# Patient Record
Sex: Female | Born: 1975
Health system: Southern US, Community
[De-identification: ages and names within clinical notes are randomized; demographics above are authoritative.]

## PROBLEM LIST (undated history)

## (undated) DIAGNOSIS — H409 Unspecified glaucoma: Secondary | ICD-10-CM

## (undated) DIAGNOSIS — E039 Hypothyroidism, unspecified: Secondary | ICD-10-CM

## (undated) DIAGNOSIS — K219 Gastro-esophageal reflux disease without esophagitis: Secondary | ICD-10-CM

## (undated) DIAGNOSIS — D649 Anemia, unspecified: Secondary | ICD-10-CM

## (undated) DIAGNOSIS — F419 Anxiety disorder, unspecified: Secondary | ICD-10-CM

## (undated) DIAGNOSIS — K589 Irritable bowel syndrome without diarrhea: Secondary | ICD-10-CM

## (undated) DIAGNOSIS — E079 Disorder of thyroid, unspecified: Secondary | ICD-10-CM

## (undated) DIAGNOSIS — R51 Headache: Secondary | ICD-10-CM

## (undated) DIAGNOSIS — F32A Depression, unspecified: Secondary | ICD-10-CM

## (undated) DIAGNOSIS — N390 Urinary tract infection, site not specified: Secondary | ICD-10-CM

## (undated) DIAGNOSIS — G2581 Restless legs syndrome: Secondary | ICD-10-CM

## (undated) DIAGNOSIS — D352 Benign neoplasm of pituitary gland: Secondary | ICD-10-CM

## (undated) DIAGNOSIS — G4700 Insomnia, unspecified: Secondary | ICD-10-CM

## (undated) DIAGNOSIS — F329 Major depressive disorder, single episode, unspecified: Secondary | ICD-10-CM

## (undated) DIAGNOSIS — E229 Hyperfunction of pituitary gland, unspecified: Secondary | ICD-10-CM

## (undated) HISTORY — DX: Benign neoplasm of pituitary gland: D35.2

## (undated) HISTORY — DX: Hyperfunction of pituitary gland, unspecified: E22.9

## (undated) HISTORY — PX: TONSILLECTOMY: SUR1361

## (undated) HISTORY — PX: TUBAL LIGATION: SHX77

## (undated) HISTORY — DX: Disorder of thyroid, unspecified: E07.9

## (undated) HISTORY — DX: Urinary tract infection, site not specified: N39.0

## (undated) HISTORY — DX: Restless legs syndrome: G25.81

## (undated) HISTORY — DX: Anemia, unspecified: D64.9

## (undated) HISTORY — DX: Hypothyroidism, unspecified: E03.9

## (undated) HISTORY — DX: Major depressive disorder, single episode, unspecified: F32.9

## (undated) HISTORY — DX: Unspecified glaucoma: H40.9

## (undated) HISTORY — DX: Gastro-esophageal reflux disease without esophagitis: K21.9

## (undated) HISTORY — DX: Anxiety disorder, unspecified: F41.9

## (undated) HISTORY — DX: Depression, unspecified: F32.A

## (undated) HISTORY — DX: Insomnia, unspecified: G47.00

## (undated) HISTORY — DX: Irritable bowel syndrome, unspecified: K58.9

## (undated) HISTORY — DX: Headache: R51

## (undated) HISTORY — PX: WISDOM TOOTH EXTRACTION: SHX21

---

## 1994-12-09 HISTORY — PX: NECK SURGERY: SHX720

## 1997-09-01 ENCOUNTER — Inpatient Hospital Stay (HOSPITAL_COMMUNITY): Admission: AD | Admit: 1997-09-01 | Discharge: 1997-09-01 | Payer: Self-pay | Admitting: Obstetrics and Gynecology

## 1997-09-02 ENCOUNTER — Inpatient Hospital Stay (HOSPITAL_COMMUNITY): Admission: AD | Admit: 1997-09-02 | Discharge: 1997-09-02 | Payer: Self-pay | Admitting: Obstetrics and Gynecology

## 1997-10-01 ENCOUNTER — Inpatient Hospital Stay (HOSPITAL_COMMUNITY): Admission: AD | Admit: 1997-10-01 | Discharge: 1997-10-01 | Payer: Self-pay | Admitting: Obstetrics and Gynecology

## 1997-10-23 ENCOUNTER — Inpatient Hospital Stay (HOSPITAL_COMMUNITY): Admission: AD | Admit: 1997-10-23 | Discharge: 1997-10-23 | Payer: Self-pay | Admitting: Obstetrics and Gynecology

## 1997-10-29 ENCOUNTER — Inpatient Hospital Stay (HOSPITAL_COMMUNITY): Admission: AD | Admit: 1997-10-29 | Discharge: 1997-10-29 | Payer: Self-pay | Admitting: *Deleted

## 1997-11-10 ENCOUNTER — Inpatient Hospital Stay (HOSPITAL_COMMUNITY): Admission: AD | Admit: 1997-11-10 | Discharge: 1997-11-10 | Payer: Self-pay | Admitting: Obstetrics and Gynecology

## 1997-11-19 ENCOUNTER — Inpatient Hospital Stay (HOSPITAL_COMMUNITY): Admission: AD | Admit: 1997-11-19 | Discharge: 1997-11-21 | Payer: Self-pay | Admitting: Obstetrics and Gynecology

## 1997-12-04 ENCOUNTER — Inpatient Hospital Stay (HOSPITAL_COMMUNITY): Admission: AD | Admit: 1997-12-04 | Discharge: 1997-12-04 | Payer: Self-pay | Admitting: Obstetrics & Gynecology

## 1997-12-10 ENCOUNTER — Inpatient Hospital Stay (HOSPITAL_COMMUNITY): Admission: AD | Admit: 1997-12-10 | Discharge: 1997-12-10 | Payer: Self-pay | Admitting: Obstetrics and Gynecology

## 1997-12-22 ENCOUNTER — Inpatient Hospital Stay (HOSPITAL_COMMUNITY): Admission: AD | Admit: 1997-12-22 | Discharge: 1997-12-24 | Payer: Self-pay | Admitting: Obstetrics and Gynecology

## 1998-02-06 ENCOUNTER — Other Ambulatory Visit: Admission: RE | Admit: 1998-02-06 | Discharge: 1998-02-06 | Payer: Self-pay | Admitting: Obstetrics and Gynecology

## 1998-05-08 ENCOUNTER — Emergency Department (HOSPITAL_COMMUNITY): Admission: EM | Admit: 1998-05-08 | Discharge: 1998-05-09 | Payer: Self-pay | Admitting: Emergency Medicine

## 1998-05-09 ENCOUNTER — Emergency Department (HOSPITAL_COMMUNITY): Admission: EM | Admit: 1998-05-09 | Discharge: 1998-05-10 | Payer: Self-pay

## 1998-05-09 ENCOUNTER — Encounter: Payer: Self-pay | Admitting: Emergency Medicine

## 1998-05-10 ENCOUNTER — Encounter: Payer: Self-pay | Admitting: Emergency Medicine

## 1998-05-10 ENCOUNTER — Ambulatory Visit (HOSPITAL_COMMUNITY): Admission: RE | Admit: 1998-05-10 | Discharge: 1998-05-10 | Payer: Self-pay | Admitting: Emergency Medicine

## 1998-05-10 ENCOUNTER — Encounter: Payer: Self-pay | Admitting: Gastroenterology

## 1998-05-12 ENCOUNTER — Inpatient Hospital Stay (HOSPITAL_COMMUNITY): Admission: AD | Admit: 1998-05-12 | Discharge: 1998-05-15 | Payer: Self-pay | Admitting: Internal Medicine

## 1998-05-12 ENCOUNTER — Encounter: Payer: Self-pay | Admitting: Internal Medicine

## 1998-05-13 ENCOUNTER — Encounter: Payer: Self-pay | Admitting: Surgery

## 1998-05-14 ENCOUNTER — Encounter: Payer: Self-pay | Admitting: Internal Medicine

## 1999-03-30 ENCOUNTER — Other Ambulatory Visit: Admission: RE | Admit: 1999-03-30 | Discharge: 1999-03-30 | Payer: Self-pay | Admitting: Obstetrics and Gynecology

## 1999-10-07 ENCOUNTER — Observation Stay (HOSPITAL_COMMUNITY): Admission: AD | Admit: 1999-10-07 | Discharge: 1999-10-08 | Payer: Self-pay | Admitting: Obstetrics and Gynecology

## 1999-11-17 ENCOUNTER — Inpatient Hospital Stay (HOSPITAL_COMMUNITY): Admission: AD | Admit: 1999-11-17 | Discharge: 1999-11-17 | Payer: Self-pay | Admitting: Obstetrics and Gynecology

## 1999-12-05 ENCOUNTER — Inpatient Hospital Stay (HOSPITAL_COMMUNITY): Admission: AD | Admit: 1999-12-05 | Discharge: 1999-12-07 | Payer: Self-pay | Admitting: Obstetrics & Gynecology

## 2000-08-02 ENCOUNTER — Emergency Department (HOSPITAL_COMMUNITY): Admission: EM | Admit: 2000-08-02 | Discharge: 2000-08-02 | Payer: Self-pay | Admitting: Emergency Medicine

## 2000-11-19 ENCOUNTER — Encounter: Payer: Self-pay | Admitting: Otolaryngology

## 2000-11-19 ENCOUNTER — Encounter: Admission: RE | Admit: 2000-11-19 | Discharge: 2000-11-19 | Payer: Self-pay | Admitting: Otolaryngology

## 2001-02-26 ENCOUNTER — Other Ambulatory Visit: Admission: RE | Admit: 2001-02-26 | Discharge: 2001-02-26 | Payer: Self-pay | Admitting: Obstetrics and Gynecology

## 2001-11-06 ENCOUNTER — Encounter: Payer: Self-pay | Admitting: Gastroenterology

## 2001-11-06 ENCOUNTER — Encounter: Admission: RE | Admit: 2001-11-06 | Discharge: 2001-11-06 | Payer: Self-pay | Admitting: Gastroenterology

## 2001-12-30 ENCOUNTER — Encounter: Admission: RE | Admit: 2001-12-30 | Discharge: 2001-12-30 | Payer: Self-pay | Admitting: Internal Medicine

## 2001-12-30 ENCOUNTER — Encounter: Payer: Self-pay | Admitting: Internal Medicine

## 2002-01-05 ENCOUNTER — Encounter: Admission: RE | Admit: 2002-01-05 | Discharge: 2002-03-23 | Payer: Self-pay | Admitting: Internal Medicine

## 2002-04-06 ENCOUNTER — Encounter: Admission: RE | Admit: 2002-04-06 | Discharge: 2002-07-05 | Payer: Self-pay | Admitting: Internal Medicine

## 2002-05-26 ENCOUNTER — Encounter: Payer: Self-pay | Admitting: Family Medicine

## 2002-05-26 ENCOUNTER — Encounter: Admission: RE | Admit: 2002-05-26 | Discharge: 2002-05-26 | Payer: Self-pay | Admitting: Family Medicine

## 2003-07-07 ENCOUNTER — Observation Stay (HOSPITAL_COMMUNITY): Admission: RE | Admit: 2003-07-07 | Discharge: 2003-07-08 | Payer: Self-pay | Admitting: Gastroenterology

## 2003-10-05 ENCOUNTER — Other Ambulatory Visit: Admission: RE | Admit: 2003-10-05 | Discharge: 2003-10-05 | Payer: Self-pay | Admitting: Internal Medicine

## 2004-01-02 ENCOUNTER — Ambulatory Visit (HOSPITAL_BASED_OUTPATIENT_CLINIC_OR_DEPARTMENT_OTHER): Admission: RE | Admit: 2004-01-02 | Discharge: 2004-01-02 | Payer: Self-pay | Admitting: Otolaryngology

## 2004-01-02 ENCOUNTER — Encounter (INDEPENDENT_AMBULATORY_CARE_PROVIDER_SITE_OTHER): Payer: Self-pay | Admitting: *Deleted

## 2004-01-02 ENCOUNTER — Ambulatory Visit (HOSPITAL_COMMUNITY): Admission: RE | Admit: 2004-01-02 | Discharge: 2004-01-02 | Payer: Self-pay | Admitting: Otolaryngology

## 2005-02-28 ENCOUNTER — Encounter: Admission: RE | Admit: 2005-02-28 | Discharge: 2005-02-28 | Payer: Self-pay | Admitting: Gastroenterology

## 2005-03-28 ENCOUNTER — Ambulatory Visit: Payer: Self-pay | Admitting: Family Medicine

## 2005-04-01 ENCOUNTER — Ambulatory Visit: Payer: Self-pay | Admitting: Cardiology

## 2005-04-16 ENCOUNTER — Ambulatory Visit: Payer: Self-pay | Admitting: Family Medicine

## 2005-04-24 ENCOUNTER — Ambulatory Visit: Payer: Self-pay | Admitting: Family Medicine

## 2005-05-07 ENCOUNTER — Ambulatory Visit: Payer: Self-pay | Admitting: Family Medicine

## 2005-07-15 ENCOUNTER — Ambulatory Visit: Payer: Self-pay | Admitting: Family Medicine

## 2005-08-01 ENCOUNTER — Ambulatory Visit: Payer: Self-pay | Admitting: Internal Medicine

## 2005-08-26 ENCOUNTER — Encounter (INDEPENDENT_AMBULATORY_CARE_PROVIDER_SITE_OTHER): Payer: Self-pay | Admitting: Specialist

## 2005-08-26 ENCOUNTER — Ambulatory Visit: Payer: Self-pay | Admitting: Internal Medicine

## 2005-09-25 ENCOUNTER — Ambulatory Visit: Payer: Self-pay | Admitting: Internal Medicine

## 2005-10-01 ENCOUNTER — Ambulatory Visit: Payer: Self-pay | Admitting: Family Medicine

## 2005-11-26 ENCOUNTER — Ambulatory Visit: Payer: Self-pay | Admitting: Family Medicine

## 2005-11-26 ENCOUNTER — Other Ambulatory Visit: Admission: RE | Admit: 2005-11-26 | Discharge: 2005-11-26 | Payer: Self-pay | Admitting: Family Medicine

## 2005-11-26 ENCOUNTER — Encounter: Payer: Self-pay | Admitting: Family Medicine

## 2005-12-23 ENCOUNTER — Observation Stay (HOSPITAL_COMMUNITY): Admission: EM | Admit: 2005-12-23 | Discharge: 2005-12-23 | Payer: Self-pay | Admitting: Emergency Medicine

## 2005-12-23 ENCOUNTER — Ambulatory Visit: Payer: Self-pay | Admitting: Endocrinology

## 2006-04-25 ENCOUNTER — Ambulatory Visit: Payer: Self-pay | Admitting: Family Medicine

## 2006-05-09 ENCOUNTER — Ambulatory Visit: Payer: Self-pay | Admitting: Family Medicine

## 2006-07-11 ENCOUNTER — Ambulatory Visit: Payer: Self-pay | Admitting: Family Medicine

## 2006-10-03 ENCOUNTER — Ambulatory Visit (HOSPITAL_COMMUNITY): Admission: RE | Admit: 2006-10-03 | Discharge: 2006-10-03 | Payer: Self-pay | Admitting: Obstetrics and Gynecology

## 2006-10-03 ENCOUNTER — Encounter (INDEPENDENT_AMBULATORY_CARE_PROVIDER_SITE_OTHER): Payer: Self-pay | Admitting: Specialist

## 2007-01-17 ENCOUNTER — Emergency Department (HOSPITAL_COMMUNITY): Admission: EM | Admit: 2007-01-17 | Discharge: 2007-01-17 | Payer: Self-pay | Admitting: Family Medicine

## 2007-04-11 HISTORY — PX: ABDOMINAL HYSTERECTOMY: SHX81

## 2007-04-16 ENCOUNTER — Inpatient Hospital Stay (HOSPITAL_COMMUNITY): Admission: RE | Admit: 2007-04-16 | Discharge: 2007-04-18 | Payer: Self-pay | Admitting: Obstetrics and Gynecology

## 2007-04-16 ENCOUNTER — Encounter (INDEPENDENT_AMBULATORY_CARE_PROVIDER_SITE_OTHER): Payer: Self-pay | Admitting: Obstetrics and Gynecology

## 2007-11-03 ENCOUNTER — Emergency Department (HOSPITAL_COMMUNITY): Admission: EM | Admit: 2007-11-03 | Discharge: 2007-11-03 | Payer: Self-pay | Admitting: Family Medicine

## 2008-01-15 ENCOUNTER — Ambulatory Visit: Payer: Self-pay | Admitting: Family Medicine

## 2008-01-15 DIAGNOSIS — G4700 Insomnia, unspecified: Secondary | ICD-10-CM

## 2008-01-15 DIAGNOSIS — R51 Headache: Secondary | ICD-10-CM | POA: Insufficient documentation

## 2008-01-15 DIAGNOSIS — R519 Headache, unspecified: Secondary | ICD-10-CM | POA: Insufficient documentation

## 2008-01-15 DIAGNOSIS — Z9189 Other specified personal risk factors, not elsewhere classified: Secondary | ICD-10-CM | POA: Insufficient documentation

## 2008-01-15 DIAGNOSIS — F411 Generalized anxiety disorder: Secondary | ICD-10-CM | POA: Insufficient documentation

## 2008-01-15 DIAGNOSIS — N390 Urinary tract infection, site not specified: Secondary | ICD-10-CM

## 2008-01-15 DIAGNOSIS — K219 Gastro-esophageal reflux disease without esophagitis: Secondary | ICD-10-CM

## 2008-02-04 ENCOUNTER — Ambulatory Visit: Payer: Self-pay | Admitting: Licensed Clinical Social Worker

## 2008-02-25 ENCOUNTER — Ambulatory Visit: Payer: Self-pay | Admitting: Family Medicine

## 2008-02-25 DIAGNOSIS — R1032 Left lower quadrant pain: Secondary | ICD-10-CM

## 2008-02-25 DIAGNOSIS — K589 Irritable bowel syndrome without diarrhea: Secondary | ICD-10-CM | POA: Insufficient documentation

## 2008-02-25 LAB — CONVERTED CEMR LAB
Protein, U semiquant: NEGATIVE
Specific Gravity, Urine: >=1.03

## 2008-02-26 ENCOUNTER — Encounter: Admission: RE | Admit: 2008-02-26 | Discharge: 2008-02-26 | Payer: Self-pay | Admitting: Family Medicine

## 2008-04-26 ENCOUNTER — Ambulatory Visit: Payer: Self-pay | Admitting: Family Medicine

## 2008-04-26 DIAGNOSIS — M461 Sacroiliitis, not elsewhere classified: Secondary | ICD-10-CM

## 2008-05-30 ENCOUNTER — Ambulatory Visit: Payer: Self-pay | Admitting: Family Medicine

## 2008-05-30 ENCOUNTER — Telehealth: Payer: Self-pay | Admitting: Family Medicine

## 2008-06-07 ENCOUNTER — Ambulatory Visit: Payer: Self-pay | Admitting: Family Medicine

## 2008-06-07 LAB — CONVERTED CEMR LAB
Bilirubin Urine: NEGATIVE
Ketones, urine, test strip: NEGATIVE
Nitrite: NEGATIVE
Urobilinogen, UA: 0.2
pH: 5

## 2008-06-09 LAB — CONVERTED CEMR LAB
AST: 14 units/L (ref 0–37)
Albumin: 3.8 g/dL (ref 3.5–5.2)
BUN: 11 mg/dL (ref 6–23)
Basophils Absolute: 0 10*3/uL (ref 0.0–0.1)
Calcium: 9.5 mg/dL (ref 8.4–10.5)
Cholesterol: 229 mg/dL (ref 0–200)
Eosinophils Absolute: 0.1 10*3/uL (ref 0.0–0.7)
GFR calc Af Amer: 124 mL/min
GFR calc non Af Amer: 102 mL/min
HCT: 39.8 % (ref 36.0–46.0)
Hemoglobin: 13.4 g/dL (ref 12.0–15.0)
Lymphocytes Relative: 37.4 % (ref 12.0–46.0)
MCHC: 33.6 g/dL (ref 30.0–36.0)
Neutrophils Relative %: 50.4 % (ref 43.0–77.0)
Potassium: 4.3 meq/L (ref 3.5–5.1)
RDW: 11.7 % (ref 11.5–14.6)
TSH: 3.74 microintl units/mL (ref 0.35–5.50)
Total Bilirubin: 0.6 mg/dL (ref 0.3–1.2)
Total CHOL/HDL Ratio: 3.3
VLDL: 25 mg/dL (ref 0–40)
WBC: 3.3 10*3/uL — ABNORMAL LOW (ref 4.5–10.5)

## 2008-06-14 ENCOUNTER — Ambulatory Visit: Payer: Self-pay | Admitting: Family Medicine

## 2009-01-13 ENCOUNTER — Encounter: Payer: Self-pay | Admitting: Family Medicine

## 2009-02-01 ENCOUNTER — Telehealth: Payer: Self-pay | Admitting: Family Medicine

## 2009-02-10 ENCOUNTER — Ambulatory Visit: Payer: Self-pay | Admitting: Family Medicine

## 2009-02-10 DIAGNOSIS — J309 Allergic rhinitis, unspecified: Secondary | ICD-10-CM | POA: Insufficient documentation

## 2009-06-26 ENCOUNTER — Encounter: Admission: RE | Admit: 2009-06-26 | Discharge: 2009-06-26 | Payer: Self-pay | Admitting: Obstetrics and Gynecology

## 2010-02-06 ENCOUNTER — Ambulatory Visit: Payer: Self-pay | Admitting: Family Medicine

## 2010-02-06 DIAGNOSIS — J019 Acute sinusitis, unspecified: Secondary | ICD-10-CM | POA: Insufficient documentation

## 2010-04-18 ENCOUNTER — Ambulatory Visit: Payer: Self-pay | Admitting: Family Medicine

## 2010-04-18 DIAGNOSIS — R55 Syncope and collapse: Secondary | ICD-10-CM

## 2010-04-20 LAB — CONVERTED CEMR LAB
Alkaline Phosphatase: 46 units/L (ref 39–117)
BUN: 12 mg/dL (ref 6–23)
Basophils Absolute: 0 10*3/uL (ref 0.0–0.1)
Bilirubin, Direct: 0 mg/dL (ref 0.0–0.3)
Calcium: 9.5 mg/dL (ref 8.4–10.5)
Chloride: 108 meq/L (ref 96–112)
Creatinine, Ser: 0.8 mg/dL (ref 0.4–1.2)
Eosinophils Absolute: 0 10*3/uL (ref 0.0–0.7)
Eosinophils Relative: 0.8 % (ref 0.0–5.0)
Glucose, Bld: 83 mg/dL (ref 70–99)
HCT: 36.7 % (ref 36.0–46.0)
Monocytes Relative: 7.2 % (ref 3.0–12.0)
Neutro Abs: 2.4 10*3/uL (ref 1.4–7.7)
Neutrophils Relative %: 54.6 % (ref 43.0–77.0)
Platelets: 213 10*3/uL (ref 150.0–400.0)
TSH: 1.74 microintl units/mL (ref 0.35–5.50)
Total Bilirubin: 0.3 mg/dL (ref 0.3–1.2)

## 2010-07-02 ENCOUNTER — Encounter: Payer: Self-pay | Admitting: Family Medicine

## 2010-07-10 NOTE — Assessment & Plan Note (Signed)
Summary: CONGESTION // RS   Vital Signs:  Patient profile:   35 year old female Weight:      161 pounds BMI:     24.57 O2 Sat:      99 % Temp:     98.7 degrees F Pulse rate:   98 / minute BP sitting:   110 / 78  (left arm)  Vitals Entered By: Pura Spice, RN (February 06, 2010 11:19 AM) CC: sinus congestion cough Is Patient Diabetic? No   History of Present Illness: Here for one week of sinus pressure, HA, ST, and a dry cough. No fever.   Allergies: 1)  ! Penicillin 2)  ! * Dilaudid 3)  ! * Mgso4 4)  ! * Ct Scan Dye  Past History:  Past Medical History: Reviewed history from 06/14/2008 and no changes required. IBS, sees Dr. Marina Goodell Depression GERD restless legs insomnia Headache frequent UTI's, sees Dr. Vernie Ammons sees Dr. Duane Lope for GYN exams  Review of Systems  The patient denies anorexia, fever, weight loss, weight gain, vision loss, decreased hearing, hoarseness, chest pain, syncope, dyspnea on exertion, peripheral edema, hemoptysis, abdominal pain, melena, hematochezia, severe indigestion/heartburn, hematuria, incontinence, genital sores, muscle weakness, suspicious skin lesions, transient blindness, difficulty walking, depression, unusual weight change, abnormal bleeding, enlarged lymph nodes, angioedema, breast masses, and testicular masses.    Physical Exam  General:  Well-developed,well-nourished,in no acute distress; alert,appropriate and cooperative throughout examination Head:  Normocephalic and atraumatic without obvious abnormalities. No apparent alopecia or balding. Eyes:  No corneal or conjunctival inflammation noted. EOMI. Perrla. Funduscopic exam benign, without hemorrhages, exudates or papilledema. Vision grossly normal. Ears:  External ear exam shows no significant lesions or deformities.  Otoscopic examination reveals clear canals, tympanic membranes are intact bilaterally without bulging, retraction, inflammation or discharge. Hearing is grossly  normal bilaterally. Nose:  External nasal examination shows no deformity or inflammation. Nasal mucosa are pink and moist without lesions or exudates. Mouth:  Oral mucosa and oropharynx without lesions or exudates.  Teeth in good repair. Neck:  No deformities, masses, or tenderness noted. Lungs:  Normal respiratory effort, chest expands symmetrically. Lungs are clear to auscultation, no crackles or wheezes.   Impression & Recommendations:  Problem # 1:  ACUTE SINUSITIS, UNSPECIFIED (ICD-461.9)  The following medications were removed from the medication list:    Flonase 50 Mcg/act Susp (Fluticasone propionate) .Marland Kitchen... 2 sprays each nostril once daily    Zyrtec-d Allergy & Congestion 5-120 Mg Xr12h-tab (Cetirizine-pseudoephedrine) .Marland Kitchen..Marland Kitchen Two times a day Her updated medication list for this problem includes:    Zithromax Z-pak 250 Mg Tabs (Azithromycin) .Marland Kitchen... As directed    Hydromet 5-1.5 Mg/40ml Syrp (Hydrocodone-homatropine) .Marland Kitchen... 1 tsp q 4 hours as needed cough  Complete Medication List: 1)  Temazepam 30 Mg Caps (Temazepam) .... At bedtime as needed 2)  Omeprazole 20 Mg Cpdr (Omeprazole) .... Once daily 3)  Bentyl 20 Mg Tabs (Dicyclomine hcl) .... Three times a day as needed spasm 4)  Ortho Tricyclen Lo  .... Once daily 5)  Ibuprofen 800 Mg Tabs (Ibuprofen) .Marland Kitchen.. 1 every 6 hours as needed pain 6)  Lorazepam 0.5 Mg Tabs (Lorazepam) .Marland Kitchen.. 1 or 2 tabs every 6 hours as needed anxiety 7)  Zithromax Z-pak 250 Mg Tabs (Azithromycin) .... As directed 8)  Fluconazole 150 Mg Tabs (Fluconazole) .... As needed 9)  Hydromet 5-1.5 Mg/27ml Syrp (Hydrocodone-homatropine) .Marland Kitchen.. 1 tsp q 4 hours as needed cough  Patient Instructions: 1)  Please schedule a follow-up appointment as  needed .  Prescriptions: HYDROMET 5-1.5 MG/5ML SYRP (HYDROCODONE-HOMATROPINE) 1 tsp q 4 hours as needed cough  #240 x 0   Entered and Authorized by:   Nelwyn Salisbury MD   Signed by:   Nelwyn Salisbury MD on 02/06/2010   Method used:    Print then Give to Patient   RxID:   [id number] FLUCONAZOLE 150 MG TABS (FLUCONAZOLE) as needed  #1 x 5   Entered and Authorized by:   Nelwyn Salisbury MD   Signed by:   Nelwyn Salisbury MD on 02/06/2010   Method used:   Print then Give to Patient   RxID:   [id number] QIONGEXBM Z-PAK 250 MG TABS (AZITHROMYCIN) as directed  #1 x 0   Entered and Authorized by:   Nelwyn Salisbury MD   Signed by:   Nelwyn Salisbury MD on 02/06/2010   Method used:   Print then Give to Patient   RxID:   [id number]

## 2010-07-10 NOTE — Assessment & Plan Note (Signed)
Summary: CONSULT RE: PERIODIC FAINTING SPELLS/CJR   Vital Signs:  Patient profile:   35 year old female O2 Sat:      98 % Temp:     98.5 degrees F Pulse rate:   87 / minute BP sitting:   120 / 70  (left arm) Cuff size:   regular  Vitals Entered By: Pura Spice, RN (April 18, 2010 4:06 PM) CC: states passed out SAt around midnite fell hit head felt nauseaous and saw "floaters in front of eyes" states before and after episode can't speak or even move  states did not seek any medical attention    History of Present Illness: Here for several episodes recently of feeling lightheaded or dizzy, and several times she has briefly passed out. About 5 days ago she was spending the night in a hotel room, and she got up in the middle of the night to use the bathroom. After she was done, she walked back to the bed and suddenly felt weak, flushed, and a bit nauseated. She then passed out and fell, landing on a carpeted floor. her head did hit the floor but she does not think there was any serious injury. She has felt a bit lightheaded since then and has had a mild HA. She has gone to work every day this week. She has not been on any new meds recently. No vision changes.   Allergies: 1)  ! Penicillin 2)  ! * Dilaudid 3)  ! * Mgso4 4)  ! * Ct Scan Dye  Past History:  Past Medical History: Reviewed history from 06/14/2008 and no changes required. IBS, sees Dr. Marina Goodell Depression GERD restless legs insomnia Headache frequent UTI's, sees Dr. Vernie Ammons sees Dr. Duane Lope for GYN exams  Past Surgical History: Reviewed history from 02/25/2008 and no changes required. colonoscopy 08-26-05 per Dr. Marina Goodell, normal EGD 08-27-07 showed GERD Tonsillectomy Hysterectomy, per Dr. Tenny Craw  Review of Systems  The patient denies anorexia, fever, weight loss, weight gain, vision loss, decreased hearing, hoarseness, chest pain, syncope, dyspnea on exertion, peripheral edema, prolonged cough, hemoptysis,  abdominal pain, melena, hematochezia, severe indigestion/heartburn, hematuria, incontinence, genital sores, muscle weakness, suspicious skin lesions, transient blindness, difficulty walking, depression, unusual weight change, abnormal bleeding, enlarged lymph nodes, angioedema, breast masses, and testicular masses.    Physical Exam  General:  Well-developed,well-nourished,in no acute distress; alert,appropriate and cooperative throughout examination Head:  Normocephalic and atraumatic without obvious abnormalities. No apparent alopecia or balding. Eyes:  No corneal or conjunctival inflammation noted. EOMI. Perrla. Funduscopic exam benign, without hemorrhages, exudates or papilledema. Vision grossly normal. Ears:  External ear exam shows no significant lesions or deformities.  Otoscopic examination reveals clear canals, tympanic membranes are intact bilaterally without bulging, retraction, inflammation or discharge. Hearing is grossly normal bilaterally. Nose:  External nasal examination shows no deformity or inflammation. Nasal mucosa are pink and moist without lesions or exudates. Mouth:  Oral mucosa and oropharynx without lesions or exudates.  Teeth in good repair. Neck:  No deformities, masses, or tenderness noted. Lungs:  Normal respiratory effort, chest expands symmetrically. Lungs are clear to auscultation, no crackles or wheezes. Heart:  Normal rate and regular rhythm. S1 and S2 normal without gallop, murmur, click, rub or other extra sounds. Psych:  Cognition and judgment appear intact. Alert and cooperative with normal attention span and concentration. No apparent delusions, illusions, hallucinations   Impression & Recommendations:  Problem # 1:  SYNCOPE (ICD-780.2)  Orders: Venipuncture (30160) TLB-BMP (Basic Metabolic Panel-BMET) (  80048-METABOL) TLB-CBC Platelet - w/Differential (85025-CBCD) TLB-Hepatic/Liver Function Pnl (80076-HEPATIC) TLB-TSH (Thyroid Stimulating Hormone)  (84443-TSH) TLB-B12, Serum-Total ONLY (16109-U04) Urinalysis-dipstick only (Medicare patient) (54098JX)  Problem # 2:  HEADACHE (ICD-784.0)  Her updated medication list for this problem includes:    Ibuprofen 800 Mg Tabs (Ibuprofen) .Marland Kitchen... 1 every 6 hours as needed pain  Complete Medication List: 1)  Temazepam 30 Mg Caps (Temazepam) .... At bedtime as needed 2)  Omeprazole 20 Mg Cpdr (Omeprazole) .... Once daily 3)  Bentyl 20 Mg Tabs (Dicyclomine hcl) .... Three times a day as needed spasm 4)  Ortho Tricyclen Lo  .... Once daily 5)  Ibuprofen 800 Mg Tabs (Ibuprofen) .Marland Kitchen.. 1 every 6 hours as needed pain 6)  Lorazepam 0.5 Mg Tabs (Lorazepam) .Marland Kitchen.. 1 or 2 tabs every 6 hours as needed anxiety 7)  Fluconazole 150 Mg Tabs (Fluconazole) .... As needed  Patient Instructions: 1)  This sounds like a vasovagal incident. Drink plenty of fluids. get labs today  Prescriptions: LORAZEPAM 0.5 MG TABS (LORAZEPAM) 1 or 2 tabs every 6 hours as needed anxiety  #60 x 5   Entered and Authorized by:   Nelwyn Salisbury MD   Signed by:   Nelwyn Salisbury MD on 04/18/2010   Method used:   Print then Give to Patient   RxID:   [id number] BENTYL 20 MG TABS (DICYCLOMINE HCL) three times a day as needed spasm  #90 x 5   Entered and Authorized by:   Nelwyn Salisbury MD   Signed by:   Nelwyn Salisbury MD on 04/18/2010   Method used:   Print then Give to Patient   RxID:   [id number] TEMAZEPAM 30 MG  CAPS (TEMAZEPAM) at bedtime as needed  #30 x 5   Entered and Authorized by:   Nelwyn Salisbury MD   Signed by:   Nelwyn Salisbury MD on 04/18/2010   Method used:   Print then Give to Patient   RxID:   [id number]    Orders Added: 1)  Est. Patient Level IV [74259] 2)  Venipuncture [56387] 3)  TLB-BMP (Basic Metabolic Panel-BMET) [80048-METABOL] 4)  TLB-CBC Platelet - w/Differential [85025-CBCD] 5)  TLB-Hepatic/Liver Function Pnl [80076-HEPATIC] 6)  TLB-TSH (Thyroid Stimulating Hormone) [84443-TSH] 7)  TLB-B12,  Serum-Total ONLY [82607-B12] 8)  Urinalysis-dipstick only (Medicare patient) [81003QW]  Appended Document: CONSULT RE: PERIODIC FAINTING SPELLS/CJR   Appended Document: CONSULT RE: PERIODIC FAINTING SPELLS/CJR  Laboratory Results   Urine Tests    Routine Urinalysis   Color: yellow Appearance: Clear Glucose: negative   (Normal Range: Negative) Bilirubin: negative   (Normal Range: Negative) Ketone: negative   (Normal Range: Negative) Spec. Gravity: 1.015   (Normal Range: 1.003-1.035) Blood: negative   (Normal Range: Negative) pH: 6.5   (Normal Range: 5.0-8.0) Protein: negative   (Normal Range: Negative) Urobilinogen: 0.2   (Normal Range: 0-1) Nitrite: negative   (Normal Range: Negative) Leukocyte Esterace: negative   (Normal Range: Negative)    Comments: Rita Ohara  April 18, 2010 5:09 PM

## 2010-07-18 ENCOUNTER — Telehealth: Payer: Self-pay | Admitting: *Deleted

## 2010-07-18 NOTE — Telephone Encounter (Signed)
Yes this is a good choice for her to try. Safe and cheap

## 2010-07-18 NOTE — Telephone Encounter (Signed)
Notified pt. 

## 2010-07-18 NOTE — Telephone Encounter (Signed)
See below

## 2010-07-18 NOTE — Telephone Encounter (Signed)
Pt was prescribed Metoprolol for migraine headaches and wants to know if Dr. Clent Ridges feels she should be taking this?

## 2010-08-24 ENCOUNTER — Inpatient Hospital Stay (INDEPENDENT_AMBULATORY_CARE_PROVIDER_SITE_OTHER)
Admission: RE | Admit: 2010-08-24 | Discharge: 2010-08-24 | Disposition: A | Discharge status: Home / Self Care | Payer: Managed Care, Other (non HMO) | Source: Ambulatory Visit | Attending: Family Medicine | Admitting: Family Medicine

## 2010-08-24 ENCOUNTER — Ambulatory Visit (INDEPENDENT_AMBULATORY_CARE_PROVIDER_SITE_OTHER): Payer: Managed Care, Other (non HMO)

## 2010-08-24 DIAGNOSIS — K5289 Other specified noninfective gastroenteritis and colitis: Secondary | ICD-10-CM

## 2010-08-24 LAB — CBC
HCT: 36.8 % (ref 36.0–46.0)
Hemoglobin: 12.3 g/dL (ref 12.0–15.0)
MCHC: 33.4 g/dL (ref 30.0–36.0)
MCV: 88.5 fL (ref 78.0–100.0)

## 2010-08-24 LAB — POCT URINALYSIS DIP (DEVICE)
Bilirubin Urine: NEGATIVE
Ketones, ur: NEGATIVE mg/dL
Nitrite: NEGATIVE
Protein, ur: NEGATIVE mg/dL
Urobilinogen, UA: 0.2 mg/dL (ref 0.0–1.0)
pH: 6 (ref 5.0–8.0)

## 2010-08-24 LAB — COMPREHENSIVE METABOLIC PANEL
ALT: 14 U/L (ref 0–35)
AST: 16 U/L (ref 0–37)
Albumin: 4 g/dL (ref 3.5–5.2)
BUN: 12 mg/dL (ref 6–23)
CO2: 26 mEq/L (ref 19–32)
Calcium: 9.3 mg/dL (ref 8.4–10.5)
Creatinine, Ser: 0.72 mg/dL (ref 0.4–1.2)
GFR calc non Af Amer: >60 mL/min (ref >60)
Glucose, Bld: 99 mg/dL (ref 70–99)
Potassium: 4.2 mEq/L (ref 3.5–5.1)
Sodium: 138 mEq/L (ref 135–145)

## 2010-08-24 LAB — DIFFERENTIAL
Basophils Absolute: 0 10*3/uL (ref 0.0–0.1)
Basophils Relative: 1 % (ref 0–1)
Eosinophils Absolute: 0.1 10*3/uL (ref 0.0–0.7)
Lymphocytes Relative: 41 % (ref 12–46)

## 2010-08-24 LAB — POCT PREGNANCY, URINE: Preg Test, Ur: NEGATIVE

## 2010-08-25 ENCOUNTER — Ambulatory Visit: Payer: Self-pay | Admitting: Family Medicine

## 2010-08-27 ENCOUNTER — Encounter: Payer: Self-pay | Admitting: Family Medicine

## 2010-08-28 ENCOUNTER — Encounter: Payer: Self-pay | Admitting: Family Medicine

## 2010-08-30 ENCOUNTER — Ambulatory Visit (INDEPENDENT_AMBULATORY_CARE_PROVIDER_SITE_OTHER): Payer: Managed Care, Other (non HMO) | Admitting: Family Medicine

## 2010-08-30 ENCOUNTER — Encounter: Payer: Self-pay | Admitting: Family Medicine

## 2010-08-30 VITALS — BP 104/60 | HR 90 | Temp 98.8°F | Wt 160.0 lb

## 2010-08-30 DIAGNOSIS — K219 Gastro-esophageal reflux disease without esophagitis: Secondary | ICD-10-CM

## 2010-08-30 DIAGNOSIS — K589 Irritable bowel syndrome without diarrhea: Secondary | ICD-10-CM

## 2010-08-30 MED ORDER — FLUCONAZOLE 150 MG PO TABS: 150.0000 mg | ORAL_TABLET | Freq: Once | ORAL | Status: AC

## 2010-08-30 MED ORDER — OMEPRAZOLE 40 MG PO CPDR: 40.0000 mg | DELAYED_RELEASE_CAPSULE | Freq: Every day | ORAL | Status: DC

## 2010-08-30 NOTE — Progress Notes (Signed)
  Subjective:    Patient ID: Laura [name], female    DOB: [birth year], 35 y.o.   MRN: [id number]  HPI Here to follow up on GI problems. She has a long hx of both GERD and IBS. However she does not take any acid blockers on a regular basis. About 3 weeks ago she began to have daily rumbling in the abdomen with some diarrhea and some cramps. She also passed a lot of flatus and belched more often. Then last week the diarrhea became more of a problem, so she went to Urgent Care. They did a lot of blood tests, including a CBC, and everything looked normal. She never had a fever. She was nauseated but never vomited. They put her on Cipro 500 mg bid , but she is no better.    Review of Systems  Constitutional: Positive for appetite change. Negative for fever, chills, diaphoresis and unexpected weight change.  Gastrointestinal: Positive for nausea, abdominal pain, diarrhea and abdominal distention. Negative for vomiting, constipation, blood in stool, anal bleeding and rectal pain.  Genitourinary: Negative.        Objective:   Physical Exam  Constitutional: She appears well-developed. No distress.  Cardiovascular: Normal rate, regular rhythm, normal heart sounds and intact distal pulses.   Pulmonary/Chest: Effort normal and breath sounds normal.  Abdominal: Soft. Bowel sounds are normal. She exhibits no distension and no mass. There is tenderness. There is no rebound and no guarding.       Mild bilateral lower quadrant tenderness and epigastric tenderness          Assessment & Plan:  This seems to be a flare up of her IBS which is probably exacerbated by untreated GERD. We will increase the Omeprazole to 40 mg a day, and I urged her to take it every day. Stop the Cipro.

## 2010-09-11 ENCOUNTER — Telehealth: Payer: Self-pay | Admitting: Family Medicine

## 2010-09-11 DIAGNOSIS — R109 Unspecified abdominal pain: Secondary | ICD-10-CM

## 2010-09-11 NOTE — Telephone Encounter (Signed)
Pt is still having abd pain requesting a referral to specialist

## 2010-09-12 ENCOUNTER — Telehealth: Payer: Self-pay | Admitting: Internal Medicine

## 2010-09-12 NOTE — Telephone Encounter (Signed)
Pt called to check on status of getting a referral to specialist. Pt in extreme amount of pain. Pls call with referral info asap. Pt wants to get in to see specialist today if possible.

## 2010-09-12 NOTE — Telephone Encounter (Signed)
Left mess on mobile phone that Laura [name] will be making this referral and she may call back and ck with her regarding status and I will let Terri know to get her in ASAP

## 2010-09-12 NOTE — Telephone Encounter (Signed)
Terry aware of Dr. Lamar Sprinkles recommendations.

## 2010-09-12 NOTE — Telephone Encounter (Signed)
Make a referral to GI for chronic abdominal pain

## 2010-09-12 NOTE — Telephone Encounter (Signed)
Pt scheduled to see Mike Gip PA 09/17/10@10 :Donnetta Hutching to notify pt of appt date and time.

## 2010-09-12 NOTE — Telephone Encounter (Signed)
Left message for Laura [name] to call back.

## 2010-09-12 NOTE — Telephone Encounter (Signed)
Yes. However, needs to go to ER if problem significantly worsens in the interim

## 2010-09-17 ENCOUNTER — Ambulatory Visit (INDEPENDENT_AMBULATORY_CARE_PROVIDER_SITE_OTHER): Payer: 59 | Admitting: Physician Assistant

## 2010-09-17 ENCOUNTER — Encounter: Payer: Self-pay | Admitting: Physician Assistant

## 2010-09-17 ENCOUNTER — Other Ambulatory Visit (INDEPENDENT_AMBULATORY_CARE_PROVIDER_SITE_OTHER): Payer: 59

## 2010-09-17 DIAGNOSIS — K589 Irritable bowel syndrome without diarrhea: Secondary | ICD-10-CM

## 2010-09-17 DIAGNOSIS — R110 Nausea: Secondary | ICD-10-CM

## 2010-09-17 DIAGNOSIS — R109 Unspecified abdominal pain: Secondary | ICD-10-CM

## 2010-09-17 DIAGNOSIS — R197 Diarrhea, unspecified: Secondary | ICD-10-CM

## 2010-09-17 DIAGNOSIS — R634 Abnormal weight loss: Secondary | ICD-10-CM

## 2010-09-17 LAB — CBC WITH DIFFERENTIAL/PLATELET
Basophils Absolute: 0 10*3/uL (ref 0.0–0.1)
Eosinophils Absolute: 0 10*3/uL (ref 0.0–0.7)
Eosinophils Relative: 0.6 % (ref 0.0–5.0)
HCT: 39.4 % (ref 36.0–46.0)
Lymphocytes Relative: 36.7 % (ref 12.0–46.0)
Lymphs Abs: 1.1 10*3/uL (ref 0.7–4.0)
MCHC: 34.5 g/dL (ref 30.0–36.0)
Monocytes Relative: 8.5 % (ref 3.0–12.0)
Neutro Abs: 1.7 10*3/uL (ref 1.4–7.7)
Neutrophils Relative %: 53.2 % (ref 43.0–77.0)
RDW: 13 % (ref 11.5–14.6)

## 2010-09-17 MED ORDER — ONDANSETRON HCL 4 MG PO TABS: 4.0000 mg | ORAL_TABLET | Freq: Every day | ORAL | Status: DC | PRN

## 2010-09-17 MED ORDER — GLYCOPYRROLATE 2 MG PO TABS: 2.0000 mg | ORAL_TABLET | Freq: Two times a day (BID) | ORAL | Status: DC

## 2010-09-17 NOTE — Progress Notes (Signed)
Subjective:    Patient ID: Laura [name], female    DOB: 02-Jan-1976, 35 y.o.   MRN: [id number]  HPI [name] is a pleasant 35 year old white female known to Dr. Marina Goodell from prior evaluation in 2007. She had undergone upper endoscopy and colonoscopy at that time both of which were negative. She did have small bowel biopsies done as well as random biopsies from the colon and ruled out for celiac disease and microscopic colitis. It was felt at that time that her symptoms were due to irritable bowel. Patient says she had done fairly well with mild intermittent symptoms until about 6 weeks ago when she developed diarrhea abdominal pain gas loud rum rumbling in her abdomen and intermittent cramping. Along with this she has had nausea and a weight loss of 8-10 pounds. Has not had any documented fevers and no vomiting. The diarrhea has been nonbloody. At this point she is actually having diarrhea on some days and other days having no bowel movement. Her diarrhea generally consists of 1-2 very loose stools per day which seems to be a low of a large evacuation of her bowel occurring after eating. She's not eating much because generally it causes pain and diarrhea. She did take antibiotics in January for a sinus infection. She was seen in at urgent care on March 16 and was given an empiric trial of Cipro at that point after having negative abdominal films and unremarkable labs. She saw Dr. Clent Ridges back shortly after that he discontinued to Cipro. She has dicyclomine to use at home but has not been using this regularly because it makes her sleepy. She has been taking omeprazole regularly over the past couple of weeks for increased reflux symptoms. She is concerned at this point because of the weight loss and the persistence of her abdominal pain.    Review of Systems  Constitutional: Positive for appetite change and unexpected weight change.  HENT: Negative.   Eyes: Negative.   Respiratory: Negative.   Cardiovascular:  Negative.   Gastrointestinal: Positive for nausea, abdominal pain, diarrhea and constipation.  Genitourinary: Negative.   Musculoskeletal: Negative.   Skin: Negative.   Neurological: Negative.   Hematological: Negative.   Psychiatric/Behavioral: The patient is nervous/anxious.        Objective:   Physical Exam Well-developed young white female in no acute distress, pleasant, anxious, alert and oriented x3  HEENT; nontraumatic normocephalic EOMI PERRLA sclera  Neck supple no JVD;  Cardiovascular; regular rate and rhythm with S1-S2 no murmur rub or gallop  Pulmonary; clear bilaterally  Abdomen; soft bowel sounds active mildly tender left lower quadrant and suprapubic as well as infra-umbilical. There is no palpable mass or hepatosplenomegaly  Rectal; not done  Skin benign , Psych;mood and affect normal an appropriate.        Assessment & Plan:  #87 35 year old female with history of IBS presenting with a 6 week history of diarrhea gas bloating cramping and weight loss. I suspect she had an infectious gastroenteritis with post infectious exacerbation of her IBS. Previous negative workup in 2007 including endoscopy colonoscopy and random biopsies.  Plan; Will start trial of Align  one by mouth daily Trial of Robinul forte 2 mg one by mouth twice daily instead of bentyl  as this is generally less sedating. Trial of Zofran 4 mg q. 8 hours as needed for nausea Check stool for C. difficile by PCR and stool for wbc's, check a CBC sedimentation rate and CRP Return office visit in 2 weeks  with myself or Dr. Marina Goodell.

## 2010-09-17 NOTE — Progress Notes (Signed)
Reviewed and agree with management. Myia Bergh D. Kalliopi Coupland, M.D., FACG  

## 2010-09-18 ENCOUNTER — Telehealth: Payer: Self-pay | Admitting: *Deleted

## 2010-09-18 NOTE — Telephone Encounter (Signed)
Left a message for patient that labs are normal as per Mike Gip, PA

## 2010-09-18 NOTE — Telephone Encounter (Signed)
Message copied by Jesse Fall on Tue Sep 18, 2010  8:20 AM ------      Message from: [place], [place]      Created: Mon Sep 17, 2010  3:44 PM       Please call pt and let her know her labs are normal.

## 2010-09-20 ENCOUNTER — Other Ambulatory Visit: Payer: Self-pay | Admitting: Physician Assistant

## 2010-09-20 ENCOUNTER — Other Ambulatory Visit: Payer: 59

## 2010-09-20 DIAGNOSIS — R634 Abnormal weight loss: Secondary | ICD-10-CM

## 2010-09-20 DIAGNOSIS — R197 Diarrhea, unspecified: Secondary | ICD-10-CM

## 2010-09-20 DIAGNOSIS — R110 Nausea: Secondary | ICD-10-CM

## 2010-09-21 LAB — FECAL LACTOFERRIN, QUANT: Lactoferrin: NEGATIVE

## 2010-09-22 LAB — CLOSTRIDIUM DIFFICILE BY PCR: Toxigenic C. Difficile by PCR: NOT DETECTED

## 2010-09-24 ENCOUNTER — Telehealth: Payer: Self-pay | Admitting: *Deleted

## 2010-09-24 NOTE — Telephone Encounter (Signed)
Patient given lab results as per Mike Gip, PA. Patient is better. No diarrhea.

## 2010-09-24 NOTE — Telephone Encounter (Signed)
Message copied by Jesse Fall on Mon Sep 24, 2010  2:45 PM ------      Message from: [place], [place]      Created: Mon Sep 24, 2010  1:43 PM       Please let pt know her stool cultures are negative,see how she is feeling?diarrhea?

## 2010-09-24 NOTE — Telephone Encounter (Signed)
Left message for patient to call me

## 2010-10-09 ENCOUNTER — Ambulatory Visit (INDEPENDENT_AMBULATORY_CARE_PROVIDER_SITE_OTHER): Payer: 59 | Admitting: Internal Medicine

## 2010-10-09 ENCOUNTER — Encounter: Payer: Self-pay | Admitting: Internal Medicine

## 2010-10-09 VITALS — BP 120/64 | HR 76 | Ht 68.0 in | Wt 154.0 lb

## 2010-10-09 DIAGNOSIS — K219 Gastro-esophageal reflux disease without esophagitis: Secondary | ICD-10-CM

## 2010-10-09 DIAGNOSIS — K589 Irritable bowel syndrome without diarrhea: Secondary | ICD-10-CM

## 2010-10-09 MED ORDER — DEXLANSOPRAZOLE 60 MG PO CPDR: 60.0000 mg | DELAYED_RELEASE_CAPSULE | Freq: Every day | ORAL | Status: AC

## 2010-10-09 NOTE — Progress Notes (Signed)
HISTORY OF PRESENT ILLNESS:  Laura [name] is a 35 y.o. female with the below listed medical history who presents today for followup. Laura [name] is seen in this office for GERD and interval bowel syndrome. Previous workup including colonoscopy and upper endoscopy as well as labs and imaging. Laura [name] was recently seen by our extender for problems with abdominal cramping discomfort, loose stools, bloating and worsening GERD. See that dictation. Stool studies were negative Laura [name] at CBC, sedimentation rate, and C-reactive protein were unremarkable. Laura [name] was prescribed Robinul and probiotic. Laura [name] had continued on omeprazole for GERD. Laura [name] presents now for followup. Laura [name] abdominal discomfort has resolved. Diarrhea improved. Chief complaint is postprandial fullness and belching. Describes itching on Laura [name] chest without rash. Concerned it may be medication reaction. Significant stress at work.  REVIEW OF SYSTEMS:  All non-GI ROS negative except for itching on the chest.  Past Medical History  Diagnosis Date  . IBS (irritable bowel syndrome)   . Depression   . GERD (gastroesophageal reflux disease)   . Restless legs   . Insomnia   . Headache   . UTI (lower urinary tract infection)     sees Dr Vernie Ammons  . Anemia   . Anxiety     Past Surgical History  Procedure Date  . Tonsillectomy   . Abdominal hysterectomy Nov 2008  . Neck surgery JULY 1996    L side lymph node removal  . Wisdom tooth extraction   . Tubal ligation     Social History [facility]  reports that Laura [name] has never smoked. Laura [name] has never used smokeless tobacco. Laura [name] reports that Laura [name] does not drink alcohol or use illicit drugs.  family history includes Alcohol abuse in Laura [name] father; Aneurysm in Laura [name] paternal grandfather; Coronary artery disease in Laura [name] maternal grandmother; Diabetes in Laura [name] maternal grandfather; Hypertension in Laura [name] mother; and Thyroid disease in Laura [name] mother.  There is no history of Colon cancer.  Allergies  Allergen Reactions  .  Hydromorphone Hcl   . Iohexol Other (See Comments)    Upper respiratory (sneezing)  . Penicillins   . Levaquin Rash       PHYSICAL EXAMINATION:  Vital signs: BP 120/64  Pulse 76  Ht 5\' 8"  (1.727 m)  Wt 154 lb (69.854 kg)  BMI 23.42 kg/m2 General: Well-developed, well-nourished, no acute distress HEENT: Sclerae are anicteric, conjunctiva pink. Oral mucosa intact Lungs: Clear Heart: Regular Abdomen: soft, nontender, nondistended, no obvious ascites, no peritoneal signs, normal bowel sounds. No organomegaly. Extremities: No edema Psychiatric: alert and oriented x3. Cooperative   ASSESSMENT:  #1. Irritable bowel syndrome. Recent exacerbation likely due to viral gastroenteritis. Improved. #2. GERD. Recent exacerbation due to the same. #3. Dyspepsia. Related to #1 and 2 above as well as contributions from anxiety   PLAN: #1. Empirically change PPI from omeprazole to Dexilant to address the patient's concern about possible medication reaction causing itching on the chest wall #2. Reassured regarding current symptom complex, which is improving #3. Return care of your primary provider. GI followup when necessary

## 2010-10-09 NOTE — Patient Instructions (Addendum)
Please discontinue omeprazole and take Dexilant 60 mg, 1 tablet daily in place of it. We have given you samples of this to take.

## 2010-10-10 ENCOUNTER — Encounter: Payer: Self-pay | Admitting: Internal Medicine

## 2010-10-23 NOTE — Op Note (Signed)
[name], [name]:  [name]   MEDICAL RECORD NO.:  [id number]          PATIENT TYPE:  INP   LOCATION:  9307                          FACILITY:  [facility]   PHYSICIAN:  Miguel Aschoff, M.D.       DATE OF BIRTH:  1976-01-30   DATE OF PROCEDURE:  04/16/2007  DATE OF DISCHARGE:                               OPERATIVE REPORT   PREOPERATIVE DIAGNOSIS:  Chronic pelvic pain.   POSTOPERATIVE DIAGNOSIS:  Chronic pelvic pain.   PROCEDURE:  Laparoscopically-assisted vaginal hysterectomy.   SURGEON:  Miguel Aschoff, M.D.   ASSISTANT:  Dr. Carrington Clamp.   ANESTHESIA:  General.   COMPLICATIONS:  None.   JUSTIFICATION:  The patient is a 35 year old white female with a long  history of chronic pelvic pain, previous history of endometriosis  treated laparoscopically with laser ablation of the endometriosis and  laser uterosacral nerve ablation.  Her pain has now returned.  The pain  is central, appears to be involving only the uterus.  She presents now  to undergo definitive therapy via hysterectomy.  Laparoscopy needs to be  performed to rule out any other intra-abdominal pathology.  Risks and  benefits of the procedure were discussed with the patient.   DESCRIPTION OF PROCEDURE:  The patient was taken to the operating room  and placed in the supine position.  General anesthesia was administered  without difficulty.  She was then placed in the dorsal lithotomy  position, prepped and draped in the usual sterile fashion.  Foley  catheter was inserted.  Hulka tenaculum was placed through the cervix  and then attention was directed to the umbilicus where a small  infraumbilical incision was made.  A Veress needle was inserted.  Then  the abdomen was insufflated with 3 liters of CO2.  Following the  insufflation, the trocar to laparoscope was placed followed by the  laparoscope itself.  Then under direct visualization two 5 mm ports were  established in the right and left  lower quadrants.  Then using the gyrus  unit, the utero-ovarian ligament and round ligaments were identified,  cauterized and cut.  Dissection continued bilaterally until the level of  the uterine vessels was reached.  There was no other intra-abdominal  pathology noted.  There was no residual endometriosis.  The ovaries  appeared to be completely within normal limits as were the intestinal  surfaces.  Liver was unremarkable.  Once this portion of procedure was  completed, attention was directed to the perineum.  The patient was  placed in high lithotomy position.  Weighted speculum was placed in the  vaginal vault.  The anterior cervical lip was grasped with a tenaculum  and then the cervix was injected with 10 mL of 1% Xylocaine with  epinephrine.  The cervical mucosa was then circumscribed and dissected  anteriorly and posteriorly until the peritoneal reflections were found.  Then using curved Heaney clamps, uterosacral ligaments were identified,  clamped, cut and suture ligated using suture ligatures of 0 Vicryl.  The  cardinal ligaments were clamped, cut and suture ligated in similar  fashion.  Then the anterior peritoneum was entered.  Then using curved  Heaney clamps, additional bites were taken along the paracervical  tissue.  These pedicles were cut and suture ligated using suture  ligatures of 0 Vicryl.  The uterine vessels were found, cut and suture  ligated in a similar fashion.  The residual structures of the broad  ligament were clamped with Heaney clamps, pedicles cut.  This freed the  specimen and then these pedicles were ligated using ligatures of 0  Vicryl and suture ligatures of 0 Vicryl.  The posterior cuff was then  run using running interlocking 0 Vicryl suture.  The peritoneum was  found and a pursestring suture was used to close the peritoneum.  At  this point the vaginal mucosa was reapproximated using running  interlocking 0 Vicryl sutures.  At this point an  iodoform pack was  placed in the vagina and this portion of the procedure was completed.  Attention was directed back to the umbilicus.  The abdomen was  reinsufflated and then inspection was made with the laparoscope to  ensure that hemostasis was adequate.  Inspection revealed that there was  excellent hemostasis.  At this point the CO2 was allowed to escape.  All  instruments were removed and the small incisions were closed using  subcuticular 4-0 Vicryl.  The estimated blood loss from the procedure  was approximately 150 mL.  The patient tolerated the procedure well and  went to the recovery in satisfactory condition.      Miguel Aschoff, M.D.  Electronically Signed     AR/MEDQ  D:  04/16/2007  T:  04/16/2007  Job:  [id number]

## 2010-10-26 NOTE — Op Note (Signed)
NAME:  Laura [name], Laura [name]                          ACCOUNT NO.:  [id number]   MEDICAL RECORD NO.:  [id number]                   PATIENT TYPE:  AMB   LOCATION:  DSC                                  FACILITY:  [facility]   PHYSICIAN:  Jefry H. Pollyann Kennedy, M.D.                DATE OF BIRTH:  Jul 07, 1975   DATE OF PROCEDURE:  01/02/2004  DATE OF DISCHARGE:                                 OPERATIVE REPORT   PREOPERATIVE DIAGNOSIS:  Chronic tonsillitis.   POSTOPERATIVE DIAGNOSIS:  Chronic tonsillitis.   PROCEDURE:  Tonsillectomy.   SURGEON:  Jefry H. Pollyann Kennedy, M.D.   ANESTHESIA:  General endotracheal anesthesia was used.   COMPLICATIONS:  None.   BLOOD LOSS:  None.   FINDINGS:  Mild enlargement of the tonsils bilaterally, each size with a  very large cryptic space with small amounts of debris present deep within  the space.  No appreciable adenoid tissue present.   HISTORY:  This is a 35 year old lady with a history of chronic tonsillar  pharyngitis and chronic cryptic tonsillitis with tonsillolithiasis.  Risks,  benefits, alternatives, and complications of the procedure were explained to  the patient who seemed to understand and agreed with surgery.   PROCEDURE:  The patient was taken to the operating room and placed on the  operating table in the supine position.  Following induction of general  endotracheal anesthesia, table was turned 90 degrees.  The patient was  draped in the standard fashion.  A Crowe-Davis mouth gag was inserted into  the oral cavity and used to retract the tongue and mandible and attached to  the Mayo stand.  Inspection of the palate revealed no evidence of a  __________ cleft or soft palate.  Red rubber catheter was inserted into the  right side of the nose, withdrawn through the mouth, and used to retract the  soft palate and uvula.  Indirect exam of the nasopharynx was performed and  no additional action was taken in the nasopharynx.  The tonsillectomy was  performed  using electrocautery dissection carefully dissecting the vascular  plane between the capsule and constrictor muscles.  The tonsils were sent  together for pathologic evaluation.  Spot cautery was used as needed.  The  pharynx was suctioned of its secretions and an oral gastric tube was used to  aspirate the contents of the stomach.  The patient was then awakened,  extubated, and transferred to recovery in stable condition.                                               Jefry H. Pollyann Kennedy, M.D.    JHR/MEDQ  D:  01/02/2004  T:  01/02/2004  Job:  [id number]

## 2010-10-26 NOTE — Discharge Summary (Signed)
Laura [name], FELLOWS NO.:  [id number]   MEDICAL RECORD NO.:  [id number]          PATIENT TYPE:  INP   LOCATION:  9307                          [facility]ACILITY[facility]  [facility]   PHYSICIAN:  Miguel Aschoff, M.D.       DATE OF BIRTH:  1975-09-25   DATE OF ADMISSION:  04/16/2007  DATE OF DISCHARGE:  04/18/2007                               DISCHARGE SUMMARY   ADMISSION DIAGNOSIS:  Chronic pelvic pain, dysmenorrhea.   FINAL DIAGNOSIS:  Same.   OPERATIONS AND PROCEDURES:  Laparoscopically-assisted vaginal  hysterectomy, general anesthesia.   BRIEF HISTORY:  The patient is a 35 year old white female with long  history of pelvic pain, treated previously with laparoscopy and laser  ablation of the sacral nerves.  The patient has now a recurrence of her  symptoms.  The pain has proved to be central and on clinical examination  appears to arise from the uterus.  The patient was given options of  therapy, including Lupron therapy versus definitive therapy with  hysterectomy, and she has elected to undergo definitive therapy at this  time.  The patient was admitted to the hospital to undergo  laparoscopically-assisted vaginal hysterectomy, with the laparoscopy  being performed to look for other sources of pain.   LABORATORY STUDIES:  On admission, showed a hemoglobin of 13.4,  hematocrit 38.9.  White count was 3,800.  PT and PTT were within normal  limits.  Chemistry profile was negative.  On April 16, 2007 under  general anesthesia, laparoscopically-assisted total vaginal hysterectomy  was carried out without difficulty.  The intra-abdominal findings did  not reveal any other source of her pain.  There were no implants of  endometriosis noted at this time.  The procedure was carried out without  difficulty.  The patient an had uneventful postoperative course,  tolerating increasing ambulation and diet well; however, she was not  able to be discharged home until the second postoperative  day, because  of pain management.  Her hemoglobin on discharge was 11.1, white count  4,900.  The pathology report on the hysterectomy specimen showed slight  cervicitis, desquamated necrotic endometrium.  The endometrium showed  fibrin necrosis with histiocytes.  The myometrium was unremarkable.  The  specimen weighed 46 grams.  The patient was sent home in satisfactory  condition.   MEDICATIONS FOR HOME:  Included Tylox one every 3 to 4 hours, as needed  for pain.  She instructed to return to the office in 4 weeks for follow-  up examination, to call if there are any problems such as fever, pain or  heavy bleeding, place nothing in vagina.  She was sent home on a regular  diet.  She was also sent home in satisfactory condition.      Miguel Aschoff, M.D.  Electronically Signed     AR/MEDQ  D:  04/20/2007  T:  04/20/2007  Job:  [id number]

## 2010-10-26 NOTE — H&P (Signed)
[name], Laura [name]                ACCOUNT NO.:  [id number]   MEDICAL RECORD NO.:  [id number]          PATIENT TYPE:  INP   LOCATION:  0102                         FACILITY:  [facility]   PHYSICIAN:  Sean A. Everardo All, M.D. [facility] OF BIRTH:  March 29, 1976   DATE OF ADMISSION:  12/22/2005  DATE OF DISCHARGE:                                HISTORY & PHYSICAL   REASON FOR ADMISSION:  Vomiting.   HISTORY OF PRESENT ILLNESS:  A 35 year old woman with one day of nausea,  vomiting and diarrhea.  She has some associated rectal bleeding of uncertain  amount.  She also has associated slight chest pain which she feels is due to  the vomiting.   PAST MEDICAL HISTORY:  IBS for which she had upper endoscopy and colonoscopy  several months ago.   MEDICATIONS:  Philis Nettle, Zegerid, Lexapro, Yasmin-28, Nu-Lev.   SOCIAL HISTORY:  Married.  She works in a [employer] development [employer].   REVIEW OF SYSTEMS:  Denies fever, loss of consciousness, visual loss, skin  rash, dysuria, hematuria, shortness of breath, fever.  She does have slight  crampy abdominal pain and headache.   PHYSICAL EXAMINATION:  VITAL SIGNS:  Blood pressure is 122/66, heart rate  74, respiratory rate 18, temperature is 98.5.  GENERAL:  No distress.  SKIN:  Not diaphoretic.  HEENT:  No proptosis.  No periorbital swelling.  Pharynx:  Mucus membranes  are normal.  NECK:  Supple.  CHEST:  Clear to auscultation.  CARDIOVASCULAR:  No JVD.  No edema.  Regular rate and rhythm.  No murmur.  PULSES:  Pedal pulses are intact.  ABDOMEN:  Soft.  Slight diffuse tenderness.  No hepatosplenomegaly, no mass,  not distended.  BREASTS:  Not done at this time.  GYNECOLOGIC:  Not done at this time.  RECTAL:  Per the emergency room physician does not reveal any blood.  EXTREMITIES:  No deformity.  NEUROLOGICAL:  Alert but oriented.  Does not appear anxious nor depressed.  Cranial nerves appear to be intact.  She readily moves all fours.   LABORATORY  DATA:  BMET and CBC are normal.  Urine pregnancy is negative.  Urinalysis is normal.   IMPRESSION:  1.  Nausea, vomiting, diarrhea with an uncertain amount of rectal bleeding.  2.  Headache, uncertain etiology.  3.  Chest pain, probably due to the vomiting.   PLAN:  1.  Intravenous fluid.  2.  Symptomatic therapy for nausea and pain.  3.  Recheck labs tomorrow morning.           ______________________________  Cleophas Dunker. Everardo All, M.D. [facility]     SAE/MEDQ  D:  12/23/2005  T:  12/23/2005  Job:  [id number]   cc:   Jeannett Senior A. Clent Ridges, M.D. [facility]  [address] [place]  [place] [address]

## 2010-10-26 NOTE — Op Note (Signed)
NAME:  Laura [name], Laura [name]                          ACCOUNT NO.:  [id number]   MEDICAL RECORD NO.:  [id number]                   PATIENT TYPE:  AMB   LOCATION:  ENDO                                 FACILITY:  [facility]   PHYSICIAN:  James L. Malon Kindle., M.D.          DATE OF BIRTH:  1975/10/14   DATE OF PROCEDURE:  07/07/2003  DATE OF DISCHARGE:                                 OPERATIVE REPORT   PROCEDURE:  Colonoscopy.   MEDICATIONS:  Fentanyl 125 mcg, Versed 10 mg IV.   INDICATIONS FOR PROCEDURE:  A patient with rectal bleeding that comes and  goes every since she had some pelvic surgery, had some loose bowel movements  seeing blood with every bowel movement.  She had a negative ultrasound and  hepatobiliary scan.  Family history is unknown. She thinks her sister may  have had Crohns disease.   DESCRIPTION OF PROCEDURE:  The procedure had been explained to the patient  and consent obtained. With the patient in the left lateral decubitus  position, the Olympus pediatric adjustable scope was inserted. The prep was  excellent. The patient had an extremely long tortuous colon. We had the  scope in the full extent. There apparently were many loops that we were able  to work out fairly well but eventually the patient in many different  positions we were unable to advance. We even had the patient in the prone  position and we were able to advance clearly to the hepatic flexure but were  not able to advance down into the cecum with the scope into the full extent.  After trying all these different positions, it was obvious we were not going  to be able to advance completely to the cecum. The scope was withdrawn.  Again the patient had an extremely long tortuous colon, no polyps were seen,  no diverticular disease, the mucosa was completely normal throughout.  Moderate to large internal hemorrhoids were seen upon removal of the scope  in the anal canal. The scope was withdrawn, the patient  tolerated the  procedure well.   ASSESSMENT:  1. Heme positive stool, 792.1.  2. Internal hemorrhoids, 455.0.  3. Colonoscopy complete only to the hepatic flexure.   PLAN:  Will treat the patient at this time for irritable bowel syndrome and  an irritable bowel handout. Will give Robinul Forte' 2 mg b.i.d.  Will see  her back in the office.  May well get her a barium enema or small bowel  series to rule out Crohns disease.                                               James L. Malon Kindle., M.D.    Waldron Session  D:  07/07/2003  T:  07/07/2003  Job:  [id number]  cc:   Sharlet Salina, M.D.  [address]  [place]  [place] [address]  Fax: [phone]

## 2010-10-26 NOTE — Assessment & Plan Note (Signed)
[facility]                                   ON-CALL NOTE   Jolaine Artist                         MRN:          [id number]  DATE:12/22/2005                            DOB:          [birth year]    DATE OF INTERACTION:  December 22, 2005 at 11:45 a.m.  Phone number is [phone]   OBJECTIVE:  The patient is having vomiting and diarrhea, had that all night  long and then this morning had blood in the stool, has lower abdominal pain  that radiates into the rectum.  Has changed her medicines only recently,  taking Sanctura from her urologist, which I do not think would make a  difference.  She denies fever.  She went out to dinner last night.  Had the  same thing that her husband ate at [facility] but he is not bothered.   ASSESSMENT:  Presumed gastroenteritis.   PLAN:  Blood is probably from proctitis from the diarrhea, should resolve as  the diarrhea gets better.  Stay on clear liquids today, take bananas, rice,  apples and toast tomorrow.  If no improvement or blood continues or if  symptoms worsen go to the emergency room.  Tomorrow if symptoms continue go  to the office to be seen.  Primary care Jamayia Croker is Dr. Clent Ridges.  Home office  [facility].                                   Arta Silence, MD   RNS/MedQ  DD:  12/22/2005  DT:  12/22/2005  Job #:  [id number]   cc:   Tera Mater. Clent Ridges, MD

## 2010-10-26 NOTE — Op Note (Signed)
NAMECHANIN, FRUMKIN NO.:  [id number]   MEDICAL RECORD NO.:  [id number]          PATIENT TYPE:  AMB   LOCATION:  SDC                           FACILITY:  [facility]   PHYSICIAN:  Miguel Aschoff, M.D.       DATE OF BIRTH:  May 13, 1976   DATE OF PROCEDURE:  10/03/2006  DATE OF DISCHARGE:                               OPERATIVE REPORT   PREOPERATIVE DIAGNOSIS:  1. Chronic pelvic pain.  2. Menorrhagia.  3. Desired sterilization.   POSTOPERATIVE DIAGNOSES:  1. Chronic pelvic pain.  2. Menorrhagia.  3. Desired sterilization.  4. Probable adenomyosis.   PROCEDURES:  1. Diagnostic laparoscopy.  2. Bilateral tubal sterilization using cautery and division.  3. YAG laser uterosacral nerve ablation.  4. This was followed by dilatation, hysteroscopy and NovaSure      endometrial ablation.   SURGEON:  Miguel Aschoff, M.D.   ANESTHESIA:  General.   COMPLICATIONS:  None.   JUSTIFICATION:  The patient is a 35 year old white female with a history  of persistent chronic suprapubic pelvic pain unresponsive to outpatient  therapy with antibiotics and analgesics.  Outpatient evaluation has been  essentially negative for the source of this pain.  In addition, the  patient does not desire any further children and reports having heavy  menstrual cycles when she is not on birth control pills.  She is being  brought to the operating room at this time to see if an etiology for the  pain can be established via laparoscopy.  In addition, at this time,  unless indicated that a hysterectomy would be indicated, the patient is  to undergo bilateral tubal sterilization as well as endometrial ablation  in an effort to control her periods.  The risks and benefits of the  procedures have been discussed with the patient.   PROCEDURE:  The patient was taken to the operating room and placed in  supine position, and general anesthesia was administered without  difficulty.  She was then placed in the  dorsal lithotomy position,  prepped and draped in the usual sterile fashion.  The bladder was  catheterized.  Once this was done examination was carried out under  anesthesia, which revealed normal external genitalia, normal Bartholin  and Skene's glands, normal urethra.  The vaginal vault was without gross  lesion.  The cervix was without gross lesion.  The uterus appeared be  normal size and shape.  The adnexa revealed no masses.  At this point  the tenaculum was placed to the cervix and held and then attention was  directed to the umbilicus, where a small infraumbilical incision was  made.  The Veress needle was then inserted and the abdomen was  insufflated with 3 L of CO2.  Following insufflation, the trocar to the  laparoscope was placed followed by the laparoscope itself.  Systematic  inspection of the pelvic organs revealed normal anterior bladder  peritoneum.  The round ligaments were unremarkable.  There was no  evidence of any inguinal hernias.  The tubes were normal along their  course.  The fimbriae were fine and  delicate bilaterally.  The ovaries  were inspected and appeared to be totally within normal limits.  The  appendix was visualized and was noted to be within normal limits.  The  liver surface was unremarkable.  The gallbladder was visualized and  appeared to be normal.  The uterus appeared somewhat mottled in  appearance and posterior the surface of the uterus had a small plaque-  like areas on it, not clear whether this represented evident areas of  endometriosis or decidual reaction.  In the cul-de-sac, no definite  implants of endometriosis were noted; however, peritoneal windows were  noted in the cul-de-sac.  A 5 mm suprapubic port was then established  under direct visualization.  The bipolar cautery forceps were introduced  and the midportion of each tube was then cauterized for 3 cm in its  midportion.  Once the tube was cauterized, it was then divided with   laparoscopic scissors.  In an effort to control the patient's pain, it  was elected to proceed with laser uterosacral nerve ablation.  The YAG  laser fiber with an GRP-6 tip was then introduced through the operating  channel of the laparoscope and with 15 watts of power, the uterosacral  ligaments were partially transected without difficulty and with good  hemostasis.  At this point this portion of the procedure was completed.  Findings were documented photographically.  The CO2 was then allowed to  escape.  The laparoscope was removed.  The trocars were left in place.  Attention was then directed to be perineum.  A speculum was placed back  in the vaginal vault.  The Hulka tenaculum was removed and a single-  tooth tenaculum was replaced in this area.  The uterine cavity was then  sounded at 7.5 cm.  A cervical length of 3.5 cm was then found.  After  this was done, the cervix was further dilated and then the diagnostic  hysteroscope was advanced through the endocervix.  No endocervical  lesions were noted.  On visualization of endometrial cavity, there was  no sign of any submucous myomas or polyps.  At this point the  hysteroscopy was completed.  The hysteroscope was removed and the cavity  was curetted and the curettings were sent for histologic study.  At this  point the NovaSure endometrial ablation unit was introduced through the  cervix and a cavity width of 3.1 cm was found.  At this point a  treatment cycle was then carried out and completed successfully.  On  completion of the procedure, the NovaSure ablation unit was removed  intact.  There was excellent hemostasis at all instruments were removed,  the patient taken out lithotomy position.  At this point the trocars and  that remained in the abdomen were removed.  All CO2 was allowed to  escape.  The small incisions on the abdomen were then closed using subcuticular 4-0 Vicryl.  The estimated blood loss from all procedures  was  less than 30 mL.  The patient tolerated the procedure well.   The plan is for the patient be discharged home.  Medications for home  include Tylox one every 3 hours as needed for pain and doxycycline one  twice a day x3 days.  The patient is call if there are any problems such  as fever, pain or heavy bleeding.  The patient is to return to the  office in 4 weeks for follow-up examination.      Miguel Aschoff, M.D.  Electronically Signed  AR/MEDQ  D:  10/03/2006  T:  10/03/2006  Job:  [id number]

## 2010-10-29 ENCOUNTER — Encounter: Payer: Self-pay | Admitting: Family Medicine

## 2010-10-29 ENCOUNTER — Ambulatory Visit (INDEPENDENT_AMBULATORY_CARE_PROVIDER_SITE_OTHER): Payer: 59 | Admitting: Family Medicine

## 2010-10-29 VITALS — BP 112/70 | HR 105 | Temp 98.6°F | Wt 154.0 lb

## 2010-10-29 DIAGNOSIS — N760 Acute vaginitis: Secondary | ICD-10-CM

## 2010-10-29 DIAGNOSIS — R300 Dysuria: Secondary | ICD-10-CM

## 2010-10-29 DIAGNOSIS — A499 Bacterial infection, unspecified: Secondary | ICD-10-CM

## 2010-10-29 DIAGNOSIS — F329 Major depressive disorder, single episode, unspecified: Secondary | ICD-10-CM

## 2010-10-29 LAB — POCT URINALYSIS DIPSTICK
Glucose, UA: NEGATIVE
Nitrite, UA: NEGATIVE
Protein, UA: NEGATIVE
Spec Grav, UA: 1.005
Urobilinogen, UA: 0.2
pH, UA: 6

## 2010-10-29 MED ORDER — LORAZEPAM 0.5 MG PO TABS: 0.5000 mg | ORAL_TABLET | Freq: Four times a day (QID) | ORAL | Status: DC | PRN

## 2010-10-29 MED ORDER — METRONIDAZOLE 500 MG PO TABS: 500.0000 mg | ORAL_TABLET | Freq: Three times a day (TID) | ORAL | Status: AC

## 2010-10-29 MED ORDER — BUPROPION HCL ER (XL) 300 MG PO TB24: 300.0000 mg | ORAL_TABLET | ORAL | Status: DC
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
## 2010-11-06 ENCOUNTER — Telehealth: Payer: Self-pay | Admitting: Family Medicine

## 2010-11-06 NOTE — Telephone Encounter (Signed)
I agree with stopping the Metronidazole. Try benadryl. See if she feels better tomorrow. If not, let us know

## 2010-11-06 NOTE — Telephone Encounter (Signed)
[facility] Triage Call Report Triage Record Num: [id number] Operator: Ethlyn Gallery Patient Name: Mabel Roll Call Date & Time: 11/05/2010 11:21:38PM Patient Phone: [phone] PCP: Tera Mater. Clent Ridges Patient Gender: Female PCP Fax : [phone] Patient DOB: 11/24/1975 Practice Name: [facility] Reason for Call: [id number], Shelia/pt called and stated she is on an Flagyl for an vaginal infection 10/30/10. she states she is experieincing nausea and starts shaking 1 hr after taking the medication. Afebrile. All Emergent Sxs R/O Per Nausea or Vomiting Protocol except Sxs began after starting RX for Flagyl. Silvio Pate Advised she will need to be seen 11/06/10. She was advised to call ofice 11/06/10. She states she has Zofran for nausea. Homecare advice given. Protocol(s) Used: Nausea or Vomiting Recommended Outcome per Protocol: Call Provider within 24 Hours Reason for Outcome: Symptoms began after starting or changing dose of prescription, nonprescription, alternative medication, or illicit drug Care Advice: ~ Avoid drinking alcoholic or caffeinated beverages. ~ Speak with provider before next dose of medication is due. ~ SYMPTOM / CONDITION MANAGEMENT ~ CAUTIONS Nausea Care Advice: - Drink small amounts of clear, sweetened liquids or ice cold drinks. - Eat light, bland foods such as saltine crackers or plain bread. - Do not eat high fat, highly seasoned, high fiber, or high sugar content foods. - Avoid mixing hot food and cold foods. - Eat smaller, more frequent meals. - Rest as much as possible in a sitting or in a propped lying position. Do not lie flat for at least 2 hours after eating. - Do not take pain medication (such as aspirin, NSAIDs) while nauseated. - Rest as much as possible until symptoms improve since activity may worsen nausea. ~ Medication Advice: - Discontinue all nonprescription and alternative medications, especially stimulants, until evaluated by  provider. - Take prescribed medications as directed, following label instructions for the medication. - Do not change medications or dosing regimen until provider is consulted. - Know possible side effects of medication and what to do if they occur. - Tell provider all prescription, nonprescription or alternative medications that you take ~ 11/05/2010 11:44:03PM Page 1 of 1 [id number]

## 2010-11-06 NOTE — Telephone Encounter (Signed)
Pt complains she is having having an allergic reaction to Flagyl, .......nauseated, off balance, low grade fever, and flu like symptoms.  Advised not to take anymore, and let us talk to Dr. Clent Ridges.

## 2010-11-06 NOTE — Telephone Encounter (Signed)
Pt called and may be having an allergic reaction to antibiotic. Pt has been very nauseous, shaky, off balance, fever, chills and flu like symptoms. Pls advise.

## 2010-11-07 NOTE — Telephone Encounter (Signed)
Pt is not feeling a lot better, but does not want to come to the office yet.  Will call if get worse.

## 2010-11-09 ENCOUNTER — Encounter: Payer: Self-pay | Admitting: Family Medicine

## 2010-11-09 ENCOUNTER — Ambulatory Visit (INDEPENDENT_AMBULATORY_CARE_PROVIDER_SITE_OTHER): Payer: 59 | Admitting: Family Medicine

## 2010-11-09 DIAGNOSIS — G589 Mononeuropathy, unspecified: Secondary | ICD-10-CM

## 2010-11-09 DIAGNOSIS — G629 Polyneuropathy, unspecified: Secondary | ICD-10-CM

## 2010-11-09 DIAGNOSIS — R109 Unspecified abdominal pain: Secondary | ICD-10-CM

## 2010-11-09 DIAGNOSIS — R6883 Chills (without fever): Secondary | ICD-10-CM

## 2010-11-09 LAB — CBC WITH DIFFERENTIAL/PLATELET
Basophils Absolute: 0 10*3/uL (ref 0.0–0.1)
Eosinophils Relative: 0.3 % (ref 0.0–5.0)
HCT: 38.8 % (ref 36.0–46.0)
Hemoglobin: 13.1 g/dL (ref 12.0–15.0)
Lymphocytes Relative: 18.4 % (ref 12.0–46.0)
Monocytes Relative: 7.1 % (ref 3.0–12.0)
Neutro Abs: 4.1 10*3/uL (ref 1.4–7.7)
Platelets: 170 10*3/uL (ref 150.0–400.0)
RDW: 13 % (ref 11.5–14.6)
WBC: 5.5 10*3/uL (ref 4.5–10.5)

## 2010-11-09 LAB — VITAMIN B12: Vitamin B-12: 185 pg/mL — ABNORMAL LOW (ref 211–911)

## 2010-11-09 LAB — TSH: TSH: 1.26 u[IU]/mL (ref 0.35–5.50)

## 2010-11-09 LAB — BASIC METABOLIC PANEL
BUN: 10 mg/dL (ref 6–23)
Calcium: 9.3 mg/dL (ref 8.4–10.5)
Creatinine, Ser: 0.8 mg/dL (ref 0.4–1.2)
GFR: 82.92 mL/min (ref >60.00)
Glucose, Bld: 82 mg/dL (ref 70–99)
Potassium: 4.4 mEq/L (ref 3.5–5.1)

## 2010-11-09 LAB — HEPATIC FUNCTION PANEL
AST: 10 U/L (ref 0–37)
Albumin: 3.9 g/dL (ref 3.5–5.2)
Total Bilirubin: 0.6 mg/dL (ref 0.3–1.2)

## 2010-11-09 NOTE — Progress Notes (Signed)
  Subjective:    Patient ID: Laura [name], female    DOB: Aug 22, 1975, 35 y.o.   MRN: [id number]  HPI Here with a host of symptoms that started over a month ago but which have escalated in the past week. She started with a warm sensation with tingling or itching or burning all over her body. Then she developed abdominal bloating and a mild central abdominal aching that comes and goes. She is nauseated but has not vomited. No fever. No URI symptoms. No visible rashes. Stools are a bit looser than normal. She was taking some Flagyl for vaginitis symptoms but she stopped this 5 days ago. Appetite is very depressed. She started on Wellbutrin about 2 weeks ago, but her current symptoms began before she started this. Also she has taken this in the past and tolerated it quite well.    Review of Systems  Constitutional: Positive for chills, appetite change, fatigue and unexpected weight change.  HENT: Negative.   Eyes: Negative.   Respiratory: Negative.   Cardiovascular: Negative.   Gastrointestinal: Positive for nausea, abdominal pain and abdominal distention. Negative for vomiting, diarrhea, constipation, blood in stool, anal bleeding and rectal pain.  Genitourinary: Negative.   Skin: Negative.   Neurological: Negative.        Objective:   Physical Exam  Constitutional: She is oriented to person, place, and time. She appears well-developed and well-nourished.  Neck: No thyromegaly present.  Cardiovascular: Normal rate, regular rhythm, normal heart sounds and intact distal pulses.   Pulmonary/Chest: Effort normal and breath sounds normal.  Abdominal: Soft. Bowel sounds are normal. She exhibits no distension and no mass. There is no rebound and no guarding.       Mildly tender around the umbilicus   Lymphadenopathy:    She has no cervical adenopathy.  Neurological: She is alert and oriented to person, place, and time.  Skin: Skin is warm and dry. No rash noted. No erythema. No pallor.         Assessment & Plan:  Multiple symptoms of unclear etiology. We will get some labs today to try to figure this out. Rest and drink fluids this weekend.

## 2010-11-12 ENCOUNTER — Telehealth: Payer: Self-pay | Admitting: Family Medicine

## 2010-11-12 LAB — TISSUE TRANSGLUTAMINASE, IGA: Tissue Transglutaminase Ab, IgA: 3.9 U/mL (ref <20)

## 2010-11-12 NOTE — Telephone Encounter (Signed)
Pt needs blood work results °

## 2010-11-13 ENCOUNTER — Other Ambulatory Visit: Payer: Self-pay | Admitting: *Deleted

## 2010-11-13 ENCOUNTER — Ambulatory Visit (INDEPENDENT_AMBULATORY_CARE_PROVIDER_SITE_OTHER): Payer: 59 | Admitting: Family Medicine

## 2010-11-13 DIAGNOSIS — D519 Vitamin B12 deficiency anemia, unspecified: Secondary | ICD-10-CM

## 2010-11-13 DIAGNOSIS — D518 Other vitamin B12 deficiency anemias: Secondary | ICD-10-CM

## 2010-11-13 MED ORDER — ERGOCALCIFEROL 1.25 MG (50000 UT) PO CAPS: 50000.0000 [IU] | ORAL_CAPSULE | ORAL | Status: AC

## 2010-11-13 MED ORDER — CYANOCOBALAMIN 1000 MCG/ML IJ SOLN
1000.0000 ug | Freq: Once | INTRAMUSCULAR | Status: AC
  Administered 2010-11-13: 1000 ug via INTRAMUSCULAR

## 2010-11-13 NOTE — Progress Notes (Signed)
Call placed to patient at [phone], no answer. A detailed voice message was left informing patient per Dr Claris Che instructions. She was advised to call back if any questions.

## 2010-11-14 NOTE — Telephone Encounter (Signed)
I answered this on 11-11-10. See my note

## 2010-11-14 NOTE — Telephone Encounter (Signed)
Pt states that she has spoken to someone already and is still awaiting the celiac results as we are also

## 2010-11-15 ENCOUNTER — Telehealth: Payer: Self-pay | Admitting: Family Medicine

## 2010-11-15 NOTE — Telephone Encounter (Signed)
Please find out why all her labs are not back yet

## 2010-11-16 ENCOUNTER — Telehealth: Payer: Self-pay | Admitting: Family Medicine

## 2010-11-16 NOTE — Telephone Encounter (Signed)
Waiting on results

## 2010-11-16 NOTE — Telephone Encounter (Signed)
Paper fax on the way

## 2010-11-19 ENCOUNTER — Ambulatory Visit (INDEPENDENT_AMBULATORY_CARE_PROVIDER_SITE_OTHER): Payer: 59 | Admitting: Internal Medicine

## 2010-11-19 DIAGNOSIS — E538 Deficiency of other specified B group vitamins: Secondary | ICD-10-CM

## 2010-11-19 MED ORDER — CYANOCOBALAMIN 1000 MCG/ML IJ SOLN
1000.0000 ug | Freq: Once | INTRAMUSCULAR | Status: AC
  Administered 2010-11-19: 1000 ug via INTRAMUSCULAR

## 2010-11-19 MED ORDER — NYSTATIN 100000 UNIT/ML MT SUSP: 500000.0000 [IU] | Freq: Four times a day (QID) | OROMUCOSAL | Status: AC

## 2010-11-27 ENCOUNTER — Ambulatory Visit (INDEPENDENT_AMBULATORY_CARE_PROVIDER_SITE_OTHER): Payer: 59 | Admitting: Family Medicine

## 2010-11-27 DIAGNOSIS — E538 Deficiency of other specified B group vitamins: Secondary | ICD-10-CM

## 2010-11-27 MED ORDER — CYANOCOBALAMIN 1000 MCG/ML IJ SOLN
1000.0000 ug | Freq: Once | INTRAMUSCULAR | Status: AC
  Administered 2010-11-27: 1000 ug via INTRAMUSCULAR

## 2010-11-29 ENCOUNTER — Encounter: Payer: Self-pay | Admitting: Family Medicine

## 2010-12-04 ENCOUNTER — Ambulatory Visit (INDEPENDENT_AMBULATORY_CARE_PROVIDER_SITE_OTHER): Payer: 59 | Admitting: Family Medicine

## 2010-12-04 ENCOUNTER — Telehealth: Payer: Self-pay | Admitting: *Deleted

## 2010-12-04 DIAGNOSIS — E538 Deficiency of other specified B group vitamins: Secondary | ICD-10-CM

## 2010-12-04 MED ORDER — CYANOCOBALAMIN 1000 MCG/ML IJ SOLN
1000.0000 ug | Freq: Once | INTRAMUSCULAR | Status: AC
  Administered 2010-12-04: 1000 ug via INTRAMUSCULAR

## 2010-12-04 NOTE — Telephone Encounter (Signed)
Pt is having night sweats and wanted to know if this is due to the vit d & vit b levels are too low?

## 2010-12-04 NOTE — Telephone Encounter (Signed)
Yes it could be. Hopefull these will improve as we get her levels up

## 2010-12-05 NOTE — Telephone Encounter (Signed)
left message on machine for pt to return our call

## 2010-12-05 NOTE — Telephone Encounter (Signed)
Pt aware.

## 2010-12-11 ENCOUNTER — Ambulatory Visit (INDEPENDENT_AMBULATORY_CARE_PROVIDER_SITE_OTHER): Payer: 59 | Admitting: Family Medicine

## 2010-12-11 DIAGNOSIS — E538 Deficiency of other specified B group vitamins: Secondary | ICD-10-CM

## 2010-12-11 MED ORDER — CYANOCOBALAMIN 1000 MCG/ML IJ SOLN
1000.0000 ug | Freq: Once | INTRAMUSCULAR | Status: AC
  Administered 2010-12-11: 1000 ug via INTRAMUSCULAR

## 2010-12-18 ENCOUNTER — Ambulatory Visit (INDEPENDENT_AMBULATORY_CARE_PROVIDER_SITE_OTHER): Payer: 59 | Admitting: Internal Medicine

## 2010-12-18 DIAGNOSIS — E538 Deficiency of other specified B group vitamins: Secondary | ICD-10-CM

## 2010-12-18 MED ORDER — CYANOCOBALAMIN 1000 MCG/ML IJ SOLN
1000.0000 ug | Freq: Once | INTRAMUSCULAR | Status: AC
  Administered 2010-12-18: 1000 ug via INTRAMUSCULAR

## 2010-12-25 ENCOUNTER — Ambulatory Visit (INDEPENDENT_AMBULATORY_CARE_PROVIDER_SITE_OTHER): Payer: 59 | Admitting: Family Medicine

## 2010-12-25 DIAGNOSIS — E538 Deficiency of other specified B group vitamins: Secondary | ICD-10-CM

## 2010-12-25 MED ORDER — CYANOCOBALAMIN 1000 MCG/ML IJ SOLN
1000.0000 ug | Freq: Once | INTRAMUSCULAR | Status: AC
  Administered 2010-12-25: 1000 ug via INTRAMUSCULAR

## 2010-12-31 ENCOUNTER — Ambulatory Visit (INDEPENDENT_AMBULATORY_CARE_PROVIDER_SITE_OTHER): Payer: 59 | Admitting: Family Medicine

## 2010-12-31 DIAGNOSIS — E539 Vitamin B deficiency, unspecified: Secondary | ICD-10-CM

## 2010-12-31 MED ORDER — CYANOCOBALAMIN 1000 MCG/ML IJ SOLN
1000.0000 ug | Freq: Once | INTRAMUSCULAR | Status: AC
  Administered 2010-12-31: 1000 ug via INTRAMUSCULAR

## 2011-01-08 ENCOUNTER — Ambulatory Visit (INDEPENDENT_AMBULATORY_CARE_PROVIDER_SITE_OTHER): Payer: 59 | Admitting: Family Medicine

## 2011-01-08 DIAGNOSIS — E539 Vitamin B deficiency, unspecified: Secondary | ICD-10-CM

## 2011-01-08 MED ORDER — CYANOCOBALAMIN 1000 MCG/ML IJ SOLN
1000.0000 ug | Freq: Once | INTRAMUSCULAR | Status: AC
  Administered 2011-01-08: 1000 ug via INTRAMUSCULAR

## 2011-01-16 ENCOUNTER — Ambulatory Visit (INDEPENDENT_AMBULATORY_CARE_PROVIDER_SITE_OTHER): Payer: 59 | Admitting: Family Medicine

## 2011-01-16 DIAGNOSIS — E539 Vitamin B deficiency, unspecified: Secondary | ICD-10-CM

## 2011-01-16 MED ORDER — CYANOCOBALAMIN 1000 MCG/ML IJ SOLN
1000.0000 ug | Freq: Once | INTRAMUSCULAR | Status: AC
  Administered 2011-01-16: 1000 ug via INTRAMUSCULAR

## 2011-01-17 ENCOUNTER — Telehealth: Payer: Self-pay | Admitting: Internal Medicine

## 2011-01-17 NOTE — Telephone Encounter (Signed)
Pt is being seen 01/23/11. She gets weekly B12 injections and wants to know if she can get the injection here with her visit instead of having to go to [facility] at [place]. Dr. Marina Goodell please advise.

## 2011-01-22 NOTE — Telephone Encounter (Signed)
Pt aware.

## 2011-01-22 NOTE — Telephone Encounter (Signed)
I prefer that her PCP supervise and monitor her B12 replacement, as I do not see her regularly.  Her PCP might be able to have her get her injections through primary care at [facility] ?

## 2011-01-23 ENCOUNTER — Ambulatory Visit (INDEPENDENT_AMBULATORY_CARE_PROVIDER_SITE_OTHER): Payer: 59 | Admitting: Internal Medicine

## 2011-01-23 ENCOUNTER — Encounter: Payer: Self-pay | Admitting: Internal Medicine

## 2011-01-23 VITALS — BP 118/70 | HR 76 | Ht 68.0 in | Wt 146.0 lb

## 2011-01-23 DIAGNOSIS — F419 Anxiety disorder, unspecified: Secondary | ICD-10-CM

## 2011-01-23 DIAGNOSIS — R634 Abnormal weight loss: Secondary | ICD-10-CM

## 2011-01-23 DIAGNOSIS — E538 Deficiency of other specified B group vitamins: Secondary | ICD-10-CM

## 2011-01-23 DIAGNOSIS — R198 Other specified symptoms and signs involving the digestive system and abdomen: Secondary | ICD-10-CM

## 2011-01-23 DIAGNOSIS — F411 Generalized anxiety disorder: Secondary | ICD-10-CM

## 2011-01-23 DIAGNOSIS — R1084 Generalized abdominal pain: Secondary | ICD-10-CM

## 2011-01-23 MED ORDER — PEG-KCL-NACL-NASULF-NA ASC-C 100 G PO SOLR: 1.0000 | Freq: Once | ORAL | Status: DC

## 2011-01-23 NOTE — Patient Instructions (Addendum)
You have been scheduled for a CT scan of the abdomen and pelvis at [facility] ([address]---this is in the same building as [facility]).   You are scheduled on  01/29/11 at 8:30 a,. You should arrive 15 minutes prior to your appointment time for registration. Please follow the written instructions below on the day of your exam:  1) Do not eat or drink anything after 4:30 am (4 hours prior to your test) 2) You have been given 2 bottles of oral contrast to drink. The solution may taste  better if refrigerated, but do NOT add ice or any other liquid to this solution. Shake well before drinking.  Drink 1 bottle of contrast @6 :30 am (2 hours prior to your exam)  Drink 1 bottle of contrast @ 7:30 am (1 hour prior to your exam)  You may take any medications as prescribed with a small amount of water except for the following: Metformin, Glucophage, Glucovance, Avandamet, Riomet, Fortamet, Actoplus Met, Janumet, Glumetza or Metaglip. The above medications must be held the day of the exam and 48 hours after the exam.  The purpose of you drinking the oral contrast is to aid in the visualization of your intestinal tract. The contrast solution may cause some diarrhea. Before your exam is started, you will be given a small amount of fluid to drink. Depending on your individual set of symptoms, you may also receive an intravenous injection of x-ray contrast/dye.  Endo/Colon scheduled for 01/25/11 8:00 am arrive at 7:30 am on 4th floor. Moviprep has been sent to your pharmacy, Colon/Endo brochures given for you to read. B12 injection given today.    If you have any questions regarding your exam or if you need to reschedule, you may call the CT department at [phone] between the hours of 8:00 am and 5:00 pm, Monday-Friday.  ________________________________________________________________________

## 2011-01-23 NOTE — Progress Notes (Signed)
HISTORY OF PRESENT ILLNESS:  Laura [name] is a 35 y.o. female anxiety/depression, chronic headaches, GERD, and IBS. Has had chronic GI complaints of varying degrees for 15-20 years. Has undergone GI specialty evaluation elsewhere and here. Last seen 10-09-10 for follow up of GI complaints.she was improving with a primary diagnosis of GERD, IBS possibly exacerbated by an infectious gastroenteritis, and functional issues. Tells me that she was seen by Dr Clent Ridges for c/o nausea, fatigue, aches, and "burning skin". CBC, CMET were normal. Vit D and B12 a little low (being treated). Celiac profile negative. Now c/o postprandial abdominal pain and 8# weight loss past 4 months. Decreased appetite secondary to pain. Tends to have a BM every 3-5 days (occassionally longer) followed by loose stools. The pain is in the lower abdomen. She reports a history or endometriosis for which she underwent hysterectomy in 2008 (Dr Freda Jackson is her Gyn). Occasionally wakes with pain. No bleeding or vomiting. Really not using anti-spasmotics or anxiolytics stating drowsiness. Prior GI workups are well documented and have included multiple imaging studies, labs, stool studies, serologies, and colonoscopy and upper endoscopy with biopsies (last 2007). Has had problems with transient weight loss in past, seemingly related to anxiety. 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  REVIEW OF SYSTEMS:  All non-GI ROS negative except for anxiety, fatigue, itching, urinary complaints, aches  Past Medical History  Diagnosis Date  . IBS (irritable bowel syndrome)   . Depression   . GERD (gastroesophageal reflux disease)   . Restless legs   . Insomnia   . Headache   . UTI (lower urinary tract infection)     sees Dr Vernie Ammons  . Anemia   . Anxiety     Past Surgical History  Procedure Date  . Tonsillectomy   . Abdominal hysterectomy Nov 2008  . Neck surgery JULY 1996      L side lymph node removal  . Wisdom tooth extraction   . Tubal ligation     Social History Laura [name]  reports that she has never smoked. She has never used smokeless tobacco. She reports that she drinks alcohol. She reports that she does not use illicit drugs.  family history includes Alcohol abuse in her father; Aneurysm in her paternal grandfather; Coronary artery disease in her maternal grandmother; Diabetes in her maternal grandfather; Hypertension in her mother; and Thyroid disease in her mother.  There is no history of Colon cancer.  Allergies  Allergen Reactions  . Hydromorphone Hcl   . Iohexol Other (See Comments)    Upper respiratory (sneezing)  . Penicillins   . Levaquin Rash       PHYSICAL EXAMINATION: Vital signs: BP 118/70  Pulse 76  Ht 5\' 8"  (1.727 m)  Wt 146 lb (66.225 kg)  BMI 22.20 kg/m2 General: Well-developed, well-nourished, no acute distress HEENT: Sclerae are anicteric, conjunctiva pink. Oral mucosa intact Lungs: Clear Heart: Regular Abdomen: soft, nontender, nondistended, no obvious ascites, no peritoneal signs, normal bowel sounds. No organomegaly. Extremities: No edema Psychiatric: alert and oriented x3. Cooperative    ASSESSMENT:  #1. Post prandial lower abdominal pain without obvious cause. Suspect functional. #2. Anxiety with increased social stressors #3. Weight loss due to #1 and #2 above, in the face of decrease appetite / po intake  #4. History of endometriosis - ? Any relationship to current complaints #5. Decreased B12 and Vitamin D levels. ? Underlying GI  disorder. Nothing to suggest malabsorption. She requests to get her B12 injection here today   PLAN:  1. Contrast CT abdomen and pelvis 2. B12 1000 mcg IM today. Future replacement with PCP 3. Colonoscopy and upper endoscopy with biopsies. The nature of the procedure, as well as the risks, benefits, and alternatives were carefully and thoroughly reviewed with the patient.  Ample time for discussion and questions allowed. The patient understood, was satisfied, and agreed to proceed. Movi prep prescribed. Propofol sedation due to anxiety / anxiolytic recommended. 4. GYN evaluation just to be certain that there is no primary GYN process contributing to symptoms. 5. If work ups negative, patient may need to have psych issues addressed further with Dr Clent Ridges

## 2011-01-24 ENCOUNTER — Encounter: Payer: Self-pay | Admitting: Internal Medicine

## 2011-01-24 MED ORDER — CYANOCOBALAMIN 1000 MCG/ML IJ SOLN
1000.0000 ug | Freq: Once | INTRAMUSCULAR | Status: AC
  Administered 2011-01-23: 1000 ug via INTRAMUSCULAR

## 2011-01-25 ENCOUNTER — Encounter: Payer: Self-pay | Admitting: Internal Medicine

## 2011-01-25 ENCOUNTER — Ambulatory Visit (AMBULATORY_SURGERY_CENTER): Payer: 59 | Admitting: Internal Medicine

## 2011-01-25 VITALS — BP 109/62 | HR 78 | Temp 97.1°F | Resp 18 | Ht 68.0 in | Wt 146.0 lb

## 2011-01-25 DIAGNOSIS — R1084 Generalized abdominal pain: Secondary | ICD-10-CM

## 2011-01-25 DIAGNOSIS — E538 Deficiency of other specified B group vitamins: Secondary | ICD-10-CM

## 2011-01-25 DIAGNOSIS — R198 Other specified symptoms and signs involving the digestive system and abdomen: Secondary | ICD-10-CM

## 2011-01-25 DIAGNOSIS — R634 Abnormal weight loss: Secondary | ICD-10-CM

## 2011-01-25 DIAGNOSIS — R109 Unspecified abdominal pain: Secondary | ICD-10-CM

## 2011-01-25 MED ORDER — SODIUM CHLORIDE 0.9 % IV SOLN: 500.0000 mL | INTRAVENOUS | Status: DC

## 2011-01-25 NOTE — Patient Instructions (Signed)
Please read the handouts given to you by your recovery room nurse.   Your biopsy results will be mailed to you within 2 weeks.   I do recommend keeping a food diary for a month.  This will help [place] to determine what might be causing your symptoms.   Resume your routine medicines today.  Call us at [phone] if you have any questions.  Make an appt. To see Dr. Marina Goodell after your CT scan.  Thank-you.

## 2011-01-28 ENCOUNTER — Telehealth: Payer: Self-pay

## 2011-01-28 MED ORDER — DIPHENHYDRAMINE HCL 50 MG PO CAPS: ORAL_CAPSULE | ORAL | Status: DC

## 2011-01-28 MED ORDER — PREDNISONE 50 MG PO TABS: ORAL_TABLET | ORAL | Status: DC

## 2011-01-28 NOTE — Telephone Encounter (Signed)

## 2011-01-28 NOTE — Telephone Encounter (Signed)
Pt is scheduled for CT scan of abdomen and pelvis tomorrow. Pt is allergic to Iohexol. Premedication regimen faxed over by [facility]. Per Dr. Marina Goodell ok to call in pre-meds for pt. Pt aware on how to take medication.

## 2011-01-29 ENCOUNTER — Ambulatory Visit (INDEPENDENT_AMBULATORY_CARE_PROVIDER_SITE_OTHER)
Admission: RE | Admit: 2011-01-29 | Discharge: 2011-01-29 | Disposition: A | Discharge status: Home / Self Care | Payer: 59 | Source: Ambulatory Visit | Attending: Internal Medicine | Admitting: Internal Medicine

## 2011-01-29 ENCOUNTER — Telehealth: Payer: Self-pay

## 2011-01-29 DIAGNOSIS — R198 Other specified symptoms and signs involving the digestive system and abdomen: Secondary | ICD-10-CM

## 2011-01-29 DIAGNOSIS — R1084 Generalized abdominal pain: Secondary | ICD-10-CM

## 2011-01-29 DIAGNOSIS — R634 Abnormal weight loss: Secondary | ICD-10-CM

## 2011-01-29 MED ORDER — IOHEXOL 300 MG/ML  SOLN
80.0000 mL | Freq: Once | INTRAMUSCULAR | Status: AC | PRN
  Administered 2011-01-29: 80 mL via INTRAVENOUS

## 2011-01-29 NOTE — Telephone Encounter (Signed)
Pt aware.

## 2011-01-29 NOTE — Telephone Encounter (Signed)
Message copied by Michele Mcalpine on Tue Jan 29, 2011  1:41 PM ------      Message from: Hilarie Fredrickson      Created: Tue Jan 29, 2011  1:15 PM       Let pt know CT is normal

## 2011-02-01 ENCOUNTER — Ambulatory Visit (INDEPENDENT_AMBULATORY_CARE_PROVIDER_SITE_OTHER): Payer: 59 | Admitting: Family Medicine

## 2011-02-01 ENCOUNTER — Telehealth: Payer: Self-pay | Admitting: Family Medicine

## 2011-02-01 DIAGNOSIS — E539 Vitamin B deficiency, unspecified: Secondary | ICD-10-CM

## 2011-02-01 MED ORDER — CYANOCOBALAMIN 1000 MCG/ML IJ SOLN
1000.0000 ug | Freq: Once | INTRAMUSCULAR | Status: AC
  Administered 2011-02-01: 1000 ug via INTRAMUSCULAR

## 2011-02-01 NOTE — Telephone Encounter (Signed)
We got her levels on 11-09-10 and we were going to recheck in 12 weeks, so it is time. Set her up for B12 level and vitamin D level next week.

## 2011-02-01 NOTE — Telephone Encounter (Signed)
Pt came in for her B12 injection today and would like to know when to get labs done for this and to check the Vit D level? She would like to do labs first and then come in for a office visit.

## 2011-02-04 NOTE — Telephone Encounter (Signed)
I left voice message for pt and put future lab order in computer.

## 2011-02-13 ENCOUNTER — Other Ambulatory Visit (INDEPENDENT_AMBULATORY_CARE_PROVIDER_SITE_OTHER): Payer: 59

## 2011-02-13 DIAGNOSIS — E539 Vitamin B deficiency, unspecified: Secondary | ICD-10-CM

## 2011-02-13 DIAGNOSIS — E538 Deficiency of other specified B group vitamins: Secondary | ICD-10-CM

## 2011-02-13 LAB — VITAMIN B12: Vitamin B-12: 601 pg/mL (ref 211–911)

## 2011-02-13 NOTE — Progress Notes (Signed)
Addended by: Bonnye Fava on: 02/13/2011 12:52 PM   Modules accepted: Orders

## 2011-02-13 NOTE — Progress Notes (Signed)
Addended by: Bonnye Fava on: 02/13/2011 01:23 PM   Modules accepted: Orders

## 2011-02-18 ENCOUNTER — Ambulatory Visit (INDEPENDENT_AMBULATORY_CARE_PROVIDER_SITE_OTHER): Payer: 59 | Admitting: Internal Medicine

## 2011-02-18 ENCOUNTER — Encounter: Payer: Self-pay | Admitting: Internal Medicine

## 2011-02-18 VITALS — BP 90/68 | HR 80 | Ht 68.0 in | Wt 146.0 lb

## 2011-02-18 DIAGNOSIS — F411 Generalized anxiety disorder: Secondary | ICD-10-CM

## 2011-02-18 DIAGNOSIS — R109 Unspecified abdominal pain: Secondary | ICD-10-CM

## 2011-02-18 DIAGNOSIS — R1030 Lower abdominal pain, unspecified: Secondary | ICD-10-CM

## 2011-02-18 DIAGNOSIS — F419 Anxiety disorder, unspecified: Secondary | ICD-10-CM

## 2011-02-18 NOTE — Patient Instructions (Signed)
Follow-up with Dr. Perry as needed.  

## 2011-02-18 NOTE — Progress Notes (Signed)
HISTORY OF PRESENT ILLNESS:  Laura [name] is a 35 y.o. female with anxiety/depression, chronic headaches, endometriosis, GERD, and IBS. She was seen in the office 01/24/2011 with complaints of abdominal pain. See the dictation. She subsequently underwent colonoscopy and upper endoscopy. Colonoscopy was normal including intubation of the ileum. Upper endoscopy was normal including duodenal biopsies. A CT scan of the abdomen and pelvis was subsequently performed. This was entirely normal. She returns today for followup. He reports ongoing problems with fairly constant lower abdominal discomfort. Often exacerbated by meals. She states that the discomfort is improved when she applies direct pressure to her abdomen. Her weight is unchanged since her last visit. She has yet to followup with her gynecologist as previously recommended. He continues to do with significant stress related to change in jobs, homes, and schools for her children.  REVIEW OF SYSTEMS:  All non-GI ROS negative except for stress and fatigue.  Past Medical History  Diagnosis Date  . IBS (irritable bowel syndrome)   . Depression   . GERD (gastroesophageal reflux disease)   . Restless legs   . Insomnia   . Headache   . UTI (lower urinary tract infection)     sees Dr Vernie Ammons  . Anemia   . Anxiety     Past Surgical History  Procedure Date  . Tonsillectomy   . Abdominal hysterectomy Nov 2008  . Neck surgery JULY 1996    L side lymph node removal  . Wisdom tooth extraction   . Tubal ligation     Social History EMMILIA [name]  reports that she has never smoked. She has never used smokeless tobacco. She reports that she drinks alcohol. She reports that she does not use illicit drugs.  family history includes Alcohol abuse in her father; Aneurysm in her paternal grandfather; Coronary artery disease in her maternal grandmother; Diabetes in her maternal grandfather; Hypertension in her mother; and Thyroid disease in her  mother.  There is no history of Colon cancer, and Esophageal cancer, and Stomach cancer, .  Allergies  Allergen Reactions  . Magnesium Sulfate Shortness Of Breath  . Hydromorphone Hcl   . Iohexol Other (See Comments)    Upper respiratory (sneezing)  . Penicillins   . Levaquin Rash       PHYSICAL EXAMINATION: Vital signs: BP 90/68  Pulse 80  Ht 5\' 8"  (1.727 m)  Wt 146 lb (66.225 kg)  BMI 22.20 kg/m2 General: Well-developed, well-nourished, no acute distress HEENT: Sclerae are anicteric, conjunctiva pink. Oral mucosa intact Abdomen: soft, nontender, nondistended, no obvious ascites, no peritoneal signs, normal bowel sounds. No organomegaly. Extremities: No edema Psychiatric: alert and oriented x3. Cooperative    ASSESSMENT:  #1. Chronic abdominal discomfort. Negative extensive GI evaluation. Suspect functional cause   PLAN:  #1. Follow up with her gynecologist, Dr. Tenny Craw, to exclude a GYN source of lower abdominal discomfort #2. If GYN evaluation negative, she should followup with Dr. Clent Ridges for further evaluation and management of stress. #3. GI followup as needed

## 2011-02-19 ENCOUNTER — Telehealth: Payer: Self-pay

## 2011-02-19 NOTE — Telephone Encounter (Signed)
All she really needs is daily calcium 1500 mg, vitamin D 2000 units, and a B complex daily

## 2011-02-19 NOTE — Telephone Encounter (Signed)
Pt would like to know if she needs to continue on her otc vitamins and if so how may units.  Pls advise.

## 2011-02-19 NOTE — Progress Notes (Signed)
Quick Note:  Pt aware ______ 

## 2011-02-21 NOTE — Telephone Encounter (Signed)
Spoke with pt and went over below message.

## 2011-03-18 ENCOUNTER — Inpatient Hospital Stay: Admission: RE | Admit: 2011-03-18 | Payer: 59 | Source: Ambulatory Visit | Admitting: Family Medicine

## 2011-03-19 LAB — URINALYSIS, ROUTINE W REFLEX MICROSCOPIC
Bilirubin Urine: NEGATIVE
Hgb urine dipstick: NEGATIVE
Ketones, ur: NEGATIVE
Nitrite: NEGATIVE
pH: 7.5

## 2011-03-19 LAB — COMPREHENSIVE METABOLIC PANEL
BUN: 8
CO2: 30
Calcium: 9.5
Creatinine, Ser: 0.71
GFR calc non Af Amer: >60
Glucose, Bld: 90
Sodium: 138
Total Protein: 7.1

## 2011-03-19 LAB — HEMOGLOBIN: Hemoglobin: 12.3

## 2011-03-19 LAB — CBC
HCT: 32.2 — ABNORMAL LOW
HCT: 38.9
Hemoglobin: 11.1 — ABNORMAL LOW
Hemoglobin: 13.4
MCHC: 34.5
MCV: 89.4
MCV: 90
RBC: 3.58 — ABNORMAL LOW
RBC: 3.8 — ABNORMAL LOW
RBC: 4.35
RDW: 12.7
WBC: 4.9
WBC: 6.7

## 2011-03-19 LAB — APTT: aPTT: 31

## 2011-03-19 LAB — HEMATOCRIT: HCT: 35.8 — ABNORMAL LOW

## 2011-03-19 LAB — PROTIME-INR
INR: 0.9
Prothrombin Time: 12

## 2011-03-19 LAB — PREGNANCY, URINE: Preg Test, Ur: NEGATIVE

## 2011-04-02 ENCOUNTER — Telehealth: Payer: Self-pay | Admitting: *Deleted

## 2011-04-02 NOTE — Telephone Encounter (Signed)
Pt called c/o night sweats, so bad that she has to change bed sheets in the middle of the night. Was requesting advise. Pt informed that she needs ov to discuss with dr fry so he can dx.  Pt explained about going on cobra and needs to wait until effective,  Pt was advise she does need ov and we would try to make appointment for her times.

## 2011-04-09 ENCOUNTER — Ambulatory Visit (INDEPENDENT_AMBULATORY_CARE_PROVIDER_SITE_OTHER): Payer: 59 | Admitting: Family Medicine

## 2011-04-09 ENCOUNTER — Encounter: Payer: Self-pay | Admitting: Family Medicine

## 2011-04-09 VITALS — BP 112/78 | HR 107 | Temp 97.9°F | Wt 150.0 lb

## 2011-04-09 DIAGNOSIS — Z23 Encounter for immunization: Secondary | ICD-10-CM

## 2011-04-09 DIAGNOSIS — R61 Generalized hyperhidrosis: Secondary | ICD-10-CM

## 2011-04-09 DIAGNOSIS — R109 Unspecified abdominal pain: Secondary | ICD-10-CM

## 2011-04-09 NOTE — Progress Notes (Signed)
  Subjective:    Patient ID: Laura [name], female    DOB: [birth year], 35 y.o.   MRN: [id number]  HPI Here to discuss night sweats and abdominal pains. She began to have sweats about 6 months ago, and they are getting worse. She has not seen Dr. Tenny Craw, her GYN, for quite awhile. She has had a complete workup with Dr. Marina Goodell for her pains, and he has not been able to find a GI cause for these. In general she feels better lately than she has for years. Sh has more energy and she is a lot happier. 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 She is very pleased with the new job. She has been on oral contraceptives for almost a year. She has no menses of course after her hysterectomy, but her ovaries are intact.    Review of Systems  Constitutional: Positive for diaphoresis. Negative for fever, chills, activity change, appetite change, fatigue and unexpected weight change.  Respiratory: Negative.   Cardiovascular: Negative.   Gastrointestinal: Negative for nausea, vomiting, diarrhea, constipation and blood in stool.  Genitourinary: Negative.        Objective:   Physical Exam  Constitutional: She appears well-developed and well-nourished.  Neck: No thyromegaly present.  Pulmonary/Chest: Effort normal and breath sounds normal.  Abdominal: Soft. Bowel sounds are normal. She exhibits no distension and no mass. There is no tenderness. There is no rebound and no guarding.  Lymphadenopathy:    She has no cervical adenopathy.          Assessment & Plan:  I think the most likely etiology of her sweats is hormonal, and her BCP may need to be changed. Also, she has a hx of endometriosis, and I think this is a likely source of her abdominal pains. Advised her to see Dr. Tenny Craw soon about all of these issues.

## 2011-04-09 NOTE — Progress Notes (Signed)
Addended by: Aniceto Boss A on: 04/09/2011 09:42 AM   Modules accepted: Orders

## 2011-10-15 ENCOUNTER — Telehealth: Payer: Self-pay | Admitting: Family Medicine

## 2011-10-15 NOTE — Telephone Encounter (Signed)
Called pt and told her that Dr Clent Ridges recommended pt to come in for ov to discuss possible topical or oral treatment. Pt says that she works in [place] and it would be very difficult for her to sch an ov. Pt said that she was going to contact pharmacy to see if there was an otc med.

## 2011-10-15 NOTE — Telephone Encounter (Signed)
Pt called and is having some acne issues and wants to know if there is a med noted in her chart, that Dr Clent Ridges may have prescribed for pt, for acne?  Pts not sure who had prescribed the med.  Pt has a pituitary tumor and can not take birth control. Pt req call back from nurse. Pt uses [facility] in [place] on [address].

## 2011-10-15 NOTE — Telephone Encounter (Signed)
The only thing that I can see we used which may have helped acne was a BCP, which she is not to take now. Have her set up an OV to discuss. We need to decide about topical vs oral treatments

## 2011-11-10 ENCOUNTER — Other Ambulatory Visit: Payer: Self-pay | Admitting: Family Medicine

## 2012-01-20 ENCOUNTER — Telehealth: Payer: Self-pay | Admitting: Family Medicine

## 2012-01-20 NOTE — Telephone Encounter (Signed)
Please advise 

## 2012-01-20 NOTE — Telephone Encounter (Signed)
Pt called and said that her endocrinologist is req that pt get some labs drawn. Pt is req to get labs drawn at [facility]. Pt has a lab order from Dr Saul Fordyce. The labs needed if for Prolactin lvl. Pls advise if ok for pt to bring order to [facility].

## 2012-01-21 NOTE — Telephone Encounter (Signed)
NO we cannot draw labs here from other doctors. She would need to go to [facility] outpatient lab

## 2012-01-21 NOTE — Telephone Encounter (Signed)
Called pt and notified her that Dr Clent Ridges said that we can not draw labs here from other doctors and that she will need to go to [facility] outpatient lab as noted.

## 2012-01-22 ENCOUNTER — Encounter: Payer: Self-pay | Admitting: *Deleted

## 2012-01-22 ENCOUNTER — Emergency Department
Admission: EM | Admit: 2012-01-22 | Discharge: 2012-01-22 | Disposition: A | Discharge status: Home / Self Care | Payer: 59 | Source: Home / Self Care | Attending: Family Medicine | Admitting: Family Medicine

## 2012-01-22 DIAGNOSIS — J069 Acute upper respiratory infection, unspecified: Secondary | ICD-10-CM

## 2012-01-22 DIAGNOSIS — M940 Chondrocostal junction syndrome [Tietze]: Secondary | ICD-10-CM

## 2012-01-22 MED ORDER — BENZONATATE 200 MG PO CAPS: 200.0000 mg | ORAL_CAPSULE | Freq: Every day | ORAL | Status: DC

## 2012-01-22 MED ORDER — GUAIFENESIN ER 600 MG PO TB12: 1200.0000 mg | ORAL_TABLET | Freq: Two times a day (BID) | ORAL | Status: DC

## 2012-01-22 NOTE — ED Provider Notes (Signed)
History     CSN: [id number]  Arrival date & time 01/22/12  1608   First MD Initiated Contact with Patient 01/22/12 1638      Chief Complaint  Patient presents with  . Hoarse      HPI Comments: Patient complains of fatigue and low grade fever for a week.  Over the past two days she has felt the need to clear her throat frequently and today has become hoarse.  She has developed myalgias and noticed mild soreness in her neck.  She has developed a mild headache and noticed tightness in her anterior chest today.   She has had no improvement with Daquil and Nyquil.  The history is provided by the patient.    Past Medical History  Diagnosis Date  . IBS (irritable bowel syndrome)   . Depression   . GERD (gastroesophageal reflux disease)   . Restless legs   . Insomnia   . Headache   . UTI (lower urinary tract infection)     sees Dr Vernie Ammons  . Anemia   . Anxiety     Past Surgical History  Procedure Date  . Tonsillectomy   . Abdominal hysterectomy Nov 2008  . Neck surgery JULY 1996    L side lymph node removal  . Wisdom tooth extraction   . Tubal ligation     Family History  Problem Relation Age of Onset  . Alcohol abuse Father   . Coronary artery disease Maternal Grandmother     paternal grandmother  . Diabetes Maternal Grandfather   . Hypertension Mother     father, MGF, MGM, PGF, PGM  . Thyroid disease Mother     maternal grandmother  . Colon cancer Neg Hx   . Esophageal cancer Neg Hx   . Stomach cancer Neg Hx   . Aneurysm Paternal Grandfather     aortic    History  Substance Use Topics  . Smoking status: Never Smoker   . Smokeless tobacco: Never Used  . Alcohol Use: Yes     occ    OB History    Grav Para Term Preterm Abortions TAB SAB Ect Mult Living                  Review of Systems + mild sore throat No cough No pleuritic pain, but complains of tightness in the anterior chest No wheezing + hoarse No nasal congestion + post-nasal  drainage No sinus pain/pressure No itchy/red eyes No earache No hemoptysis No SOB + low grade fever, + chills No nausea No vomiting No abdominal pain No diarrhea No urinary symptoms No skin rashes + fatigue + myalgias + headache Used OTC meds without relief  Allergies  Magnesium sulfate; Dilaudid; Hydromorphone hcl; Iohexol; Ivp dye; Penicillins; and Levofloxacin  Home Medications   Current Outpatient Rx  Name Route Sig Dispense Refill  . CABERGOLINE 0.5 MG PO TABS Oral Take 0.25 mg by mouth 2 (two) times a week.    Marland Kitchen DOXYCYCLINE HYCLATE 100 MG PO CPEP Oral Take 100 mg by mouth 2 (two) times daily.    Marland Kitchen LANSOPRAZOLE 30 MG PO CPDR Oral Take 30 mg by mouth daily.    Marland Kitchen BENZONATATE 200 MG PO CAPS Oral Take 1 capsule (200 mg total) by mouth at bedtime. Take as needed for cough 12 capsule 0  . BUPROPION HCL ER (XL) 300 MG PO TB24  TAKE 1 TABLET BY MOUTH IN THE MORNING 30 tablet 6  . VITAMIN D 1000 UNITS  PO TABS Oral Take 1,000 Units by mouth daily. Take 2     . DICYCLOMINE HCL 10 MG PO CAPS Oral Take 10 mg by mouth 4 (four) times daily -  before meals and at bedtime.      Marland Kitchen DIPHENHYDRAMINE HCL 50 MG PO CAPS  Pt to take by mouth 1 hour prior to CT Scan. 1 capsule 0  . GLYCOPYRROLATE 2 MG PO TABS Oral Take 1 tablet (2 mg total) by mouth 2 (two) times daily. 60 tablet 1  . IBUPROFEN 800 MG PO TABS Oral Take 800 mg by mouth every 8 (eight) hours as needed.      Marland Kitchen LORAZEPAM 0.5 MG PO TABS Oral Take 1 tablet (0.5 mg total) by mouth every 6 (six) hours as needed. 60 tablet 5  . NON FORMULARY  Vitamin B 12 injection weekly     . ORTHO TRI-CYCLEN (28) PO Oral Take 1 tablet by mouth daily.      Marland Kitchen OMEPRAZOLE 40 MG PO CPDR  EVERY DAY 30 capsule 6  . ONDANSETRON HCL 4 MG PO TABS Oral Take 1 tablet (4 mg total) by mouth daily as needed for nausea. 30 tablet 0    Take 1 tablet every 8 hours as needed for nausea  . PREDNISONE 50 MG PO TABS  Pt to take 1 pill at 13 hours, 7 hours, and 1 hour prior  to CT Scan 3 tablet 0  . TEMAZEPAM 30 MG PO CAPS Oral Take 30 mg by mouth at bedtime as needed.        BP 101/63  Pulse 77  Temp 98.6 F (37 C) (Oral)  Resp 18  Ht 5\' 8"  (1.727 m)  Wt 157 lb 8 oz (71.442 kg)  BMI 23.95 kg/m2  SpO2 100%  Physical Exam Nursing notes and Vital Signs reviewed. Appearance:  Patient appears healthy, stated age, and in no acute distress Eyes:  Pupils are equal, round, and reactive to light and accomodation.  Extraocular movement is intact.  Conjunctivae are not inflamed  Ears:  Canals normal.  Tympanic membranes normal.  Nose:  Mildly congested turbinates.  No sinus tenderness.     Pharynx:  Normal Neck:  Supple.  Slightly tender shotty posterior nodes are palpated bilaterally  Lungs:  Clear to auscultation.  Breath sounds are equal.  Chest:  Distinct tenderness to palpation over the mid-sternum.  Heart:  Regular rate and rhythm without murmurs, rubs, or gallops.  Abdomen:  Nontender without masses or hepatosplenomegaly.  Bowel sounds are present.  No CVA or flank tenderness.  Extremities:  No edema.  No calf tenderness Skin:  No rash present.   ED Course  Procedures  none      1. Acute upper respiratory infections of unspecified site; suspect early viral URI   2. Costochondritis, acute       MDM   There is no evidence of bacterial infection today.   Treat symptomatically for now  Prescription written for Benzonatate (Tessalon) to take at bedtime for night-time cough.  Take plain Mucinex (guaifenesin) twice daily for cough and congestion.  Increase fluid intake, rest. May use Afrin nasal spray (or generic oxymetazoline) twice daily for about 5 days.  Also recommend using saline nasal spray several times daily and saline nasal irrigation (AYR is a common brand) Stop all antihistamines for now, and other non-prescription cough/cold preparations. If fever increases after about 5 days, may increase doxycycline to twice daily. Follow-up with  family doctor if not improving  7 to 10 days.         Lattie Haw, MD 01/23/12 0930

## 2012-01-22 NOTE — ED Notes (Signed)
Pt c/o hoarseness and throat clearing x 4 days. She has a hx of GERD, she took OTC prevacid last night.

## 2012-01-25 ENCOUNTER — Telehealth: Payer: Self-pay | Admitting: Family Medicine

## 2012-01-29 ENCOUNTER — Encounter: Payer: Self-pay | Admitting: Family Medicine

## 2012-01-29 ENCOUNTER — Ambulatory Visit (INDEPENDENT_AMBULATORY_CARE_PROVIDER_SITE_OTHER): Payer: 59 | Admitting: Family Medicine

## 2012-01-29 VITALS — BP 106/68 | HR 83 | Temp 98.9°F | Wt 157.0 lb

## 2012-01-29 DIAGNOSIS — B9789 Other viral agents as the cause of diseases classified elsewhere: Secondary | ICD-10-CM

## 2012-01-29 DIAGNOSIS — B349 Viral infection, unspecified: Secondary | ICD-10-CM

## 2012-01-29 LAB — POCT URINALYSIS DIPSTICK
Blood, UA: NEGATIVE
Glucose, UA: NEGATIVE
Nitrite, UA: NEGATIVE
Protein, UA: NEGATIVE
Urobilinogen, UA: 0.2

## 2012-01-29 LAB — BASIC METABOLIC PANEL
CO2: 28 mEq/L (ref 19–32)
Calcium: 9.3 mg/dL (ref 8.4–10.5)
Chloride: 106 mEq/L (ref 96–112)
Creatinine, Ser: 0.8 mg/dL (ref 0.4–1.2)
Glucose, Bld: 76 mg/dL (ref 70–99)

## 2012-01-29 LAB — HEPATIC FUNCTION PANEL
ALT: 13 U/L (ref 0–35)
Albumin: 4.2 g/dL (ref 3.5–5.2)
Total Protein: 7.2 g/dL (ref 6.0–8.3)

## 2012-01-29 LAB — CBC WITH DIFFERENTIAL/PLATELET
Basophils Absolute: 0 10*3/uL (ref 0.0–0.1)
Eosinophils Relative: 1.1 % (ref 0.0–5.0)
HCT: 38.8 % (ref 36.0–46.0)
Lymphs Abs: 1.3 10*3/uL (ref 0.7–4.0)
MCV: 93.1 fl (ref 78.0–100.0)
Monocytes Absolute: 0.3 10*3/uL (ref 0.1–1.0)
Neutrophils Relative %: 48.4 % (ref 43.0–77.0)
Platelets: 176 10*3/uL (ref 150.0–400.0)
RDW: 13.4 % (ref 11.5–14.6)
WBC: 3.2 10*3/uL — ABNORMAL LOW (ref 4.5–10.5)

## 2012-01-29 LAB — TSH: TSH: 0.62 u[IU]/mL (ref 0.35–5.50)

## 2012-01-29 NOTE — Progress Notes (Signed)
  Subjective:    Patient ID: Laura [name], female    DOB: 1975/09/21, 36 y.o.   MRN: [id number]  HPI Here for 2 weeks of vague symptoms including muscle aches, dull HAs, intermittent STs, nausea without vomiting, and alternating feeling of being hot or cold. No documented fevers. No urinary or bowel changes. No cough. No recent tick or insect bites. No recent international travel. She saw Urgent Care on 01-22-12 and they felt this was a viral illness. No testing was done. She takes one Doxycycline daily for acne.    Review of Systems  Constitutional: Positive for chills and fatigue.  HENT: Positive for sore throat. Negative for ear pain, congestion, rhinorrhea, neck pain, neck stiffness and sinus pressure.   Eyes: Negative.   Respiratory: Negative.   Cardiovascular: Negative.   Gastrointestinal: Positive for nausea. Negative for vomiting, abdominal pain, diarrhea, constipation, blood in stool, abdominal distention and rectal pain.  Genitourinary: Negative.   Musculoskeletal: Positive for myalgias. Negative for joint swelling and arthralgias.  Skin: Negative.   Neurological: Positive for headaches. Negative for dizziness, tremors, seizures, syncope, speech difficulty, weakness and numbness.  Hematological: Negative.        Objective:   Physical Exam  Constitutional: She is oriented to person, place, and time. She appears well-developed and well-nourished.  HENT:  Right Ear: External ear normal.  Left Ear: External ear normal.  Nose: Nose normal.  Mouth/Throat: Oropharynx is clear and moist. No oropharyngeal exudate.  Eyes: Conjunctivae are normal. Pupils are equal, round, and reactive to light. No scleral icterus.  Neck: Normal range of motion. Neck supple. No thyromegaly present.  Cardiovascular: Normal rate, regular rhythm, normal heart sounds and intact distal pulses.   No murmur heard. Pulmonary/Chest: Effort normal and breath sounds normal.  Abdominal: Soft. Bowel sounds are  normal. She exhibits no distension and no mass. There is no tenderness. There is no rebound and no guarding.  Lymphadenopathy:    She has no cervical adenopathy.  Neurological: She is alert and oriented to person, place, and time.  Skin: Skin is warm and dry. No rash noted. No erythema.          Assessment & Plan:  Viral illness, possible mononucleosis. Get labs today. Rest, drink fluids.

## 2012-01-30 LAB — EPSTEIN-BARR VIRUS VCA, IGG: EBV VCA IgG: >750 U/mL — ABNORMAL HIGH (ref <18.0)

## 2012-01-30 LAB — EPSTEIN-BARR VIRUS VCA, IGM: EBV VCA IgM: <10 U/mL (ref <36.0)

## 2012-01-30 NOTE — Progress Notes (Signed)
Quick Note:  I spoke with pt ______ 

## 2012-01-31 NOTE — Progress Notes (Signed)
Quick Note:  I spoke with pt ______ 

## 2012-05-19 ENCOUNTER — Telehealth: Payer: Self-pay | Admitting: Family Medicine

## 2012-05-19 DIAGNOSIS — Z0000 Encounter for general adult medical examination without abnormal findings: Secondary | ICD-10-CM

## 2012-05-19 NOTE — Telephone Encounter (Signed)
Pt requested to go to [facility] lab for cpx lab appt on 06/25/12.  Please enter lab orders in system for pt to go to [facility].

## 2012-05-20 NOTE — Telephone Encounter (Signed)
I put future orders in computer.

## 2012-06-13 ENCOUNTER — Other Ambulatory Visit: Payer: Self-pay | Admitting: Family Medicine

## 2012-06-18 NOTE — Telephone Encounter (Signed)
Refill both for one year. 

## 2012-07-01 ENCOUNTER — Other Ambulatory Visit (INDEPENDENT_AMBULATORY_CARE_PROVIDER_SITE_OTHER): Payer: 59

## 2012-07-01 DIAGNOSIS — Z0000 Encounter for general adult medical examination without abnormal findings: Secondary | ICD-10-CM

## 2012-07-01 LAB — CBC WITH DIFFERENTIAL/PLATELET
Basophils Absolute: 0 10*3/uL (ref 0.0–0.1)
Eosinophils Absolute: 0.1 10*3/uL (ref 0.0–0.7)
Hemoglobin: 12.5 g/dL (ref 12.0–15.0)
Lymphocytes Relative: 35.3 % (ref 12.0–46.0)
MCHC: 33.6 g/dL (ref 30.0–36.0)
Monocytes Relative: 9.3 % (ref 3.0–12.0)
Neutro Abs: 1.7 10*3/uL (ref 1.4–7.7)
Neutrophils Relative %: 51.6 % (ref 43.0–77.0)
Platelets: 183 10*3/uL (ref 150.0–400.0)
RDW: 12.8 % (ref 11.5–14.6)

## 2012-07-01 LAB — URINALYSIS
Ketones, ur: NEGATIVE
Leukocytes, UA: NEGATIVE
Nitrite: NEGATIVE
Specific Gravity, Urine: 1.025 (ref 1.000–1.030)
Urobilinogen, UA: 0.2 (ref 0.0–1.0)
pH: 6.5 (ref 5.0–8.0)

## 2012-07-01 LAB — BASIC METABOLIC PANEL
BUN: 17 mg/dL (ref 6–23)
Chloride: 105 mEq/L (ref 96–112)
GFR: 86.98 mL/min (ref >60.00)
Potassium: 4.8 mEq/L (ref 3.5–5.1)

## 2012-07-01 LAB — HEPATIC FUNCTION PANEL
ALT: 39 U/L — ABNORMAL HIGH (ref 0–35)
AST: 57 U/L — ABNORMAL HIGH (ref 0–37)
Bilirubin, Direct: 0 mg/dL (ref 0.0–0.3)
Total Bilirubin: 0.4 mg/dL (ref 0.3–1.2)
Total Protein: 7 g/dL (ref 6.0–8.3)

## 2012-07-01 LAB — LIPID PANEL
Cholesterol: 156 mg/dL (ref 0–200)
HDL: 46.2 mg/dL (ref >39.00)

## 2012-07-01 LAB — TSH: TSH: 1.03 u[IU]/mL (ref 0.35–5.50)

## 2012-07-02 ENCOUNTER — Encounter: Payer: 59 | Admitting: Family Medicine

## 2012-07-03 NOTE — Progress Notes (Signed)
Quick Note:  Pt has CPE on 07/09/11 will go over then. ______

## 2012-07-08 ENCOUNTER — Encounter: Payer: 59 | Admitting: Family Medicine

## 2012-07-20 ENCOUNTER — Encounter: Payer: Self-pay | Admitting: Family Medicine

## 2012-07-20 ENCOUNTER — Ambulatory Visit (INDEPENDENT_AMBULATORY_CARE_PROVIDER_SITE_OTHER): Payer: 59 | Admitting: Family Medicine

## 2012-07-20 VITALS — BP 104/66 | HR 97 | Temp 98.0°F | Ht 68.5 in | Wt 167.0 lb

## 2012-07-20 DIAGNOSIS — Z0000 Encounter for general adult medical examination without abnormal findings: Secondary | ICD-10-CM

## 2012-07-20 DIAGNOSIS — D352 Benign neoplasm of pituitary gland: Secondary | ICD-10-CM

## 2012-07-20 DIAGNOSIS — E229 Hyperfunction of pituitary gland, unspecified: Secondary | ICD-10-CM

## 2012-07-21 ENCOUNTER — Encounter: Payer: Self-pay | Admitting: Family Medicine

## 2012-07-21 DIAGNOSIS — E229 Hyperfunction of pituitary gland, unspecified: Secondary | ICD-10-CM | POA: Insufficient documentation

## 2012-07-21 NOTE — Progress Notes (Signed)
  Subjective:    Patient ID: Laura [name], female    DOB: 25-Feb-1976, 37 y.o.   MRN: [id number]  HPI 37 yr old female for a cpx. She feels well from a physical standpoint but she is frustrated at her inability to lose weight. She just finished a month of boot camp which met 3 days a week and she is watching a strict diet, but she has not lost weight. She recently started seeing [facility] for therapy, and she thinks this will be helpful. She has been struggling with her depression and anxiety recently. Also she was diagnosed with a pituitary microadenoma about 6 months ago, and she is seeing an endocrinologist by the name of Dr. Jimmye Norman in [place] for this. This is a prolactinoma and it measures about 9mm in size. They are trying to shrink this with medications and to avoid surgery if possible.    Review of Systems  Constitutional: Negative.   HENT: Negative.   Eyes: Negative.   Respiratory: Negative.   Cardiovascular: Negative.   Gastrointestinal: Negative.   Genitourinary: Negative for dysuria, urgency, frequency, hematuria, flank pain, decreased urine volume, enuresis, difficulty urinating, pelvic pain and dyspareunia.  Musculoskeletal: Negative.   Skin: Negative.   Neurological: Negative.   Psychiatric/Behavioral: Positive for dysphoric mood. Negative for suicidal ideas, hallucinations, behavioral problems, confusion, sleep disturbance, self-injury, decreased concentration and agitation. The patient is nervous/anxious. The patient is not hyperactive.        Objective:   Physical Exam  Constitutional: She is oriented to person, place, and time. She appears well-developed and well-nourished. No distress.  HENT:  Head: Normocephalic and atraumatic.  Right Ear: External ear normal.  Left Ear: External ear normal.  Nose: Nose normal.  Mouth/Throat: Oropharynx is clear and moist. No oropharyngeal exudate.  Eyes: Conjunctivae and EOM are normal. Pupils are equal, round, and  reactive to light. No scleral icterus.  Neck: Normal range of motion. Neck supple. No JVD present. No thyromegaly present.  Cardiovascular: Normal rate, regular rhythm, normal heart sounds and intact distal pulses.  Exam reveals no gallop and no friction rub.   No murmur heard. Pulmonary/Chest: Effort normal and breath sounds normal. No respiratory distress. She has no wheezes. She has no rales. She exhibits no tenderness.  Abdominal: Soft. Bowel sounds are normal. She exhibits no distension and no mass. There is no tenderness. There is no rebound and no guarding.  Musculoskeletal: Normal range of motion. She exhibits no edema and no tenderness.  Lymphadenopathy:    She has no cervical adenopathy.  Neurological: She is alert and oriented to person, place, and time. She has normal reflexes. No cranial nerve deficit. She exhibits normal muscle tone. Coordination normal.  Skin: Skin is warm and dry. No rash noted. No erythema.  Psychiatric: She has a normal mood and affect. Her behavior is normal. Judgment and thought content normal.          Assessment & Plan:  Well exam. I encouraged her to work out 5-6 days a week and to continue her dietary efforts. Continue psychotherapy. Follow up with endocrinology.

## 2012-07-31 ENCOUNTER — Telehealth: Payer: Self-pay | Admitting: Family Medicine

## 2012-07-31 NOTE — Telephone Encounter (Signed)
Patient Information:  Caller Name: Noor  Phone: [phone]  Patient: Laura [name], Laura [name]  Gender: Female  DOB: Apr 10, 1976  Age: 37 Years  PCP: Gershon Crane [facility])  Pregnant: No  Office Follow Up:  Does the office need to follow up with this patient?: Yes  Instructions For The Office: Needs an OV today please.   Symptoms  Reason For Call & Symptoms: Has had worsening fatigue, extremities feel heavy, stays cold alot, has nausea.  Has problems staying awake at work.  Feels alot like she did in August 2013 when she was dx with Mono.  Her children have commited to her about tired she looks.  Reviewed Health History In EMR: Yes  Reviewed Medications In EMR: Yes  Reviewed Allergies In EMR: Yes  Reviewed Surgeries / Procedures: Yes  Date of Onset of Symptoms: 07/17/2012 OB / GYN:  LMP: Unknown  Guideline(s) Used:  Weakness (Generalized) and Fatigue  Disposition Per Guideline:   See Today in Office  Reason For Disposition Reached:   Moderate weakness (i.e., interferes with work, school, normal activities) and persists > 3 days  Advice Given:  N/A

## 2012-07-31 NOTE — Telephone Encounter (Signed)
Per Dr. Clent Ridges, okay for pt to schedule OV for Monday 08/03/12, and I did speak with pt.

## 2012-08-03 ENCOUNTER — Ambulatory Visit (INDEPENDENT_AMBULATORY_CARE_PROVIDER_SITE_OTHER): Payer: 59 | Admitting: Family Medicine

## 2012-08-03 ENCOUNTER — Encounter: Payer: Self-pay | Admitting: Family Medicine

## 2012-08-03 VITALS — BP 106/70 | HR 103 | Temp 98.3°F | Wt 164.0 lb

## 2012-08-03 DIAGNOSIS — E538 Deficiency of other specified B group vitamins: Secondary | ICD-10-CM | POA: Insufficient documentation

## 2012-08-03 DIAGNOSIS — E559 Vitamin D deficiency, unspecified: Secondary | ICD-10-CM

## 2012-08-03 LAB — HEPATIC FUNCTION PANEL
ALT: 15 U/L (ref 0–35)
Total Bilirubin: 0.5 mg/dL (ref 0.3–1.2)
Total Protein: 7.5 g/dL (ref 6.0–8.3)

## 2012-08-03 LAB — VITAMIN B12: Vitamin B-12: 576 pg/mL (ref 211–911)

## 2012-08-03 NOTE — Progress Notes (Signed)
  Subjective:    Patient ID: Laura [name], female    DOB: 03-06-1976, 37 y.o.   MRN: [id number]  HPI Here for several weeks of generalized fatigue and slight tingling in the hands. She has a hx of deficiencies of both vitamin D and B12. She had been talking oral vitamins but stopped about 6 months ago.    Review of Systems  Constitutional: Positive for fatigue.  Respiratory: Negative.   Cardiovascular: Negative.   Gastrointestinal: Negative.        Objective:   Physical Exam  Constitutional: She appears well-developed and well-nourished.  Neck: No thyromegaly present.  Cardiovascular: Normal rate, regular rhythm, normal heart sounds and intact distal pulses.   Pulmonary/Chest: Effort normal and breath sounds normal.  Lymphadenopathy:    She has no cervical adenopathy.          Assessment & Plan:  I think it likely that her vitamin levels are low again. Get labs today

## 2012-08-04 LAB — VITAMIN D 25 HYDROXY (VIT D DEFICIENCY, FRACTURES): Vit D, 25-Hydroxy: 19 ng/mL — ABNORMAL LOW (ref 30–89)

## 2012-08-04 MED ORDER — VITAMIN D (ERGOCALCIFEROL) 1.25 MG (50000 UNIT) PO CAPS: 50000.0000 [IU] | ORAL_CAPSULE | ORAL | Status: DC

## 2012-08-04 NOTE — Addendum Note (Signed)
Addended by: Aniceto Boss A on: 08/04/2012 02:25 PM   Modules accepted: Orders

## 2012-08-04 NOTE — Progress Notes (Signed)
Quick Note:  I spoke with pt, sent script e-scribe and released results to my chart. ______

## 2012-08-23 ENCOUNTER — Emergency Department
Admission: EM | Admit: 2012-08-23 | Discharge: 2012-08-23 | Disposition: A | Discharge status: Home / Self Care | Payer: 59 | Source: Home / Self Care | Attending: Family Medicine | Admitting: Family Medicine

## 2012-08-23 ENCOUNTER — Encounter: Payer: Self-pay | Admitting: Emergency Medicine

## 2012-08-23 ENCOUNTER — Emergency Department (INDEPENDENT_AMBULATORY_CARE_PROVIDER_SITE_OTHER): Payer: 59

## 2012-08-23 DIAGNOSIS — M62838 Other muscle spasm: Secondary | ICD-10-CM

## 2012-08-23 DIAGNOSIS — M542 Cervicalgia: Secondary | ICD-10-CM

## 2012-08-23 DIAGNOSIS — M25511 Pain in right shoulder: Secondary | ICD-10-CM

## 2012-08-23 DIAGNOSIS — M25519 Pain in unspecified shoulder: Secondary | ICD-10-CM

## 2012-08-23 DIAGNOSIS — M7520 Bicipital tendinitis, unspecified shoulder: Secondary | ICD-10-CM

## 2012-08-23 DIAGNOSIS — M7521 Bicipital tendinitis, right shoulder: Secondary | ICD-10-CM

## 2012-08-23 MED ORDER — CYCLOBENZAPRINE HCL 10 MG PO TABS: ORAL_TABLET | ORAL | Status: DC

## 2012-08-23 MED ORDER — KETOROLAC TROMETHAMINE 30 MG/ML IJ SOLN
30.0000 mg | Freq: Once | INTRAMUSCULAR | Status: AC
  Administered 2012-08-23: 30 mg via INTRAMUSCULAR

## 2012-08-23 MED ORDER — MELOXICAM 15 MG PO TABS: 15.0000 mg | ORAL_TABLET | Freq: Every day | ORAL | Status: DC

## 2012-08-23 NOTE — ED Provider Notes (Signed)
History     CSN: [id number]  Arrival date & time 08/23/12  1535   First MD Initiated Contact with Patient 08/23/12 1549      Chief Complaint  Patient presents with  . Neck Pain  . Back Pain      HPI Comments: The patient complains of a six day history of persistent pain in her right neck radiating to her right shoulder blade.  She notes that she also has chronic vague pain in her right shoulder.  She notes that she often has a "grinding" sensation in her right shoulder.  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  Patient is a 37 y.o. female presenting with neck injury. The history is provided by the patient and the spouse.  Neck Injury This is a new problem. Episode onset: 6 days ago. The problem occurs constantly. The problem has been gradually worsening. Associated symptoms include abdominal pain. Associated symptoms comments: Pain in right scapula. Exacerbated by: turning neck. Nothing relieves the symptoms. Treatments tried: Ibuprofen. The treatment provided mild relief.    Past Medical History  Diagnosis Date  . IBS (irritable bowel syndrome)   . Depression   . GERD (gastroesophageal reflux disease)   . Restless legs   . Insomnia   . Headache   . UTI (lower urinary tract infection)     sees Dr Vernie Ammons  . Anemia   . Anxiety   . Pituitary microadenoma with hyperprolactinemia     sees Dr. Jimmye Norman in [facility]    Past Surgical History  Procedure Laterality Date  . Tonsillectomy    . Abdominal hysterectomy  Nov 2008  . Neck surgery  JULY 1996    L side lymph node removal  . Wisdom tooth extraction    . Tubal ligation      Family History  Problem Relation Age of Onset  . Alcohol abuse Father   . Coronary artery disease Maternal Grandmother     paternal grandmother  . Diabetes Maternal Grandfather   . Hypertension Mother       father, MGF, MGM, PGF, PGM  . Thyroid disease Mother     maternal grandmother  . Colon cancer Neg Hx   . Esophageal cancer Neg Hx   . Stomach cancer Neg Hx   . Aneurysm Paternal Grandfather     aortic    History  Substance Use Topics  . Smoking status: Never Smoker   . Smokeless tobacco: Never Used  . Alcohol Use: No    OB History   Grav Para Term Preterm Abortions TAB SAB Ect Mult Living                  Review of Systems  Gastrointestinal: Positive for abdominal pain.       Patient also notes a history of recurring post-prandial abdominal discomfort.  She has had a negative GI workup in the past.  All other systems reviewed and are negative.    Allergies  Magnesium sulfate; Dilaudid; Hydromorphone hcl; Iohexol; Ivp dye; Penicillins; and Levofloxacin  Home Medications   Current Outpatient Rx  Name  Route  Sig  Dispense  Refill  . buPROPion (WELLBUTRIN XL) 300 MG 24 hr tablet      TAKE 1 TABLET BY MOUTH IN THE MORNING   30 tablet   11   . cabergoline (DOSTINEX) 0.5 MG tablet  Oral   Take 0.25 mg by mouth 2 (two) times a week.         . cholecalciferol (VITAMIN D) 1000 UNITS tablet   Oral   Take 1,000 Units by mouth daily. Take 2          . cyclobenzaprine (FLEXERIL) 10 MG tablet      Take one tab by mouth at bedtime for muscle spasm   10 tablet   0   . dicyclomine (BENTYL) 10 MG capsule   Oral   Take 10 mg by mouth 4 (four) times daily -  before meals and at bedtime.           Marland Kitchen doxycycline (DORYX) 100 MG DR capsule   Oral   Take 100 mg by mouth daily.          Marland Kitchen glycopyrrolate (ROBINUL-FORTE) 2 MG tablet   Oral   Take 1 tablet (2 mg total) by mouth 2 (two) times daily.   60 tablet   1   . ibuprofen (ADVIL,MOTRIN) 800 MG tablet   Oral   Take 800 mg by mouth every 8 (eight) hours as needed.           Marland Kitchen LORazepam (ATIVAN) 0.5 MG tablet   Oral   Take 1 tablet (0.5 mg total) by mouth every 6 (six) hours as needed.   60 tablet    5   . meloxicam (MOBIC) 15 MG tablet   Oral   Take 1 tablet (15 mg total) by mouth daily. Take with food each morning   15 tablet   0   . omeprazole (PRILOSEC) 40 MG capsule      EVERY DAY   30 capsule   11   . temazepam (RESTORIL) 30 MG capsule   Oral   Take 30 mg by mouth at bedtime as needed.           . Vitamin D, Ergocalciferol, (DRISDOL) 50000 UNITS CAPS   Oral   Take 1 capsule (50,000 Units total) by mouth every 7 (seven) days.   4 capsule   2     BP 112/72  Pulse 71  Temp(Src) 98 F (36.7 C) (Oral)  Ht 5\' 8"  (1.727 m)  Wt 165 lb (74.844 kg)  BMI 25.09 kg/m2  SpO2 100%  Physical Exam  Nursing note and vitals reviewed. Constitutional: She is oriented to person, place, and time. She appears well-developed and well-nourished. No distress.  HENT:  Head: Normocephalic.  Eyes: Conjunctivae are normal. Pupils are equal, round, and reactive to light.  Cardiovascular: Normal heart sounds.   Pulmonary/Chest: Breath sounds normal.  Musculoskeletal:       Right shoulder: She exhibits decreased range of motion, tenderness, pain and decreased strength. She exhibits no bony tenderness, no swelling, no effusion, no crepitus, no spasm and normal pulse.       Cervical back: She exhibits decreased range of motion, tenderness, pain and spasm. She exhibits no bony tenderness, no swelling, no edema, no deformity and no laceration.       Back:  There is tenderness over the right sternocleidomastoid muscle and right trapezius muscle as noted on diagram.  Tenderness extends to the medial edge of right scapula.  There is tenderness over the right long head of biceps tendon at its insertion.  Apley's scratch test is positive for decreased abduction and external rotation.  Neer's test is positive for sub-acromial impingement.  Distal neurovascular function is intact.   Neurological: She is alert  and oriented to person, place, and time.  Skin: Skin is warm and dry. No rash noted.     ED Course  Procedures  none   Dg Cervical Spine 2-3 Views  08/23/2012  *RADIOLOGY REPORT*  Clinical Data: Right-sided neck pain.  No known injury.  CERVICAL SPINE - 2-3 VIEW  Comparison: None.  Findings: No evidence of cervical spine fracture or subluxation. No evidence of prevertebral soft tissue swelling.  Disc spaces are preserved.  No evidence of facet DJD.  No other bone abnormality identified. Loss of normal cervical lordosis noted, which may be due to muscle spasm or patient positioning.  IMPRESSION:  1.  Loss of normal cervical lordosis, which may be due to muscle spasm or patient positioning; clinical correlation is recommended. 2.  Otherwise negative exam.   Original Report Authenticated By: Myles Rosenthal, M.D.    Dg Shoulder Right  08/23/2012  *RADIOLOGY REPORT*  Clinical Data: Right shoulder and neck pain for the past 6 days. No known injuries.  RIGHT SHOULDER - 2+ VIEW  Comparison: None.  Findings: No evidence of acute fracture or glenohumeral dislocation.  Subacromial space well preserved.  Acromioclavicular joint intact without significant degenerative change.  No intrinsic osseous abnormalities.  Vacuum phenomenon within the labrum.  IMPRESSION: No acute or significant abnormality.   Original Report Authenticated By: Hulan Saas, M.D.      1. Right shoulder pain; suspect sub-acromial impingement   2. Biceps tendonitis, right   3. Neck muscle spasm       MDM  Begin Mobic each morning, and Flexeril at bedtime.  Apply ice pack two or three times daily to neck and right shoulder.  Begin range of motion exercises ([employer] information and instruction handouts given)  Arrange follow-up appt with Dr. Rodney Langton for further management. Also recommend a followup appt with GI for her persistent GI symptoms.       Lattie Haw, MD 08/23/12 [phone]

## 2012-08-23 NOTE — ED Notes (Signed)
Reports onset of pain right posterior neck and shoulder blade 6 days ago; no known injury or malposition; has progressively gotten worse despite Ibuprofen q 4 hours.

## 2012-08-26 ENCOUNTER — Encounter: Payer: Self-pay | Admitting: Sports Medicine

## 2012-08-26 ENCOUNTER — Ambulatory Visit (INDEPENDENT_AMBULATORY_CARE_PROVIDER_SITE_OTHER): Payer: 59 | Admitting: Sports Medicine

## 2012-08-26 VITALS — BP 109/67 | HR 74 | Wt 168.0 lb

## 2012-08-26 DIAGNOSIS — M5412 Radiculopathy, cervical region: Secondary | ICD-10-CM

## 2012-08-26 DIAGNOSIS — M25519 Pain in unspecified shoulder: Secondary | ICD-10-CM

## 2012-08-26 DIAGNOSIS — M25511 Pain in right shoulder: Secondary | ICD-10-CM | POA: Insufficient documentation

## 2012-08-26 MED ORDER — CYCLOBENZAPRINE HCL 10 MG PO TABS: ORAL_TABLET | ORAL | Status: DC

## 2012-08-26 MED ORDER — PREDNISONE 10 MG PO TABS: ORAL_TABLET | ORAL | Status: DC

## 2012-08-26 MED ORDER — MELOXICAM 15 MG PO TABS: ORAL_TABLET | ORAL | Status: DC

## 2012-08-26 NOTE — Assessment & Plan Note (Signed)
Likely related to subacromial bursitis. Formal physical therapy, Mobic. Return in 4 weeks, consider injection if no better.

## 2012-08-26 NOTE — Assessment & Plan Note (Signed)
We will start conservatively with formal PT, prednisone, Mobic, Flexeril at bedtime. Return in 4 weeks.

## 2012-08-26 NOTE — Progress Notes (Signed)
  Subjective:    I'm seeing this patient as a consultation for:  Dr. Cathren Harsh  CC: Neck and shoulder pain  HPI: Neck pain: Has been present on and off for years, localized to the posterior aspect of her mid right cervical spine. Occasionally she does get numbness and tingling radiating down to her fourth and fifth fingers. Pain is worse with turning the neck to the ipsilateral side, it is also worse with flexion, with extreme extension she also gets some pain. Pain symptoms are moderate, slowly worsening, denies any bowel or bladder dysfunction.  Right shoulder pain: Localized over the anterior joint line, worse with reaching behind her back. Ergonomics were poor at work. Symptoms are moderate, worsening.  Past medical history, Surgical history, Family history not pertinant except as noted below, Social history, Allergies, and medications have been entered into the medical record, reviewed, and no changes needed.   Review of Systems: No headache, visual changes, nausea, vomiting, diarrhea, constipation, dizziness, abdominal pain, skin rash, fevers, chills, night sweats, weight loss, swollen lymph nodes, body aches, joint swelling, muscle aches, chest pain, shortness of breath, mood changes, visual or auditory hallucinations.   Objective:   General: Well Developed, well nourished, and in no acute distress.  Neuro/Psych: Alert and oriented x3, extra-ocular muscles intact, able to move all 4 extremities, sensation grossly intact. Skin: Warm and dry, no rashes noted.  Respiratory: Not using accessory muscles, speaking in full sentences, trachea midline.  Cardiovascular: Pulses palpable, no extremity edema. Abdomen: Does not appear distended. Right Shoulder: Inspection reveals no abnormalities, atrophy or asymmetry. Palpation is normal with no tenderness over AC joint or bicipital groove. ROM is full in all planes. Rotator cuff strength weak to supraspinatus and subscapularis Positive empty can  sign, negative Hawkins and Neer's test. Speeds and Yergason's tests normal. No labral pathology noted with negative Obrien's, negative clunk and good stability. Normal scapular function observed. No painful arc and no drop arm sign. No apprehension sign Neck: Inspection unremarkable. No palpable stepoffs. Negative Spurling's maneuver. Full neck range of motion Grip strength and sensation normal in bilateral hands Strength good C4 to T1 distribution No sensory change to C4 to T1 Negative Hoffman sign bilaterally Reflexes normal  C-spine x-ray shows straightening of the normal cervical lordosis.  Right shoulder x-rays are unremarkable with the exception of a mild vacuum phenomenon in the labrum. Impression and Recommendations:   This case required medical decision making of moderate complexity.

## 2012-08-28 ENCOUNTER — Ambulatory Visit: Payer: 59 | Attending: Sports Medicine | Admitting: Physical Therapy

## 2012-08-28 DIAGNOSIS — M6281 Muscle weakness (generalized): Secondary | ICD-10-CM | POA: Insufficient documentation

## 2012-08-28 DIAGNOSIS — M2560 Stiffness of unspecified joint, not elsewhere classified: Secondary | ICD-10-CM | POA: Insufficient documentation

## 2012-08-28 DIAGNOSIS — M2550 Pain in unspecified joint: Secondary | ICD-10-CM | POA: Insufficient documentation

## 2012-08-28 DIAGNOSIS — IMO0001 Reserved for inherently not codable concepts without codable children: Secondary | ICD-10-CM | POA: Insufficient documentation

## 2012-08-31 ENCOUNTER — Ambulatory Visit: Payer: 59 | Admitting: Physical Therapy

## 2012-09-09 ENCOUNTER — Encounter: Payer: 59 | Admitting: Physical Therapy

## 2012-09-17 ENCOUNTER — Ambulatory Visit: Payer: 59 | Attending: Sports Medicine | Admitting: Physical Therapy

## 2012-09-17 DIAGNOSIS — M2550 Pain in unspecified joint: Secondary | ICD-10-CM | POA: Insufficient documentation

## 2012-09-17 DIAGNOSIS — M2560 Stiffness of unspecified joint, not elsewhere classified: Secondary | ICD-10-CM | POA: Insufficient documentation

## 2012-09-17 DIAGNOSIS — IMO0001 Reserved for inherently not codable concepts without codable children: Secondary | ICD-10-CM | POA: Insufficient documentation

## 2012-09-17 DIAGNOSIS — M6281 Muscle weakness (generalized): Secondary | ICD-10-CM | POA: Insufficient documentation

## 2012-09-24 ENCOUNTER — Ambulatory Visit: Payer: 59 | Admitting: Physical Therapy

## 2012-09-28 ENCOUNTER — Ambulatory Visit: Payer: 59 | Admitting: Sports Medicine

## 2012-10-05 ENCOUNTER — Ambulatory Visit: Payer: 59 | Admitting: Physical Therapy

## 2012-10-09 ENCOUNTER — Encounter: Payer: Self-pay | Admitting: Sports Medicine

## 2012-10-09 ENCOUNTER — Ambulatory Visit (INDEPENDENT_AMBULATORY_CARE_PROVIDER_SITE_OTHER): Payer: 59 | Admitting: Sports Medicine

## 2012-10-09 VITALS — BP 113/71 | HR 83 | Resp 14 | Wt 164.0 lb

## 2012-10-09 DIAGNOSIS — M25519 Pain in unspecified shoulder: Secondary | ICD-10-CM

## 2012-10-09 DIAGNOSIS — M25511 Pain in right shoulder: Secondary | ICD-10-CM

## 2012-10-09 DIAGNOSIS — M5412 Radiculopathy, cervical region: Secondary | ICD-10-CM

## 2012-10-09 NOTE — Assessment & Plan Note (Signed)
Resolved with physical therapy. 

## 2012-10-09 NOTE — Progress Notes (Signed)
   Subjective:    CC: Follow up  HPI: Neck pain: Resolved after physical therapy.  Right shoulder pain: Resembled subacromial bursitis, has not really done much therapy, still hurts with reaching overhead, with internal rotation of the shoulder. Mobic helps a little but, still wakes her from sleep occasionally.  Past medical history, Surgical history, Family history not pertinant except as noted below, Social history, Allergies, and medications have been entered into the medical record, reviewed, and no changes needed.   Review of Systems: No headache, visual changes, nausea, vomiting, diarrhea, constipation, dizziness, abdominal pain, skin rash, fevers, chills, night sweats, weight loss, swollen lymph nodes, body aches, joint swelling, muscle aches, chest pain, shortness of breath, mood changes, visual or auditory hallucinations.   Objective:   General: Well Developed, well nourished, and in no acute distress.  Neuro/Psych: Alert and oriented x3, extra-ocular muscles intact, able to move all 4 extremities, sensation grossly intact. Skin: Warm and dry, no rashes noted.  Respiratory: Not using accessory muscles, speaking in full sentences, trachea midline.  Cardiovascular: Pulses palpable, no extremity edema. Abdomen: Does not appear distended. Right Shoulder: Inspection reveals no abnormalities, atrophy or asymmetry. Palpation is normal with no tenderness over AC joint or bicipital groove. ROM is full in all planes. Rotator cuff strength normal throughout. Positive Neer and Hawkin's tests, empty can sign. Speeds and Yergason's tests normal. No labral pathology noted with negative Obrien's, negative clunk and good stability. Normal scapular function observed. No painful arc and no drop arm sign. No apprehension sign Impression and Recommendations:   This case required medical decision making of moderate complexity.

## 2012-10-09 NOTE — Assessment & Plan Note (Signed)
Clinically represents a subacromial bursitis. Home exercises and third band given. Return in 4 weeks, injection if no better.

## 2012-10-12 ENCOUNTER — Encounter: Payer: Self-pay | Admitting: Family Medicine

## 2012-10-12 ENCOUNTER — Ambulatory Visit (INDEPENDENT_AMBULATORY_CARE_PROVIDER_SITE_OTHER): Payer: 59 | Admitting: Family Medicine

## 2012-10-12 VITALS — BP 110/70 | HR 106 | Temp 98.2°F | Wt 164.0 lb

## 2012-10-12 DIAGNOSIS — J019 Acute sinusitis, unspecified: Secondary | ICD-10-CM

## 2012-10-12 MED ORDER — AZITHROMYCIN 250 MG PO TABS: ORAL_TABLET | ORAL | Status: DC

## 2012-10-12 MED ORDER — METHYLPREDNISOLONE ACETATE 80 MG/ML IJ SUSP
120.0000 mg | Freq: Once | INTRAMUSCULAR | Status: AC
  Administered 2012-10-12: 120 mg via INTRAMUSCULAR

## 2012-10-12 NOTE — Progress Notes (Signed)
  Subjective:    Patient ID: REMEDY CORPORAN, female    DOB: 12-03-1975, 37 y.o.   MRN: [id number]  HPI Here for one week of sinus pressure, HA, PND, ear pain, ST , and a dry cough. No fever.    Review of Systems  Constitutional: Negative.   HENT: Positive for ear pain, congestion, postnasal drip and sinus pressure.   Eyes: Negative.   Respiratory: Positive for cough.        Objective:   Physical Exam  Constitutional: She appears well-developed and well-nourished.  HENT:  Right Ear: External ear normal.  Left Ear: External ear normal.  Nose: Nose normal.  Mouth/Throat: Oropharynx is clear and moist.  Eyes: Conjunctivae are normal.  Neck: No thyromegaly present.  Pulmonary/Chest: Effort normal and breath sounds normal.  Lymphadenopathy:    She has no cervical adenopathy.          Assessment & Plan:  Out of work today

## 2012-10-12 NOTE — Addendum Note (Signed)
Addended by: Aniceto Boss A on: 10/12/2012 05:04 PM   Modules accepted: Orders

## 2012-10-23 ENCOUNTER — Telehealth: Payer: Self-pay | Admitting: Family Medicine

## 2012-10-23 MED ORDER — FLUCONAZOLE 150 MG PO TABS: 150.0000 mg | ORAL_TABLET | Freq: Once | ORAL | Status: DC

## 2012-10-23 NOTE — Telephone Encounter (Signed)
Refill request for Fluconazole 150 mg, per Dr. Clent Ridges okay to refill for 1 and 11 refills, which I did send to pharmacy [facility].

## 2012-10-26 ENCOUNTER — Other Ambulatory Visit: Payer: Self-pay | Admitting: Family Medicine

## 2012-10-26 NOTE — Telephone Encounter (Signed)
Can we refill this? 

## 2012-11-06 ENCOUNTER — Ambulatory Visit (INDEPENDENT_AMBULATORY_CARE_PROVIDER_SITE_OTHER): Payer: 59 | Admitting: Sports Medicine

## 2012-11-06 ENCOUNTER — Encounter: Payer: Self-pay | Admitting: Sports Medicine

## 2012-11-06 VITALS — BP 119/76 | HR 91

## 2012-11-06 DIAGNOSIS — M25511 Pain in right shoulder: Secondary | ICD-10-CM

## 2012-11-06 DIAGNOSIS — M25519 Pain in unspecified shoulder: Secondary | ICD-10-CM

## 2012-11-06 NOTE — Assessment & Plan Note (Signed)
Subacromial injection as above. Continue NSAIDs. Get more aggressive with home exercises. Return in 4 weeks. May consider a repeat injection versus MRI if no better.

## 2012-11-06 NOTE — Progress Notes (Signed)
  Subjective:    CC: Followup  HPI: Right shoulder pain: Clinically resembles a subacromial bursitis. Unfortunately has not been doing the exercises as frequently as she should be. Still has pain but she localizes over the deltoid, worse with overhead activities, no radiation. Pain is moderate.  Neck pain: With radiculitis, improved significantly after formal physical therapy for her neck. She is getting some recurrence.   Past medical history, Surgical history, Family history not pertinant except as noted below, Social history, Allergies, and medications have been entered into the medical record, reviewed, and no changes needed.   Review of Systems: No fevers, chills, night sweats, weight loss, chest pain, or shortness of breath.   Objective:    General: Well Developed, well nourished, and in no acute distress.  Neuro: Alert and oriented x3, extra-ocular muscles intact, sensation grossly intact.  HEENT: Normocephalic, atraumatic, pupils equal round reactive to light, neck supple, no masses, no lymphadenopathy, thyroid nonpalpable.  Skin: Warm and dry, no rashes. Cardiac: Regular rate and rhythm, no murmurs rubs or gallops, no lower extremity edema.  Respiratory: Clear to auscultation bilaterally. Not using accessory muscles, speaking in full sentences. Right Shoulder: Inspection reveals no abnormalities, atrophy or asymmetry. Palpation is normal with no tenderness over AC joint or bicipital groove. ROM is full in all planes. Rotator cuff strength normal throughout. Positive Neer and Hawkin's tests, empty can sign. Speeds and Yergason's tests normal. No labral pathology noted with negative Obrien's, negative clunk and good stability. Normal scapular function observed. No painful arc and no drop arm sign. No apprehension sign  Procedure: Real-time Ultrasound Guided Injection of right subacromial bursa Device: GE Logiq E  Ultrasound guided injection is preferred based studies that  show increased duration, increased effect, greater accuracy, decreased procedural pain, increased response rate, and decreased cost with ultrasound guided versus blind injection.  Verbal informed consent obtained.  Time-out conducted.  Noted no overlying erythema, induration, or other signs of local infection.  Skin prepped in a sterile fashion.  Local anesthesia: Topical Ethyl chloride.  With sterile technique and under real time ultrasound guidance:  25-gauge needle advanced into subacromial bursa, 1 cc Kenalog 40, 4 cc lidocaine injected easily. Rotator cuff specifically supraspinatus appeared intact. Completed without difficulty  Pain immediately resolved suggesting accurate placement of the medication.  Advised to call if fevers/chills, erythema, induration, drainage, or persistent bleeding.  Images permanently stored and available for review in the ultrasound unit.  Impression: Technically successful ultrasound guided injection. Impression and Recommendations:

## 2012-12-07 ENCOUNTER — Encounter: Payer: Self-pay | Admitting: Sports Medicine

## 2012-12-07 ENCOUNTER — Ambulatory Visit (INDEPENDENT_AMBULATORY_CARE_PROVIDER_SITE_OTHER): Payer: 59 | Admitting: Sports Medicine

## 2012-12-07 VITALS — BP 111/64 | HR 75 | Wt 161.0 lb

## 2012-12-07 DIAGNOSIS — M25511 Pain in right shoulder: Secondary | ICD-10-CM

## 2012-12-07 DIAGNOSIS — IMO0002 Reserved for concepts with insufficient information to code with codable children: Secondary | ICD-10-CM

## 2012-12-07 DIAGNOSIS — M7050 Other bursitis of knee, unspecified knee: Secondary | ICD-10-CM | POA: Insufficient documentation

## 2012-12-07 NOTE — Progress Notes (Signed)
  Subjective:    CC: Followup  HPI: Right-sided rotator cuff dysfunction: Pain completely resolved after ultrasound-guided subacromial bursa injection, she continues her exercises. She did get a small area of hypopigmentation.  Right knee pain: Pain is localized just distal to the medial joint line. Worse with ambulation, worse with palpation, pain is localized on the doesn't radiate, moderate.  Past medical history, Surgical history, Family history not pertinant except as noted below, Social history, Allergies, and medications have been entered into the medical record, reviewed, and no changes needed.   Review of Systems: No fevers, chills, night sweats, weight loss, chest pain, or shortness of breath.   Objective:    General: Well Developed, well nourished, and in no acute distress.  Neuro: Alert and oriented x3, extra-ocular muscles intact, sensation grossly intact.  HEENT: Normocephalic, atraumatic, pupils equal round reactive to light, neck supple, no masses, no lymphadenopathy, thyroid nonpalpable.  Skin: Warm and dry, no rashes. Cardiac: Regular rate and rhythm, no murmurs rubs or gallops, no lower extremity edema.  Respiratory: Clear to auscultation bilaterally. Not using accessory muscles, speaking in full sentences. Right Shoulder: Inspection reveals no abnormalities, atrophy or asymmetry. Palpation is normal with no tenderness over AC joint or bicipital groove. ROM is full in all planes. Rotator cuff strength normal throughout. No signs of impingement with negative Neer and Hawkin's tests, empty can sign. Speeds and Yergason's tests normal. No labral pathology noted with negative Obrien's, negative clunk and good stability. Normal scapular function observed. No painful arc and no drop arm sign. No apprehension sign  Right Knee: Normal to inspection with no erythema or effusion or obvious bony abnormalities. Palpation normal with no warmth, joint line tenderness, patellar  tenderness, or condyle tenderness. ROM full in flexion and extension and lower leg rotation. Ligaments with solid consistent endpoints including ACL, PCL, LCL, MCL. Negative Mcmurray's, Apley's, and Thessalonian tests. Non painful patellar compression. Patellar glide without crepitus. Patellar and quadriceps tendons unremarkable. Hamstring and quadriceps strength is normal.  Tender to palpation at the pes anserine bursa. Impression and Recommendations:

## 2012-12-07 NOTE — Assessment & Plan Note (Signed)
Pain resolved after injection. There is small amount of hypopigmentation that will resolve. Continue exercises.

## 2012-12-07 NOTE — Assessment & Plan Note (Signed)
Home exercises. Mobic. Custom orthotics. Return in 2-3 weeks.

## 2013-01-08 ENCOUNTER — Encounter: Payer: 59 | Admitting: Sports Medicine

## 2013-04-15 ENCOUNTER — Other Ambulatory Visit: Payer: Self-pay

## 2013-04-22 ENCOUNTER — Encounter: Payer: Self-pay | Admitting: Family Medicine

## 2013-04-22 ENCOUNTER — Ambulatory Visit (INDEPENDENT_AMBULATORY_CARE_PROVIDER_SITE_OTHER): Payer: 59 | Admitting: Family Medicine

## 2013-04-22 ENCOUNTER — Telehealth: Payer: Self-pay | Admitting: Family Medicine

## 2013-04-22 VITALS — BP 100/70 | HR 78 | Temp 98.0°F | Wt 158.0 lb

## 2013-04-22 DIAGNOSIS — IMO0002 Reserved for concepts with insufficient information to code with codable children: Secondary | ICD-10-CM

## 2013-04-22 DIAGNOSIS — J069 Acute upper respiratory infection, unspecified: Secondary | ICD-10-CM

## 2013-04-22 DIAGNOSIS — S0300XS Dislocation of jaw, unspecified side, sequela: Secondary | ICD-10-CM

## 2013-04-22 NOTE — Progress Notes (Signed)
Pre visit review using our clinic review tool, if applicable. No additional management support is needed unless otherwise documented below in the visit note. 

## 2013-04-22 NOTE — Telephone Encounter (Signed)
Patient Information:  Caller Name: Yolande  Phone: [phone]  Patient: Laura [name], Laura [name]  Gender: Female  DOB: 1976/02/01  Age: 37 Years  PCP: Gershon Crane [facility])  Pregnant: No  Office Follow Up:  Does the office need to follow up with this patient?: No  Instructions For The Office: N/A   Symptoms  Reason For Call & Symptoms: Reports feeling ill in general. More fatigued than normal. Also having pain in TMJ joints at times.  Reviewed Health History In EMR: Yes  Reviewed Medications In EMR: Yes  Reviewed Allergies In EMR: Yes  Reviewed Surgeries / Procedures: Yes  Date of Onset of Symptoms: 04/19/2013  Treatments Tried: Tylenol, Ibuprofen, Mucinex  Treatments Tried Worked: Yes OB / GYN:  LMP: Unknown  Guideline(s) Used:  Weakness (Generalized) and Fatigue  Disposition Per Guideline:   Go to Office Now  Reason For Disposition Reached:   Moderate weakness (i.e., interferes with work, school, normal activities) and cause unknown  Advice Given:  N/A  Patient Will Follow Care Advice:  YES  Appointment Scheduled:  04/22/2013 16:00:00 Appointment Scheduled Provider:  Selena Batten (TEXT 1st, after 20 mins can call), [facility]ia Client [facility])

## 2013-04-22 NOTE — Progress Notes (Signed)
Chief Complaint  Patient presents with  . Jaw Pain  . Sore Throat    HPI:  Acute visit for:  1)"bronchitis" or ? "sinusitis" -started about 1 week ago -nasal congestion, R ear and jaw pain, drainage in throat, PND, cough, sinus pain on R -denies: fever, NVD, no flu or ebola exposure or risks or travel, SOB, wheezing  2)Jaw pain -hx of tmj -Pain in R TMJ joint a bit worse lately, no locking   ROS: See pertinent positives and negatives per HPI.  Past Medical History  Diagnosis Date  . IBS (irritable bowel syndrome)   . Depression   . GERD (gastroesophageal reflux disease)   . Restless legs   . Insomnia   . Headache(784.0)   . UTI (lower urinary tract infection)     sees Dr Vernie Ammons  . Anemia   . Anxiety   . Pituitary microadenoma with hyperprolactinemia     sees Dr. Jimmye Norman in [facility]    Past Surgical History  Procedure Laterality Date  . Tonsillectomy    . Abdominal hysterectomy  Nov 2008  . Neck surgery  JULY 1996    L side lymph node removal  . Wisdom tooth extraction    . Tubal ligation      Family History  Problem Relation Age of Onset  . Alcohol abuse Father   . Coronary artery disease Maternal Grandmother     paternal grandmother  . Diabetes Maternal Grandfather   . Hypertension Mother     father, MGF, MGM, PGF, PGM  . Thyroid disease Mother     maternal grandmother  . Colon cancer Neg Hx   . Esophageal cancer Neg Hx   . Stomach cancer Neg Hx   . Aneurysm Paternal Grandfather     aortic    History   Social History  . Marital Status: Single    Spouse Name: N/A    Number of Children: 2  . Years of Education: N/A   Occupational History  . accounting    Social History Main Topics  . Smoking status: Never Smoker   . Smokeless tobacco: Never Used  . Alcohol Use: No  . Drug Use: No  . Sexual Activity: None   Other Topics Concern  . None   Social History Narrative  . None    Current outpatient prescriptions:buPROPion  (WELLBUTRIN XL) 300 MG 24 hr tablet, TAKE 1 TABLET BY MOUTH IN THE MORNING, Disp: 30 tablet, Rfl: 11;  cabergoline (DOSTINEX) 0.5 MG tablet, Take 0.25 mg by mouth 2 (two) times a week., Disp: , Rfl: ;  cholecalciferol (VITAMIN D) 1000 UNITS tablet, Take 1,000 Units by mouth daily. Take 2 , Disp: , Rfl: ;  doxycycline (VIBRAMYCIN) 100 MG capsule, Take 100 mg by mouth 2 (two) times daily. , Disp: , Rfl:  glycopyrrolate (ROBINUL-FORTE) 2 MG tablet, Take 1 tablet (2 mg total) by mouth 2 (two) times daily., Disp: 60 tablet, Rfl: 1;  ibuprofen (ADVIL,MOTRIN) 800 MG tablet, Take 800 mg by mouth every 8 (eight) hours as needed.  , Disp: , Rfl: ;  meloxicam (MOBIC) 15 MG tablet, One tab PO qAM with breakfast for 2 weeks, then daily prn pain., Disp: 30 tablet, Rfl: 3;  omeprazole (PRILOSEC) 40 MG capsule, EVERY DAY, Disp: 30 capsule, Rfl: 11 cyclobenzaprine (FLEXERIL) 10 MG tablet, One half tab PO qHS, then increase gradually to one tab TID., Disp: 30 tablet, Rfl: 0;  Vitamin D, Ergocalciferol, (DRISDOL) 50000 UNITS CAPS, TAKE 1 CAPSULE (50,000 UNITS TOTAL)  BY MOUTH EVERY 7 (SEVEN) DAYS., Disp: 4 capsule, Rfl: 2  EXAM:  Filed Vitals:   04/22/13 1605  BP: 100/70  Pulse: 78  Temp: 98 F (36.7 C)    Body mass index is 24.03 kg/(m^2).  GENERAL: vitals reviewed and listed above, alert, oriented, appears well hydrated and in no acute distress  HEENT: atraumatic, conjunttiva clear, no obvious abnormalities on inspection of external nose and ears, normal appearance of ear canals and TMs, clear nasal congestion, mild post oropharyngeal erythema with PND, no tonsillar edema or exudate, no sinus TTP TMJ clicking with opening of jaw and pain in this area  NECK: no obvious masses on inspection  LUNGS: clear to auscultation bilaterally, no wheezes, rales or rhonchi, good air movement  CV: HRRR, no peripheral edema  MS: moves all extremities without noticeable abnormality  PSYCH: pleasant and cooperative, no  obvious depression or anxiety  ASSESSMENT AND PLAN:  Discussed the following assessment and plan:  Acute upper respiratory infections of unspecified site  TMJ (dislocation of temporomandibular joint), sequela  -given HPI and exam findings today, a serious infection or illness is unlikely. We discussed potential etiologies, with VURI being most likely, and advised supportive care and monitoring. We discussed treatment side effects, likely course, antibiotic misuse, transmission, and signs of developing a serious illness. -of course, we advised to return or notify a doctor immediately if symptoms worsen or persist or new concerns arise. -in regards to her TMJ adivsed exercises and ibuprofen or tylenol and to contact PCP if not improving as may need to get referral to specialist -Patient advised to return or notify a doctor immediately if symptoms worsen or persist or new concerns arise.  Patient Instructions  INSTRUCTIONS FOR UPPER RESPIRATORY INFECTION:  -plenty of rest and fluids  -nasal saline wash 2-3 times daily (use prepackaged nasal saline or bottled/distilled water if making your own)   -clean nose with nasal saline before using the nasal steroid or sinex  -can use sinex nasal spray for drainage and nasal congestion - but do NOT use longer then 3-4 days  -can use tylenol or ibuprofen as directed for aches and sorethroat  -in the winter time, using a humidifier at night is helpful (please follow cleaning instructions)  -if you are taking a cough medication - use only as directed, may also try a teaspoon of honey to coat the throat and throat lozenges  -for sore throat, salt water gargles can help  -follow up if you have fevers, facial pain, tooth pain, difficulty breathing or are worsening or not getting better in 5-7 days      Laura [name] R.

## 2013-04-22 NOTE — Patient Instructions (Signed)

## 2013-07-18 ENCOUNTER — Other Ambulatory Visit: Payer: Self-pay | Admitting: Family Medicine

## 2013-07-19 ENCOUNTER — Telehealth: Payer: Self-pay | Admitting: Sports Medicine

## 2013-07-19 MED ORDER — LUBIPROSTONE 24 MCG PO CAPS: 24.0000 ug | ORAL_CAPSULE | Freq: Two times a day (BID) | ORAL | Status: DC

## 2013-07-19 NOTE — Telephone Encounter (Signed)
Prescription called in, we do have some discount cards that she should come pick up. Any further refills need to be done through her primary care provider.

## 2013-07-19 NOTE — Telephone Encounter (Signed)
Pt called. She  wants  the rx for  Amitiza because it  is working well for her(you told he if it works for her to give you a call..was here with her daughter)

## 2013-09-16 ENCOUNTER — Other Ambulatory Visit: Payer: Self-pay | Admitting: Sports Medicine

## 2013-09-22 ENCOUNTER — Other Ambulatory Visit: Payer: Self-pay | Admitting: Family Medicine

## 2013-09-22 DIAGNOSIS — E559 Vitamin D deficiency, unspecified: Secondary | ICD-10-CM

## 2013-09-24 NOTE — Telephone Encounter (Signed)
I spoke with pt and she is going to schedule the lab draw first.

## 2013-09-24 NOTE — Telephone Encounter (Signed)
Does pt need a level drawn or refill now?

## 2013-09-24 NOTE — Telephone Encounter (Signed)
Lets check a level first , I put in the order

## 2013-09-28 ENCOUNTER — Other Ambulatory Visit: Payer: 59

## 2013-09-29 ENCOUNTER — Other Ambulatory Visit: Payer: 59

## 2013-09-29 DIAGNOSIS — E559 Vitamin D deficiency, unspecified: Secondary | ICD-10-CM

## 2013-09-30 LAB — VITAMIN D 25 HYDROXY (VIT D DEFICIENCY, FRACTURES): VIT D 25 HYDROXY: 34 ng/mL (ref 30–89)

## 2013-10-19 ENCOUNTER — Ambulatory Visit: Payer: 59 | Admitting: Sports Medicine

## 2013-12-15 ENCOUNTER — Telehealth: Payer: Self-pay | Admitting: Family Medicine

## 2013-12-15 NOTE — Telephone Encounter (Signed)
Pt called to ask what her vitamin D level was at her last lab visit  Please call

## 2013-12-16 NOTE — Telephone Encounter (Signed)
I left a voice with results and also they were released in my chart.

## 2014-01-04 ENCOUNTER — Encounter: Payer: Self-pay | Admitting: Family Medicine

## 2014-01-04 ENCOUNTER — Ambulatory Visit (INDEPENDENT_AMBULATORY_CARE_PROVIDER_SITE_OTHER): Payer: Managed Care, Other (non HMO) | Admitting: Family Medicine

## 2014-01-04 VITALS — BP 103/66 | HR 73 | Temp 99.0°F | Ht 68.0 in | Wt 170.0 lb

## 2014-01-04 DIAGNOSIS — R5383 Other fatigue: Principal | ICD-10-CM

## 2014-01-04 DIAGNOSIS — G4719 Other hypersomnia: Secondary | ICD-10-CM

## 2014-01-04 DIAGNOSIS — G4710 Hypersomnia, unspecified: Secondary | ICD-10-CM

## 2014-01-04 DIAGNOSIS — R5381 Other malaise: Secondary | ICD-10-CM

## 2014-01-04 LAB — VITAMIN D 25 HYDROXY (VIT D DEFICIENCY, FRACTURES): VITD: 43.07 ng/mL (ref 30.00–100.00)

## 2014-01-04 LAB — POCT URINALYSIS DIPSTICK
BILIRUBIN UA: NEGATIVE
GLUCOSE UA: NEGATIVE
Ketones, UA: NEGATIVE
Leukocytes, UA: NEGATIVE
NITRITE UA: NEGATIVE
Protein, UA: NEGATIVE
RBC UA: NEGATIVE
Spec Grav, UA: 1.01
Urobilinogen, UA: 0.2
pH, UA: 7

## 2014-01-04 LAB — VITAMIN B12: Vitamin B-12: 236 pg/mL (ref 211–911)

## 2014-01-04 LAB — HEPATIC FUNCTION PANEL
ALBUMIN: 4.2 g/dL (ref 3.5–5.2)
ALT: 14 U/L (ref 0–35)
AST: 13 U/L (ref 0–37)
Alkaline Phosphatase: 59 U/L (ref 39–117)
Bilirubin, Direct: 0.1 mg/dL (ref 0.0–0.3)
Total Bilirubin: 0.6 mg/dL (ref 0.2–1.2)
Total Protein: 6.9 g/dL (ref 6.0–8.3)

## 2014-01-04 LAB — CBC WITH DIFFERENTIAL/PLATELET
BASOS PCT: 0.5 % (ref 0.0–3.0)
Basophils Absolute: 0 10*3/uL (ref 0.0–0.1)
Eosinophils Absolute: 0 10*3/uL (ref 0.0–0.7)
Eosinophils Relative: 1 % (ref 0.0–5.0)
HCT: 39.2 % (ref 36.0–46.0)
Hemoglobin: 13 g/dL (ref 12.0–15.0)
Lymphocytes Relative: 31 % (ref 12.0–46.0)
Lymphs Abs: 1.5 10*3/uL (ref 0.7–4.0)
MCHC: 33.2 g/dL (ref 30.0–36.0)
MCV: 93.8 fl (ref 78.0–100.0)
MONOS PCT: 7.5 % (ref 3.0–12.0)
Monocytes Absolute: 0.4 10*3/uL (ref 0.1–1.0)
Neutro Abs: 2.9 10*3/uL (ref 1.4–7.7)
Neutrophils Relative %: 60 % (ref 43.0–77.0)
Platelets: 198 10*3/uL (ref 150.0–400.0)
RBC: 4.18 Mil/uL (ref 3.87–5.11)
RDW: 13.2 % (ref 11.5–15.5)
WBC: 4.9 10*3/uL (ref 4.0–10.5)

## 2014-01-04 LAB — BASIC METABOLIC PANEL
BUN: 16 mg/dL (ref 6–23)
CALCIUM: 9.2 mg/dL (ref 8.4–10.5)
CO2: 28 mEq/L (ref 19–32)
Chloride: 107 mEq/L (ref 96–112)
Creatinine, Ser: 0.9 mg/dL (ref 0.4–1.2)
GFR: 73.28 mL/min (ref >60.00)
GLUCOSE: 81 mg/dL (ref 70–99)
Potassium: 4.5 mEq/L (ref 3.5–5.1)
Sodium: 138 mEq/L (ref 135–145)

## 2014-01-04 LAB — TSH: TSH: 2.69 u[IU]/mL (ref 0.35–4.50)

## 2014-01-04 NOTE — Progress Notes (Signed)
   Subjective:    Patient ID: Laura [name], female    DOB: [birth year], 38 y.o.   MRN: [id number]  HPI Here complaining about constant fatigue and daytime sleepiness for the past few months. She has a hx of restless legs but this has not been treated for some time. She is not aware of waking up during the night. Her husband has not mentioned her kicking or snoring during the night. During the day she finds herself fighting to stay awake at work, in the car, ar home, really anywhere.    Review of Systems  Constitutional: Positive for fatigue.  Respiratory: Negative.   Cardiovascular: Negative.   Endocrine: Negative.   Neurological: Negative.        Objective:   Physical Exam  Constitutional: She is oriented to person, place, and time. She appears well-developed and well-nourished.  Neck: No thyromegaly present.  Cardiovascular: Normal rate, regular rhythm, normal heart sounds and intact distal pulses.   Pulmonary/Chest: Effort normal and breath sounds normal.  Lymphadenopathy:    She has no cervical adenopathy.  Neurological: She is alert and oriented to person, place, and time.          Assessment & Plan:  It sounds like she is having some sort of sleep issue, likely restless legs. Screen labs today. Refer for a sleep study.

## 2014-01-04 NOTE — Progress Notes (Signed)
Pre visit review using our clinic review tool, if applicable. No additional management support is needed unless otherwise documented below in the visit note. 

## 2014-01-11 ENCOUNTER — Ambulatory Visit (INDEPENDENT_AMBULATORY_CARE_PROVIDER_SITE_OTHER): Payer: Managed Care, Other (non HMO) | Admitting: Pulmonary Disease

## 2014-01-11 ENCOUNTER — Encounter: Payer: Self-pay | Admitting: Pulmonary Disease

## 2014-01-11 VITALS — BP 110/80 | HR 62 | Temp 98.0°F | Ht 68.5 in | Wt 168.6 lb

## 2014-01-11 DIAGNOSIS — G4710 Hypersomnia, unspecified: Secondary | ICD-10-CM

## 2014-01-11 NOTE — Progress Notes (Signed)
Subjective:    Patient ID: Laura [name], female    DOB: 1975/07/06, 38 y.o.   MRN: [id number]  HPI The patient is a 38 year old female who I've been asked to see for daytime hypersomnia. The patient states that she has had issues with daytime sleepiness for at least 3 years, and has probably been going on even longer. She has a history of pituitary microadenoma with hyperprolactinemia, but has recently seen her endocrinologist where an MRI was stable and all of her hormonal levels were adequate by her history. The patient has no issues going to sleep, but her husband states that she is a restless sleeper. She has intermittent snoring, but no abnormal breathing pattern. She does not feel rested in the mornings upon arising. Her husband has not felt her kick during the night, but the patient does have some symptoms suggestive of to restless leg syndrome.  It takes her quite a while to feel that she has completely awakened, and she notes definite sleep pressure during the day with inactivity while at work. She also is unable to stay awake watching television or movies in the evening, but denies any sleepiness with driving. The patient's weight has increased 10-15 pounds over the last 2 years, and her Epworth score is very abnormal at 12. She denies any symptoms consistent with sleep paralysis, hypnagogic hallucinations, or cataplexy.   Sleep Questionnaire What time do you typically go to bed?( Between what hours) 1030-1130pm 1030-1130pm at 1436 on 01/11/14 by Lilli Few, CMA How long does it take you to fall asleep? 5 minutes 5 minutes at 1436 on 01/11/14 by Lilli Few, CMA How many times during the night do you wake up? 2 2 at 1436 on 01/11/14 by Lilli Few, CMA What time do you get out of bed to start your day? [id number] at 1436 on 01/11/14 by Lilli Few, CMA Do you drive or operate heavy machinery in your occupation? No No at 1436 on 01/11/14 by  Lilli Few, CMA How much has your weight changed (up or down) over the past two years? (In pounds) 10 lb (4.536 kg) 10 lb (4.536 kg) at 1436 on 01/11/14 by Lilli Few, CMA Have you ever had a sleep study before? No No at 1436 on 01/11/14 by Lilli Few, CMA Do you currently use CPAP? No No at 1436 on 01/11/14 by Lilli Few, CMA Do you wear oxygen at any time? No No at 1436 on 01/11/14 by Lilli Few, CMA   Review of Systems  Constitutional: Negative for fever and unexpected weight change.  HENT: Negative for congestion, dental problem, ear pain, nosebleeds, postnasal drip, rhinorrhea, sinus pressure, sneezing, sore throat and trouble swallowing.   Eyes: Negative for redness and itching.  Respiratory: Negative for cough, chest tightness, shortness of breath and wheezing.   Cardiovascular: Negative for palpitations and leg swelling.  Gastrointestinal: Positive for abdominal pain. Negative for nausea and vomiting.  Genitourinary: Negative for dysuria.  Musculoskeletal: Negative for joint swelling.  Skin: Negative for rash.  Neurological: Positive for headaches.  Hematological: Does not bruise/bleed easily.  Psychiatric/Behavioral: Negative for dysphoric mood. The patient is nervous/anxious.        Objective:   Physical Exam Constitutional:  Overweight female, no acute distress  HENT:  Nares patent without discharge  Oropharynx without exudate, palate and uvula are normal  Eyes:  Perrla, eomi, no scleral icterus  Neck:  No JVD, no TMG  Cardiovascular:  Normal rate, regular rhythm, no rubs or gallops.  No murmurs        Intact distal pulses  Pulmonary :  Normal breath sounds, no stridor or respiratory distress   No rales, rhonchi, or wheezing  Abdominal:  Soft, nondistended, bowel sounds present.  No tenderness noted.   Musculoskeletal:  No lower extremity edema noted.  Lymph Nodes:  No cervical lymphadenopathy noted  Skin:   No cyanosis noted  Neurologic:  Alert, appropriate, moves all 4 extremities without obvious deficit.         Assessment & Plan:

## 2014-01-11 NOTE — Patient Instructions (Signed)
Think about your bedroom environment, and whether it may be contributing to your nonrestorative sleep. If you wish to evaluate your sleep issues and daytime sleepiness, will need to do a nighttime sleep study followed by a daytime night study as we discussed.  Please call if you wish to do this.

## 2014-01-11 NOTE — Assessment & Plan Note (Signed)
The patient clearly has inappropriate daytime hypersomnia, and it is unclear from her history what may be causing this. Her history is not overly convincing for sleep disordered breathing, but her husband states that she is a restless sleeper. He does not feel that she kicks during the night, but the patient does describe some symptoms consistent with restless leg syndrome. She denies any of the classic symptoms of narcolepsy, but does have sleepiness that has a significant impact to her job and quality of life. She has a history of a pituitary microadenoma, but has had this imaged in may and all of her levels are adequate after seeing her endocrinologist.  At this point, if she wishes to pursue an evaluation of her symptoms, she will need a sleep study followed by a multiple sleep latency test. I have also asked her to make a critical assessment of her sleeping environment with respect to sound, light, mattress/pillow, and pets.  The patient and her husband would like to think about all of this, and will get back with [place] if they wish to pursue an evaluation.

## 2014-01-22 ENCOUNTER — Other Ambulatory Visit: Payer: Self-pay | Admitting: Family Medicine

## 2014-01-24 NOTE — Telephone Encounter (Signed)
Should pt continue with this medication?

## 2014-02-17 ENCOUNTER — Encounter: Payer: Self-pay | Admitting: Sports Medicine

## 2014-02-17 ENCOUNTER — Ambulatory Visit (INDEPENDENT_AMBULATORY_CARE_PROVIDER_SITE_OTHER): Payer: Managed Care, Other (non HMO) | Admitting: Sports Medicine

## 2014-02-17 ENCOUNTER — Ambulatory Visit (INDEPENDENT_AMBULATORY_CARE_PROVIDER_SITE_OTHER): Payer: Managed Care, Other (non HMO)

## 2014-02-17 VITALS — BP 116/72 | HR 95 | Ht 68.0 in | Wt 172.0 lb

## 2014-02-17 DIAGNOSIS — M25519 Pain in unspecified shoulder: Secondary | ICD-10-CM

## 2014-02-17 DIAGNOSIS — M25512 Pain in left shoulder: Secondary | ICD-10-CM | POA: Insufficient documentation

## 2014-02-17 MED ORDER — TRAMADOL HCL 50 MG PO TABS: ORAL_TABLET | ORAL | Status: DC

## 2014-02-17 MED ORDER — DICLOFENAC SODIUM 75 MG PO TBEC: 75.0000 mg | DELAYED_RELEASE_TABLET | Freq: Two times a day (BID) | ORAL | Status: DC

## 2014-02-17 NOTE — Assessment & Plan Note (Signed)
Occurred in August of 2015 at work, clinically resembles biceps tendinitis. Diclofenac, tramadol, formal PT, x-rays. Return in one month, guided biceps tendon sheath injection if no better. I have asked her to file this through her workers comp.

## 2014-02-17 NOTE — Progress Notes (Signed)
  Subjective:    CC: Left shoulder pain  HPI: This is a very pleasant 38 year old female, I saw her several months ago for right-sided subacromial bursitis, this resolved after injection.  Left shoulder pain: Occurred in August after a fall at work, she fell backwards, skinned her wrist, hit her head, and the posterior aspect of her left shoulder, she initially filed an injury report but did not mention her left shoulder pain. Since then her pain has been localized to the anterior shoulder, without radiation, worse with reaching forward. Moderate, persistent, no numbness or tingling or neck pain.  Past medical history, Surgical history, Family history not pertinant except as noted below, Social history, Allergies, and medications have been entered into the medical record, reviewed, and no changes needed.   Review of Systems: No fevers, chills, night sweats, weight loss, chest pain, or shortness of breath.   Objective:    General: Well Developed, well nourished, and in no acute distress.  Neuro: Alert and oriented x3, extra-ocular muscles intact, sensation grossly intact.  HEENT: Normocephalic, atraumatic, pupils equal round reactive to light, neck supple, no masses, no lymphadenopathy, thyroid nonpalpable.  Skin: Warm and dry, no rashes. Cardiac: Regular rate and rhythm, no murmurs rubs or gallops, no lower extremity edema.  Respiratory: Clear to auscultation bilaterally. Not using accessory muscles, speaking in full sentences. Left Shoulder: Inspection reveals no abnormalities, atrophy or asymmetry. Tender to palpation in the bicipital groove. ROM is full in all planes. Rotator cuff strength normal throughout. No signs of impingement with negative Neer and Hawkin's tests, empty can. Positive speed test, negative Yergason test. No labral pathology noted with negative Obrien's, negative crank, negative clunk, and good stability. Normal scapular function observed. No painful arc and no  drop arm sign. No apprehension sign  X-rays are negative.  Impression and Recommendations:

## 2014-02-18 ENCOUNTER — Institutional Professional Consult (permissible substitution): Payer: Managed Care, Other (non HMO) | Admitting: Pulmonary Disease

## 2014-03-04 ENCOUNTER — Ambulatory Visit (INDEPENDENT_AMBULATORY_CARE_PROVIDER_SITE_OTHER): Payer: Managed Care, Other (non HMO) | Admitting: Physical Therapy

## 2014-03-04 DIAGNOSIS — R5381 Other malaise: Secondary | ICD-10-CM

## 2014-03-04 DIAGNOSIS — M25619 Stiffness of unspecified shoulder, not elsewhere classified: Secondary | ICD-10-CM

## 2014-03-04 DIAGNOSIS — M25519 Pain in unspecified shoulder: Secondary | ICD-10-CM

## 2014-03-07 ENCOUNTER — Encounter (INDEPENDENT_AMBULATORY_CARE_PROVIDER_SITE_OTHER): Payer: Managed Care, Other (non HMO)

## 2014-03-07 DIAGNOSIS — M25619 Stiffness of unspecified shoulder, not elsewhere classified: Secondary | ICD-10-CM

## 2014-03-07 DIAGNOSIS — M25519 Pain in unspecified shoulder: Secondary | ICD-10-CM

## 2014-03-07 DIAGNOSIS — M2550 Pain in unspecified joint: Secondary | ICD-10-CM

## 2014-03-07 DIAGNOSIS — R5381 Other malaise: Secondary | ICD-10-CM

## 2014-03-10 ENCOUNTER — Encounter: Payer: Managed Care, Other (non HMO) | Admitting: Physical Therapy

## 2014-03-10 ENCOUNTER — Encounter (INDEPENDENT_AMBULATORY_CARE_PROVIDER_SITE_OTHER): Payer: Managed Care, Other (non HMO) | Admitting: Physical Therapy

## 2014-03-10 DIAGNOSIS — M25519 Pain in unspecified shoulder: Secondary | ICD-10-CM

## 2014-03-10 DIAGNOSIS — R5381 Other malaise: Secondary | ICD-10-CM

## 2014-03-10 DIAGNOSIS — M25619 Stiffness of unspecified shoulder, not elsewhere classified: Secondary | ICD-10-CM

## 2014-03-14 ENCOUNTER — Encounter (INDEPENDENT_AMBULATORY_CARE_PROVIDER_SITE_OTHER): Payer: Managed Care, Other (non HMO) | Admitting: Physical Therapy

## 2014-03-14 DIAGNOSIS — M25579 Pain in unspecified ankle and joints of unspecified foot: Secondary | ICD-10-CM

## 2014-03-14 DIAGNOSIS — M25519 Pain in unspecified shoulder: Secondary | ICD-10-CM

## 2014-03-14 DIAGNOSIS — R5381 Other malaise: Secondary | ICD-10-CM

## 2014-03-14 DIAGNOSIS — M25619 Stiffness of unspecified shoulder, not elsewhere classified: Secondary | ICD-10-CM

## 2014-03-17 ENCOUNTER — Ambulatory Visit: Payer: Managed Care, Other (non HMO) | Admitting: Sports Medicine

## 2014-03-21 ENCOUNTER — Encounter: Payer: Managed Care, Other (non HMO) | Admitting: Physical Therapy

## 2014-03-21 ENCOUNTER — Ambulatory Visit: Payer: Managed Care, Other (non HMO) | Admitting: Sports Medicine

## 2014-03-24 ENCOUNTER — Encounter: Payer: Managed Care, Other (non HMO) | Admitting: Physical Therapy

## 2014-03-28 ENCOUNTER — Encounter: Payer: Managed Care, Other (non HMO) | Admitting: Physical Therapy

## 2014-03-31 ENCOUNTER — Encounter: Payer: Managed Care, Other (non HMO) | Admitting: Physical Therapy

## 2014-04-06 ENCOUNTER — Encounter: Payer: Self-pay | Admitting: Sports Medicine

## 2014-04-06 ENCOUNTER — Ambulatory Visit (INDEPENDENT_AMBULATORY_CARE_PROVIDER_SITE_OTHER): Payer: Managed Care, Other (non HMO) | Admitting: Sports Medicine

## 2014-04-06 VITALS — BP 118/76 | HR 77 | Wt 167.0 lb

## 2014-04-06 DIAGNOSIS — M25512 Pain in left shoulder: Secondary | ICD-10-CM

## 2014-04-06 DIAGNOSIS — K589 Irritable bowel syndrome without diarrhea: Secondary | ICD-10-CM

## 2014-04-06 DIAGNOSIS — J0100 Acute maxillary sinusitis, unspecified: Secondary | ICD-10-CM

## 2014-04-06 DIAGNOSIS — M722 Plantar fascial fibromatosis: Secondary | ICD-10-CM

## 2014-04-06 MED ORDER — AZITHROMYCIN 250 MG PO TABS: ORAL_TABLET | ORAL | Status: DC

## 2014-04-06 MED ORDER — LUBIPROSTONE 24 MCG PO CAPS: 24.0000 ug | ORAL_CAPSULE | Freq: Every day | ORAL | Status: DC

## 2014-04-06 NOTE — Assessment & Plan Note (Signed)
Amitiza refilled, discount coupon given. She does understand she needs to follow up with her PCP regarding this.

## 2014-04-06 NOTE — Assessment & Plan Note (Signed)
Azithromycin. Follow-up with PCP regarding this.

## 2014-04-06 NOTE — Progress Notes (Signed)
Subjective:    CC:  Follow-up  HPI: Left shoulder pain: moderate, persistent, has been through physical therapy, has never had injections. Persistent pain that she localizes over the deltoid and anterior jointline but worse with internal rotation. Moderate, persistent without radiation.  Foot pain: Left side, there is a nodule on the plantar aspect of her foot that exquisitely painful, no trauma.  Irritable bowel syndrome: Needs a refill on Amitiza. Understands that she will need further follow-up for this condition with her PCP.   Acute maxillary sinusitis: Pain and pressure over the sinuses, positive double sickening, moderate, persistent with purulent nasal discharge.  Past medical history, Surgical history, Family history not pertinant except as noted below, Social history, Allergies, and medications have been entered into the medical record, reviewed, and no changes needed.   Review of Systems: No fevers, chills, night sweats, weight loss, chest pain, or shortness of breath.   Objective:    General: Well Developed, well nourished, and in no acute distress.  Neuro: Alert and oriented x3, extra-ocular muscles intact, sensation grossly intact.  HEENT: Normocephalic, atraumatic, pupils equal round reactive to light, neck supple, no masses, no lymphadenopathy, thyroid nonpalpable.  Skin: Warm and dry, no rashes. Cardiac: Regular rate and rhythm, no murmurs rubs or gallops, no lower extremity edema.  Respiratory: Clear to auscultation bilaterally. Not using accessory muscles, speaking in full sentences. Left Shoulder: Inspection reveals no abnormalities, atrophy or asymmetry. Palpation is normal with no tenderness over AC joint or bicipital groove. ROM is full in all planes. Rotator cuff strength external rotation and internal rotation, positive lift off test. No signs of impingement with negative Neer and Hawkin's tests, empty can. Speeds and Yergason's tests normal. No labral  pathology noted with negative Obrien's, negative crank, negative clunk, and good stability. Normal scapular function observed. No painful arc and no drop arm sign. No apprehension sign leftFoot: No visible erythema or swelling. Range of motion is full in all directions. Strength is 5/5 in all directions. No hallux valgus. No pes cavus or pes planus. There is a palpable and tender nodule that feels to be in close approximation with the third flexor digitorum longus tendon sheath. No abnormal callus noted. No pain over the navicular prominence, or base of fifth metatarsal. No tenderness to palpation of the calcaneal insertion of plantar fascia. No pain at the Achilles insertion. No pain over the calcaneal bursa. No pain of the retrocalcaneal bursa. No tenderness to palpation over the tarsals, metatarsals, or phalanges. No hallux rigidus or limitus. No tenderness palpation over interphalangeal joints. No pain with compression of the metatarsal heads. Neurovascularly intact distally.  Procedure: Real-time Ultrasound Guided Injection of left subcoracoid bursa Device: GE Logiq E  Verbal informed consent obtained.  Time-out conducted.  Noted no overlying erythema, induration, or other signs of local infection.  Skin prepped in a sterile fashion.  Local anesthesia: Topical Ethyl chloride.  With sterile technique and under real time ultrasound guidance:  1 mL kenalog 40, 3 mL lidocaine injected easily Completed without difficulty  Pain immediately resolved suggesting accurate placement of the medication.  Advised to call if fevers/chills, erythema, induration, drainage, or persistent bleeding.  Images permanently stored and available for review in the ultrasound unit.  Impression: Technically successful ultrasound guided injection.  Procedure: Real-time Ultrasound Guided Injection of left third flexor digitorum longus tendon sheath Device: GE Logiq E  Verbal informed consent obtained.    Time-out conducted.  Noted no overlying erythema, induration, or other signs of local infection.  Skin prepped in a sterile fashion.  Local anesthesia: Topical Ethyl chloride.  With sterile technique and under real time ultrasound guidance:  1 mL kenalog 40, 2 mL lidocaine injected easily. Completed without difficulty  Pain immediately resolved suggesting accurate placement of the medication.  Advised to call if fevers/chills, erythema, induration, drainage, or persistent bleeding.  Images permanently stored and available for review in the ultrasound unit.  Impression: Technically successful ultrasound guided injection.  Impression and Recommendations:    I spent 40 minutes with this patient, greater than 50% was face-to-face time counseling regarding the above diagnoses

## 2014-04-06 NOTE — Assessment & Plan Note (Signed)
Guided injection as above. The nodule appeared to be in the third flexor digitorum longus tendon sheath on ultrasound.

## 2014-04-06 NOTE — Assessment & Plan Note (Signed)
Left subcoracoid injection as above. Return in one month.

## 2014-05-02 ENCOUNTER — Telehealth: Payer: Self-pay | Admitting: Family Medicine

## 2014-05-02 DIAGNOSIS — K589 Irritable bowel syndrome without diarrhea: Secondary | ICD-10-CM

## 2014-05-02 NOTE — Telephone Encounter (Signed)
Patient would like a re-fill on amitiza 24 mg, take 2 times per day.  It was prescribed by Dr. Silverio Decamp, originally.   [id number] - [place],  - [address]  She states taking the medication 2 times per day works better than 1 per day.

## 2014-05-03 MED ORDER — LUBIPROSTONE 24 MCG PO CAPS: 24.0000 ug | ORAL_CAPSULE | Freq: Two times a day (BID) | ORAL | Status: DC

## 2014-05-03 NOTE — Telephone Encounter (Signed)
Call in #[phone] with 11 rf 

## 2014-05-03 NOTE — Telephone Encounter (Signed)
I sent script e-scribe. 

## 2014-05-04 ENCOUNTER — Ambulatory Visit (INDEPENDENT_AMBULATORY_CARE_PROVIDER_SITE_OTHER): Payer: Managed Care, Other (non HMO)

## 2014-05-04 ENCOUNTER — Encounter: Payer: Self-pay | Admitting: Sports Medicine

## 2014-05-04 ENCOUNTER — Ambulatory Visit (INDEPENDENT_AMBULATORY_CARE_PROVIDER_SITE_OTHER): Payer: Managed Care, Other (non HMO) | Admitting: Sports Medicine

## 2014-05-04 VITALS — BP 115/64 | HR 89 | Ht 67.0 in | Wt 164.0 lb

## 2014-05-04 DIAGNOSIS — M5416 Radiculopathy, lumbar region: Secondary | ICD-10-CM

## 2014-05-04 DIAGNOSIS — G5622 Lesion of ulnar nerve, left upper limb: Secondary | ICD-10-CM

## 2014-05-04 DIAGNOSIS — M25512 Pain in left shoulder: Secondary | ICD-10-CM

## 2014-05-04 DIAGNOSIS — M722 Plantar fascial fibromatosis: Secondary | ICD-10-CM

## 2014-05-04 MED ORDER — MAGNESIUM OXIDE 400 MG PO TABS: 800.0000 mg | ORAL_TABLET | Freq: Every day | ORAL | Status: DC

## 2014-05-04 NOTE — Progress Notes (Signed)
  Subjective:    CC: Follow-up  HPI: Left shoulder pain: Improved significantly with subcoracoid injection, she does have mild persistent pain at the deltoid insertion.  Hand numbness: Bilateral but worse on the left, worse when waking up and with elbows bed. Ulnar nerve distribution.  Plantar fascia fibromatosis: Improved after injection but having persistent pain in the arch.  Leg cramping: Moderate, persistent, also has pain in the back.  Past medical history, Surgical history, Family history not pertinant except as noted below, Social history, Allergies, and medications have been entered into the medical record, reviewed, and no changes needed.   Review of Systems: No fevers, chills, night sweats, weight loss, chest pain, or shortness of breath.   Objective:    General: Well Developed, well nourished, and in no acute distress.  Neuro: Alert and oriented x3, extra-ocular muscles intact, sensation grossly intact.  HEENT: Normocephalic, atraumatic, pupils equal round reactive to light, neck supple, no masses, no lymphadenopathy, thyroid nonpalpable.  Skin: Warm and dry, no rashes. Cardiac: Regular rate and rhythm, no murmurs rubs or gallops, no lower extremity edema.  Respiratory: Clear to auscultation bilaterally. Not using accessory muscles, speaking in full sentences. Left Shoulder: Inspection reveals no abnormalities, atrophy or asymmetry. Palpation is normal with no tenderness over AC joint or bicipital groove. ROM is full in all planes. Rotator cuff strength normal throughout. Tender to palpation at the deltoid insertion. No signs of impingement with negative Neer and Hawkin's tests, empty can. Speeds and Yergason's tests normal. No labral pathology noted with negative Obrien's, negative crank, negative clunk, and good stability. Normal scapular function observed. No painful arc and no drop arm sign. No apprehension sign Left Elbow: Unremarkable to inspection. Range of  motion full pronation, supination, flexion, extension. Strength is full to all of the above directions Stable to varus, valgus stress. Negative moving valgus stress test. No discrete areas of tenderness to palpation. Ulnar nerve does not sublux. Positive cubital tunnel Tinel's.  Procedure: Real-time Ultrasound Guided Injection of  left deltoid insertion Device: GE Logiq E  Verbal informed consent obtained.  Time-out conducted.  Noted no overlying erythema, induration, or other signs of local infection.  Skin prepped in a sterile fashion.  Local anesthesia: Topical Ethyl chloride.  With sterile technique and under real time ultrasound guidance:  1 mL kenalog 40, 4 mL lidocaine injected easily. Completed without difficulty  Pain immediately resolved suggesting accurate placement of the medication.  Advised to call if fevers/chills, erythema, induration, drainage, or persistent bleeding.  Images permanently stored and available for review in the ultrasound unit.  Impression: Technically successful ultrasound guided injection.  Impression and Recommendations:

## 2014-05-04 NOTE — Assessment & Plan Note (Signed)
Improved after plantar fascia fibroma injection. Return for custom orthotics.

## 2014-05-04 NOTE — Assessment & Plan Note (Signed)
She is giving significant bilateral cramping/radicular symptoms. Magnesium oxide, x-rays. Return in one month.

## 2014-05-04 NOTE — Assessment & Plan Note (Signed)
We will start with compressive wrap at night time to encourage elbow extension. If no resolution we can do a perineural hydrodissection of the ulnar nerve.

## 2014-05-04 NOTE — Patient Instructions (Signed)
Talk to your endocrinologist about possibly trying Mirapex or Requip as a replacement for gabergoline.

## 2014-05-04 NOTE — Assessment & Plan Note (Signed)
Improved significantly with subcoracoid space injection. Continues to have some pain at the deltoid insertion. Subdeltoid injection as above. Return in one month.

## 2014-05-13 ENCOUNTER — Telehealth: Payer: Self-pay

## 2014-05-13 NOTE — Telephone Encounter (Signed)
Patient advised. She will stop the Magnesium and call back on Monday with an update.

## 2014-05-13 NOTE — Telephone Encounter (Signed)
Doesn't sound like an allergic reaction however I am agreeable with discontinuing magnesium oxide for now, did the breathing difficulties and cough resolve with discontinuation?

## 2014-05-13 NOTE — Telephone Encounter (Signed)
Laura [name] reports having a dry barking cough, a feeling of something stuck in her throat, night sweats, flushing in face and trouble taking a deep breath for 6 days. She states she had similar symptoms when she was pregnant and had an allergic reaction to Magnesium sulfate. She did start the Magnesium Oxide on Wednesday. Denies itching, hives, rash or mouth swelling. She does take a Zyrtec daily. Advised her to not take the Magnesium today and I would get further recommendations from Dr Dianah Field.   Patient advised to call 911 or have someone drive her to ED if she starts having shortness of breath or face/mouth swelling.

## 2014-05-26 ENCOUNTER — Encounter: Payer: Self-pay | Admitting: Family Medicine

## 2014-05-26 ENCOUNTER — Ambulatory Visit (INDEPENDENT_AMBULATORY_CARE_PROVIDER_SITE_OTHER): Payer: Managed Care, Other (non HMO) | Admitting: Family Medicine

## 2014-05-26 VITALS — BP 107/71 | HR 72 | Temp 97.8°F | Ht 67.0 in | Wt 163.0 lb

## 2014-05-26 DIAGNOSIS — J209 Acute bronchitis, unspecified: Secondary | ICD-10-CM

## 2014-05-26 MED ORDER — CLARITHROMYCIN 500 MG PO TABS: 500.0000 mg | ORAL_TABLET | Freq: Two times a day (BID) | ORAL | Status: DC

## 2014-05-26 MED ORDER — BENZONATATE 200 MG PO CAPS: 200.0000 mg | ORAL_CAPSULE | Freq: Three times a day (TID) | ORAL | Status: DC | PRN

## 2014-05-26 MED ORDER — BECLOMETHASONE DIPROPIONATE 80 MCG/ACT IN AERS: 2.0000 | INHALATION_SPRAY | Freq: Two times a day (BID) | RESPIRATORY_TRACT | Status: DC

## 2014-05-26 NOTE — Progress Notes (Signed)
   Subjective:    Patient ID: Laura [name], female    DOB: 03-Mar-1976, 38 y.o.   MRN: [id number]  HPI Here to follow up an ER visit in [place] on 05-14-14 for a persistent cough. Her CXR was clear. She was given a nebulizer treatment and a shot of DepoMedrol. She was sent home on a prednisone taper and a Zpack. This has not helped. She still has a hard persistent non-productive cough. She takes hydrocodone syrup at night but cannot use this during the day. No fevers. Her chest and head hurt from coughing all the time.    Review of Systems  Constitutional: Positive for fatigue. Negative for fever.  HENT: Negative for congestion, ear pain, nosebleeds, postnasal drip and sinus pressure.   Eyes: Negative.   Respiratory: Positive for cough and chest tightness. Negative for shortness of breath and wheezing.   Cardiovascular: Negative.   Neurological: Positive for headaches.       Objective:   Physical Exam  Constitutional: She appears well-developed and well-nourished.  HENT:  Right Ear: External ear normal.  Left Ear: External ear normal.  Nose: Nose normal.  Mouth/Throat: Oropharynx is clear and moist.  Eyes: Conjunctivae are normal.  Cardiovascular: Normal rate, regular rhythm, normal heart sounds and intact distal pulses.   Pulmonary/Chest: Effort normal and breath sounds normal. No respiratory distress. She has no wheezes. She has no rales. She exhibits no tenderness.  Lymphadenopathy:    She has no cervical adenopathy.          Assessment & Plan:  Atypical bronchitis. Treat with Biaxin. Add Benzonatate and a steroid inhaler to help with coughing.

## 2014-05-26 NOTE — Progress Notes (Signed)
Pre visit review using our clinic review tool, if applicable. No additional management support is needed unless otherwise documented below in the visit note. 

## 2014-06-07 ENCOUNTER — Encounter: Payer: Self-pay | Admitting: Sports Medicine

## 2014-06-07 ENCOUNTER — Ambulatory Visit (INDEPENDENT_AMBULATORY_CARE_PROVIDER_SITE_OTHER): Payer: Managed Care, Other (non HMO) | Admitting: Sports Medicine

## 2014-06-07 VITALS — BP 106/61 | HR 95 | Wt 163.0 lb

## 2014-06-07 DIAGNOSIS — M722 Plantar fascial fibromatosis: Secondary | ICD-10-CM

## 2014-06-07 DIAGNOSIS — M5416 Radiculopathy, lumbar region: Secondary | ICD-10-CM

## 2014-06-07 NOTE — Progress Notes (Signed)

## 2014-06-07 NOTE — Assessment & Plan Note (Signed)
Persistent cramping in the anterior thighs, and anterior foot suggestive bilateral L4 radiculopathy. She is going to continue to try magnesium which seems to be helping. If persistent symptoms despite magnesium we will pull the trigger for a lumbar spine MRI.

## 2014-06-07 NOTE — Assessment & Plan Note (Signed)
Custom orthotics as above. 

## 2014-06-27 ENCOUNTER — Encounter: Payer: Self-pay | Admitting: Family Medicine

## 2014-06-27 ENCOUNTER — Ambulatory Visit (INDEPENDENT_AMBULATORY_CARE_PROVIDER_SITE_OTHER): Payer: Managed Care, Other (non HMO) | Admitting: Family Medicine

## 2014-06-27 VITALS — BP 106/71 | HR 80 | Temp 98.1°F | Ht 67.0 in | Wt 165.0 lb

## 2014-06-27 DIAGNOSIS — R1032 Left lower quadrant pain: Secondary | ICD-10-CM

## 2014-06-27 DIAGNOSIS — R05 Cough: Secondary | ICD-10-CM

## 2014-06-27 DIAGNOSIS — R053 Chronic cough: Secondary | ICD-10-CM

## 2014-06-27 MED ORDER — DICYCLOMINE HCL 20 MG PO TABS: 20.0000 mg | ORAL_TABLET | Freq: Four times a day (QID) | ORAL | Status: DC | PRN

## 2014-06-27 NOTE — Progress Notes (Signed)
   Subjective:    Patient ID: Laura [name], female    DOB: December 01, 1975, 39 y.o.   MRN: [id number]  HPI Here to follow up a chronic cough and to discuss an abdominal pain that started last night. She has had a dry non-productive cough for about 6 weeks now, it is has not responded to any treatets so far. This has included 2 rounds of antibiotics, decongestants, albuterol inhalers, steroid inhalers, and  oral prednisone. Her exams have been unremarkable and her recent CXR was normal. She also describes a tightness or heaviness in the chest but no real pain. She takes Omeprazole once a day. Also last night she developed a sharp colicky pain in the LLQ which he has had in the past. No nausea or fever. No urinary sx. She has been diagnosed with constipation predominant IBS and takes Amitiza. Her last BM was 3 days ago. She has also taken Colace for several days.    Review of Systems  Constitutional: Negative.   Respiratory: Positive for cough and chest tightness. Negative for shortness of breath and wheezing.   Cardiovascular: Negative.   Gastrointestinal: Positive for abdominal pain, constipation and abdominal distention. Negative for nausea, vomiting, diarrhea, blood in stool, anal bleeding and rectal pain.  Genitourinary: Negative.        Objective:   Physical Exam  Constitutional: She appears well-developed and well-nourished. No distress.  HENT:  Right Ear: External ear normal.  Left Ear: External ear normal.  Nose: Nose normal.  Mouth/Throat: Oropharynx is clear and moist.  Eyes: Conjunctivae are normal.  Neck: No thyromegaly present.  Cardiovascular: Normal rate, regular rhythm, normal heart sounds and intact distal pulses.   Pulmonary/Chest: Effort normal and breath sounds normal. No respiratory distress. She has no wheezes. She has no rales.  Abdominal: Soft. Bowel sounds are normal. She exhibits no distension and no mass. There is no rebound and no guarding.  Tender in the LLQ    Lymphadenopathy:    She has no cervical adenopathy.          Assessment & Plan:  I think her cough may be related to inadequately treated GERD. I would suggest she take a PPI twice daily but she already has an appt to see a new GI doctor in [place] tomorrow morning. I will wait and see what advice he may give her then. As for the lower abdominal cramps, these seem to be consistent with IBS. Try Bentyl 20 mg QID prn.

## 2014-06-27 NOTE — Progress Notes (Signed)
Pre visit review using our clinic review tool, if applicable. No additional management support is needed unless otherwise documented below in the visit note. 

## 2014-07-11 ENCOUNTER — Ambulatory Visit: Payer: Managed Care, Other (non HMO) | Admitting: Sports Medicine

## 2014-08-01 HISTORY — PX: COLONOSCOPY: SHX174

## 2014-08-01 HISTORY — PX: ESOPHAGOGASTRODUODENOSCOPY: SHX1529

## 2014-08-03 ENCOUNTER — Other Ambulatory Visit: Payer: Self-pay | Admitting: Family Medicine

## 2014-08-04 NOTE — Telephone Encounter (Signed)
Can we refill this? 

## 2014-08-06 ENCOUNTER — Other Ambulatory Visit: Payer: Self-pay | Admitting: Family Medicine

## 2014-09-09 ENCOUNTER — Encounter: Payer: Self-pay | Admitting: Emergency Medicine

## 2014-09-09 ENCOUNTER — Emergency Department
Admission: EM | Admit: 2014-09-09 | Discharge: 2014-09-09 | Disposition: A | Discharge status: Home / Self Care | Payer: Managed Care, Other (non HMO) | Source: Home / Self Care | Attending: Family Medicine | Admitting: Family Medicine

## 2014-09-09 ENCOUNTER — Emergency Department (INDEPENDENT_AMBULATORY_CARE_PROVIDER_SITE_OTHER): Payer: Managed Care, Other (non HMO)

## 2014-09-09 DIAGNOSIS — J0101 Acute recurrent maxillary sinusitis: Secondary | ICD-10-CM

## 2014-09-09 DIAGNOSIS — J302 Other seasonal allergic rhinitis: Secondary | ICD-10-CM

## 2014-09-09 DIAGNOSIS — R0981 Nasal congestion: Secondary | ICD-10-CM | POA: Diagnosis not present

## 2014-09-09 MED ORDER — PREDNISONE 20 MG PO TABS: 20.0000 mg | ORAL_TABLET | Freq: Two times a day (BID) | ORAL | Status: DC

## 2014-09-09 MED ORDER — AZITHROMYCIN 250 MG PO TABS: ORAL_TABLET | ORAL | Status: DC

## 2014-09-09 MED ORDER — FLUTICASONE PROPIONATE 50 MCG/ACT NA SUSP: NASAL | Status: DC

## 2014-09-09 NOTE — ED Provider Notes (Signed)
CSN: [id number]     Arrival date & time 09/09/14  1807 History   First MD Initiated Contact with Patient 09/09/14 1834     Chief Complaint  Patient presents with  . Sinusitis      HPI Comments: Two weeks ago patient developed increased sinus congestion and facial pressure that has persisted. Her maxillary teeth are uncomfortable, and pain radiates to her ears.  She had mild sore throat initially.  For the past several nights she has had chills.  The history is provided by the patient.    Past Medical History  Diagnosis Date  . IBS (irritable bowel syndrome)   . Depression   . GERD (gastroesophageal reflux disease)   . Restless legs   . Insomnia   . Headache(784.0)   . UTI (lower urinary tract infection)     sees Dr Karsten Ro  . Anemia   . Anxiety   . Pituitary microadenoma with hyperprolactinemia     sees Dr. Mare Ferrari in [facility]   Past Surgical History  Procedure Laterality Date  . Tonsillectomy    . Abdominal hysterectomy  Nov 2008  . Neck surgery  JULY 1996    L side lymph node removal  . Wisdom tooth extraction    . Tubal ligation     Family History  Problem Relation Age of Onset  . Alcohol abuse Father   . Coronary artery disease Maternal Grandmother     paternal grandmother  . Diabetes Maternal Grandfather   . Hypertension Mother     father, MGF, MGM, PGF, PGM  . Thyroid disease Mother     maternal grandmother  . Colon cancer Neg Hx   . Esophageal cancer Neg Hx   . Stomach cancer Neg Hx   . Aneurysm Paternal Grandfather     aortic   History  Substance Use Topics  . Smoking status: Never Smoker   . Smokeless tobacco: Never Used  . Alcohol Use: No   OB History    No data available     Review of Systems + sore throat + hoarse + cough No pleuritic pain, but has tightness in anterior chest No wheezing + nasal congestion + post-nasal drainage +sinus pain/pressure No itchy/red eyes ? Earache + dizziness No hemoptysis No SOB No fever, +  chills No nausea No vomiting + abdominal pain No diarrhea No urinary symptoms No skin rash + fatigue + myalgias + headache Used OTC meds without relief  Allergies  Magnesium sulfate; Dilaudid; Hydromorphone hcl; Iohexol; Ivp dye; Penicillins; and Levofloxacin  Home Medications   Prior to Admission medications   Medication Sig Start Date End Date Taking? Authorizing Provider  cabergoline (DOSTINEX) 0.5 MG tablet Take 0.25 mg by mouth 2 (two) times a week.   Yes Historical Provider, MD  cetirizine (ZYRTEC) 10 MG tablet Take 10 mg by mouth daily.   Yes Historical Provider, MD  Dexlansoprazole 30 MG capsule Take 30 mg by mouth daily.   Yes Historical Provider, MD  Linaclotide Rolan Lipa) 145 MCG CAPS capsule Take 145 mcg by mouth daily.   Yes Historical Provider, MD  mirabegron ER (MYRBETRIQ) 25 MG TB24 tablet Take 25 mg by mouth daily.   Yes Historical Provider, MD  pseudoephedrine (SUDAFED) 30 MG tablet Take 30 mg by mouth every 4 (four) hours as needed for congestion.   Yes Historical Provider, MD  azithromycin (ZITHROMAX Z-PAK) 250 MG tablet Take 2 tabs today; then begin one tab once daily for 4 more days. 09/09/14   Annie Main  A Kwamane Whack, MD  beclomethasone (QVAR) 80 MCG/ACT inhaler Inhale 2 puffs into the lungs 2 (two) times daily. 05/26/14   Laurey Morale, MD  benzonatate (TESSALON) 200 MG capsule Take 1 capsule (200 mg total) by mouth 3 (three) times daily as needed for cough. 05/26/14   Laurey Morale, MD  buPROPion (WELLBUTRIN XL) 300 MG 24 hr tablet TAKE 1 TABLET BY MOUTH IN THE MORNING 08/08/14   Laurey Morale, MD  cholecalciferol (VITAMIN D) 1000 UNITS tablet Take 1,000 Units by mouth daily. Take 2     Historical Provider, MD  clarithromycin (BIAXIN) 500 MG tablet Take 1 tablet (500 mg total) by mouth 2 (two) times daily. Patient not taking: Reported on 06/27/2014 05/26/14   Laurey Morale, MD  diclofenac (VOLTAREN) 75 MG EC tablet Take 1 tablet (75 mg total) by mouth 2 (two) times  daily. Patient not taking: Reported on 06/27/2014 02/17/14   Silverio Decamp, MD  dicyclomine (BENTYL) 20 MG tablet Take 1 tablet (20 mg total) by mouth every 6 (six) hours as needed for spasms. 06/27/14   Laurey Morale, MD  fluticasone Asencion Islam) 50 MCG/ACT nasal spray Place two sprays in each nostril once daily 09/09/14   Kandra Nicolas, MD  glycopyrrolate (ROBINUL-FORTE) 2 MG tablet Take 1 tablet (2 mg total) by mouth 2 (two) times daily. Patient not taking: Reported on 06/27/2014 09/17/10   Amy S Esterwood, PA-C  ibuprofen (ADVIL,MOTRIN) 800 MG tablet Take 800 mg by mouth every 8 (eight) hours as needed.      Historical Provider, MD  lubiprostone (AMITIZA) 24 MCG capsule Take 1 capsule (24 mcg total) by mouth 2 (two) times daily. 05/03/14   Laurey Morale, MD  magnesium oxide (MAG-OX) 400 MG tablet Take 2 tablets (800 mg total) by mouth at bedtime. Patient not taking: Reported on 06/27/2014 05/04/14   Silverio Decamp, MD  omeprazole (PRILOSEC) 40 MG capsule TAKE 1 TABLET BY MOUTH EVERY DAY 07/18/13   Laurey Morale, MD  predniSONE (DELTASONE) 20 MG tablet Take 1 tablet (20 mg total) by mouth 2 (two) times daily. Take with food. 09/09/14   Kandra Nicolas, MD  traMADol (ULTRAM) 50 MG tablet 1-2 tabs by mouth Q8 hours, maximum 6 tabs per day. Patient not taking: Reported on 06/27/2014 02/17/14   Silverio Decamp, MD  Vitamin D, Ergocalciferol, (DRISDOL) 50000 UNITS CAPS capsule TAKE 1 CAPSULE (50,000 UNITS TOTAL) BY MOUTH EVERY 7 (SEVEN) DAYS. 08/08/14   Laurey Morale, MD   BP 115/78 mmHg  Pulse 78  Temp(Src) 98 F (36.7 C) (Oral)  Ht 5\' 8"  (1.727 m)  Wt 170 lb (77.111 kg)  BMI 25.85 kg/m2  SpO2 100% Physical Exam Nursing notes and Vital Signs reviewed. Appearance:  Patient appears stated age, and in no acute distress Eyes:  Pupils are equal, round, and reactive to light and accomodation.  Extraocular movement is intact.  Conjunctivae are not inflamed  Ears:  Canals normal.  Tympanic  membranes normal.  Nose:  Congested turbinates.  Maxillary sinus tenderness is present.  Pharynx:  Normal Neck:  Supple.   Tender enlarged posterior nodes are palpated bilaterally  Lungs:  Clear to auscultation.  Breath sounds are equal. Chest:  Distinct tenderness to palpation over the mid-sternum.   Heart:  Regular rate and rhythm without murmurs, rubs, or gallops.  Abdomen:  Nontender without masses or hepatosplenomegaly.  Bowel sounds are present.  No CVA or flank tenderness.  Extremities:  No edema.  No calf tenderness Skin:  No rash present.   ED Course  Procedures  none   Imaging Review Dg Sinuses Complete  09/09/2014   CLINICAL DATA:  Sinus pressure and congestion for 2 weeks  EXAM: PARANASAL SINUSES - COMPLETE 3 + VIEW  COMPARISON:  None.  FINDINGS: The frontal, ethmoid, maxillary and sphenoid sinuses are all well aerated without air-fluid level or mucosal abnormality. Incomplete pneumatization of the frontal lobe is noted particularly on the right. No bony abnormality is seen.  IMPRESSION: No acute abnormality noted.   Electronically Signed   By: Inez Catalina M.D.   On: 09/09/2014 19:30     MDM   1. Acute recurrent maxillary sinusitis   2. Seasonal allergic rhinitis    Begin prednisone burst, and Z-pack.  Add Flonase spray. Continue Pseudoephedrine for sinus congestion.   May use Afrin nasal spray (or generic oxymetazoline) twice daily for about 5 days.  Also recommend using saline nasal spray several times daily and saline nasal irrigation (AYR is a common brand).  Use Flonase nasal spray each morning after using Afrin nasal spray and saline nasal irrigation. Stop all antihistamines for now, and other non-prescription cough/cold preparations.   Follow-up with family doctor if not improving about10 days.     Kandra Nicolas, MD 09/10/14 2212

## 2014-09-09 NOTE — Discharge Instructions (Signed)
Continue Pseudoephedrine for sinus congestion.   May use Afrin nasal spray (or generic oxymetazoline) twice daily for about 5 days.  Also recommend using saline nasal spray several times daily and saline nasal irrigation (AYR is a common brand).  Use Flonase nasal spray each morning after using Afrin nasal spray and saline nasal irrigation. Stop all antihistamines for now, and other non-prescription cough/cold preparations.   Follow-up with family doctor if not improving about10 days.

## 2014-09-09 NOTE — ED Notes (Signed)
Sinus headache, pain and pressure behind eyes, face and teeth hurt, ears hurt x 2 weeks

## 2014-09-29 ENCOUNTER — Telehealth: Payer: Self-pay | Admitting: Cardiology

## 2014-09-29 NOTE — Telephone Encounter (Signed)
Received records from [facility] for appointment on 10/03/14 with Dr Percival Spanish.  Records given to [facility] (medical records) for Dr Hochrein's schedule on 10/03/14. lp

## 2014-09-30 ENCOUNTER — Ambulatory Visit (INDEPENDENT_AMBULATORY_CARE_PROVIDER_SITE_OTHER): Payer: Managed Care, Other (non HMO) | Admitting: Sports Medicine

## 2014-09-30 ENCOUNTER — Encounter: Payer: Self-pay | Admitting: Sports Medicine

## 2014-09-30 VITALS — BP 98/66 | HR 54 | Ht 68.0 in | Wt 170.0 lb

## 2014-09-30 DIAGNOSIS — M25512 Pain in left shoulder: Secondary | ICD-10-CM

## 2014-09-30 NOTE — Assessment & Plan Note (Signed)
Had a good response in the distant past with a subcoracoid space injection, afterwards she had some residual pain so I did a deltoid insertion injection which worked extremely well. Now a year and a half later she is having recurrence of pain so a repeat injection was placed, this time pain was referral to the subacromial bursa.

## 2014-09-30 NOTE — Progress Notes (Signed)
  Subjective:    CC: Left shoulder pain  HPI: Laura [name] returns, she had a great response to left-sided subcoracoid as well as deltoid insertion injections in the distant past. She is having recurrence of pain localized over the posterior deltoid and worse with overhead activities. Moderate, persistent without radiation, no paresthesias or neck pain.  Past medical history, Surgical history, Family history not pertinant except as noted below, Social history, Allergies, and medications have been entered into the medical record, reviewed, and no changes needed.   Review of Systems: No fevers, chills, night sweats, weight loss, chest pain, or shortness of breath.   Objective:    General: Well Developed, well nourished, and in no acute distress.  Neuro: Alert and oriented x3, extra-ocular muscles intact, sensation grossly intact.  HEENT: Normocephalic, atraumatic, pupils equal round reactive to light, neck supple, no masses, no lymphadenopathy, thyroid nonpalpable.  Skin: Warm and dry, no rashes. Cardiac: Regular rate and rhythm, no murmurs rubs or gallops, no lower extremity edema.  Respiratory: Clear to auscultation bilaterally. Not using accessory muscles, speaking in full sentences. Left Shoulder: Inspection reveals no abnormalities, atrophy or asymmetry. Palpation is normal with no tenderness over AC joint or bicipital groove. ROM is full in all planes. Rotator cuff strength weak to abduction, positive Neer sign, negative Hawkins, positive empty can sign. Only minimal tenderness at the deltoid insertion Speeds and Yergason's tests normal. No labral pathology noted with negative Obrien's, negative crank, negative clunk, and good stability. Normal scapular function observed. No painful arc and no drop arm sign. No apprehension sign  Procedure: Real-time Ultrasound Guided Injection of left subacromial bursa Device: GE Logiq E  Verbal informed consent obtained.  Time-out conducted.  Noted  no overlying erythema, induration, or other signs of local infection.  Skin prepped in a sterile fashion.  Local anesthesia: Topical Ethyl chloride.  With sterile technique and under real time ultrasound guidance:  Noted intact rotator cuff, 1 mL kenalog 40, 4 mL lidocaine injected easily into the subacromial bursa Completed without difficulty  Pain immediately resolved suggesting accurate placement of the medication.  Advised to call if fevers/chills, erythema, induration, drainage, or persistent bleeding.  Images permanently stored and available for review in the ultrasound unit.  Impression: Technically successful ultrasound guided injection.  Impression and Recommendations:

## 2014-10-03 ENCOUNTER — Other Ambulatory Visit: Payer: Self-pay | Admitting: Cardiology

## 2014-10-03 ENCOUNTER — Ambulatory Visit (INDEPENDENT_AMBULATORY_CARE_PROVIDER_SITE_OTHER): Payer: Managed Care, Other (non HMO) | Admitting: Cardiology

## 2014-10-03 ENCOUNTER — Encounter: Payer: Self-pay | Admitting: Cardiology

## 2014-10-03 VITALS — BP 110/80 | HR 74 | Ht 68.0 in | Wt 165.7 lb

## 2014-10-03 DIAGNOSIS — R072 Precordial pain: Secondary | ICD-10-CM | POA: Diagnosis not present

## 2014-10-03 DIAGNOSIS — R079 Chest pain, unspecified: Secondary | ICD-10-CM | POA: Diagnosis not present

## 2014-10-03 NOTE — Progress Notes (Signed)
Cardiology Office Note   Date:  10/03/2014   ID:  Janisa, Labus 21-Jul-1975, MRN [id number]  PCP:  Laurey Morale, MD  Cardiologist:   Minus Breeding, MD   Chief Complaint  Patient presents with  . Chest Pain      History of Present Illness: Laura [name] is a 39 y.o. female who presents for evaluation of chest discomfort. She has no past cardiac history. She has had some problems last several weeks or months with chest discomfort. She's had cough. She was treated for reflux and hasn't improved in some of her symptoms. I have GI records which I reviewed. She was treated with antibiotics at one point after an emergency room visit. She might have possible bronchitis. However, despite some improvements she has continued to have chest discomfort. It is there all of the time. He has mild waxes and wanes. She can't necessarily make it worse with activities. She was even on a treadmill recently and her heart rate went up quite a bit. She did not necessarily have increased chest discomfort, excessive shortness of breath, nausea vomiting or other associated symptoms. She does notice that her heart rate at rest is increased and with activity goes up much higher than she would suspect should.   Past Medical History  Diagnosis Date  . IBS (irritable bowel syndrome)   . Depression   . GERD (gastroesophageal reflux disease)   . Restless legs   . Insomnia   . Headache(784.0)   . UTI (lower urinary tract infection)     sees Dr Karsten Ro  . Anemia   . Anxiety   . Pituitary microadenoma with hyperprolactinemia     sees Dr. Mare Ferrari in [facility]    Past Surgical History  Procedure Laterality Date  . Tonsillectomy    . Abdominal hysterectomy  Nov 2008  . Neck surgery  JULY 1996    L side lymph node removal  . Wisdom tooth extraction    . Tubal ligation       Current Outpatient Prescriptions  Medication Sig Dispense Refill  . buPROPion (WELLBUTRIN XL) 300 MG 24 hr tablet TAKE 1  TABLET BY MOUTH IN THE MORNING 30 tablet 3  . cabergoline (DOSTINEX) 0.5 MG tablet Take 0.25 mg by mouth once a week.     . cetirizine (ZYRTEC) 10 MG tablet Take 10 mg by mouth daily.    Marland Kitchen DEXILANT 60 MG capsule Take 1 tablet by mouth daily.  3  . doxycycline (VIBRA-TABS) 100 MG tablet Take 100 mg by mouth daily.     Marland Kitchen ibuprofen (ADVIL,MOTRIN) 800 MG tablet Take 800 mg by mouth every 8 (eight) hours as needed.      . polyethylene glycol powder (GLYCOLAX/MIRALAX) powder Take 1 g by mouth See admin instructions.  0  . Vitamin D, Ergocalciferol, (DRISDOL) 50000 UNITS CAPS capsule Take 1 capsule by mouth once a week.     No current facility-administered medications for this visit.    Allergies:   Magnesium sulfate; Dilaudid; Hydromorphone hcl; Iohexol; Ivp dye; Penicillins; and Levofloxacin    Social History:  The patient  reports that she has never smoked. She has never used smokeless tobacco. She reports that she does not drink alcohol or use illicit drugs.   Family History:  The patient's family history includes Alcohol abuse in her father; Aneurysm in her paternal grandfather; Arrhythmia in her mother; Coronary artery disease in her maternal grandmother; Diabetes in her maternal grandfather; Hypertension in her mother;  Thyroid disease in her mother. There is no history of Colon cancer, Esophageal cancer, or Stomach cancer.   Of note she does not know all the details of her family and she was raised in foster homes.   ROS:  Please see the history of present illness.   Otherwise, review of systems are positive for none.   All other systems are reviewed and negative.    PHYSICAL EXAM: VS:  BP 110/80 mmHg  Pulse 74  Ht 5\' 8"  (1.727 m)  Wt 165 lb 11.2 oz (75.161 kg)  BMI 25.20 kg/m2 , BMI Body mass index is 25.2 kg/(m^2). GENERAL:  Well appearing HEENT:  Pupils equal round and reactive, fundi not visualized, oral mucosa unremarkable NECK:  No jugular venous distention, waveform within  normal limits, carotid upstroke brisk and symmetric, no bruits, no thyromegaly LYMPHATICS:  No cervical, inguinal adenopathy LUNGS:  Clear to auscultation bilaterally BACK:  No CVA tenderness CHEST:  Unremarkable HEART:  PMI not displaced or sustained,S1 and S2 within normal limits, no S3, no S4, no clicks, no rubs, no murmurs ABD:  Flat, positive bowel sounds normal in frequency in pitch, no bruits, no rebound, no guarding, no midline pulsatile mass, no hepatomegaly, no splenomegaly EXT:  2 plus pulses throughout, no edema, no cyanosis no clubbing SKIN:  No rashes no nodules NEURO:  Cranial nerves II through XII grossly intact, motor grossly intact throughout PSYCH:  Cognitively intact, oriented to person place and time    EKG:  EKG is ordered today. The ekg ordered today demonstrates sinus rhythm, rate 74, axis within normal limits, intervals within normal limits, no acute ST-T wave changes.   Recent Labs: 01/04/2014: ALT 14; BUN 16; Creatinine 0.9; Hemoglobin 13.0; Platelets 198.0; Potassium 4.5; Sodium 138; TSH 2.69      Wt Readings from Last 3 Encounters:  10/03/14 165 lb 11.2 oz (75.161 kg)  09/30/14 170 lb (77.111 kg)  09/09/14 170 lb (77.111 kg)      Other studies Reviewed: Additional studies/ records that were reviewed today include: Outside GI records. Review of the above records demonstrates:  Please see elsewhere in the note.     ASSESSMENT AND PLAN:  CHEST PAIN:  The patient's chest pain is atypical. I think the pretest probability of obstructive coronary disease is low. I would like to bring her back for a POET (Plain Old Exercise Treadmill).  This will also allow [place] to assess her heart rate response with activity. If this is normal as I suspect no further cardiac workup would be suggested.  TACHYCARDIA:  We talked about training and increasing her vagal tone. I will defer to her primary providers as I don't see a TSH.  HIGH RISK MED:  Of note the Dostinex can be  associated with valvular heart disease but I would not suspect that in this patient.  Current medicines are reviewed at length with the patient today.  The patient does not have concerns regarding medicines.  The following changes have been made:  no change  Labs/ tests ordered today include: POET (Plain Old Exercise Treadmill)    Orders Placed This Encounter  Procedures  . EKG 12-Lead  . Exercise tolerance test     Disposition:   FU with me as needed.     Signed, Minus Breeding, MD  10/03/2014 9:07 AM    [place]

## 2014-10-03 NOTE — Patient Instructions (Signed)
Your physician recommends that you schedule a follow-up appointment As Needed  Your physician has requested that you have an exercise tolerance test. For further information please visit [email or web address]. Please also follow instruction sheet, as given.

## 2014-10-05 ENCOUNTER — Other Ambulatory Visit: Payer: Self-pay | Admitting: *Deleted

## 2014-10-05 DIAGNOSIS — R079 Chest pain, unspecified: Secondary | ICD-10-CM

## 2014-10-25 ENCOUNTER — Telehealth (HOSPITAL_COMMUNITY): Payer: Self-pay | Admitting: *Deleted

## 2014-10-25 NOTE — Telephone Encounter (Signed)
Patient given detailed instructions for GXT for test on 10/27/14 at 3:30 pm Patient verbalized understanding. Hubbard Robinson, RN

## 2014-10-26 ENCOUNTER — Ambulatory Visit (INDEPENDENT_AMBULATORY_CARE_PROVIDER_SITE_OTHER): Payer: Managed Care, Other (non HMO) | Admitting: Family Medicine

## 2014-10-26 ENCOUNTER — Encounter: Payer: Self-pay | Admitting: Family Medicine

## 2014-10-26 VITALS — BP 105/73 | HR 76 | Temp 98.6°F | Ht 68.0 in | Wt 168.0 lb

## 2014-10-26 DIAGNOSIS — R599 Enlarged lymph nodes, unspecified: Secondary | ICD-10-CM | POA: Insufficient documentation

## 2014-10-26 DIAGNOSIS — R079 Chest pain, unspecified: Secondary | ICD-10-CM | POA: Diagnosis not present

## 2014-10-26 DIAGNOSIS — R591 Generalized enlarged lymph nodes: Secondary | ICD-10-CM

## 2014-10-26 DIAGNOSIS — K219 Gastro-esophageal reflux disease without esophagitis: Secondary | ICD-10-CM | POA: Diagnosis not present

## 2014-10-26 NOTE — Progress Notes (Signed)
Pre visit review using our clinic review tool, if applicable. No additional management support is needed unless otherwise documented below in the visit note. 

## 2014-10-26 NOTE — Progress Notes (Signed)
   Subjective:    Patient ID: Laura [name], female    DOB: [birth year], 39 y.o.   MRN: [id number]  HPI Here for advice. She has been seeing a GI named Dr. Eusebio Friendly in [place] for issues of abdominal pain, constipation, and chest pains. She had an EGD and colonoscopy in February, and these showed only some polyps. She had labs in February including a CBC and CMP which were unremarkable. She had an abdominal CT recently which was remarkable only for a few pericolonic lymph nodes. These were of uncertain significance but Dr. Cindee Salt was not too worried about them. She had a normal CXR last December and she has not adenopathy on exam, so the chance of her having lymphoma is quite remote. She is worried because her brother was recently diagnosed with a type of lymphoma. She is currently taking only Miralax and Duculux for her bowels, and she has stopped Linzess. Her chest pains are probably from esophageal spasms but she is seeing Dr. Percival Spanish about this to rule out cardiologic issues. She is set for a treadmill evaluation tomorrow. Dr. Cindee Salt will probably end up doing an esophageal manometry soon.    Review of Systems  Constitutional: Negative.   Respiratory: Negative.   Cardiovascular: Positive for chest pain. Negative for palpitations and leg swelling.  Gastrointestinal: Positive for abdominal pain, constipation and abdominal distention. Negative for nausea, vomiting, diarrhea, blood in stool, anal bleeding and rectal pain.       Objective:   Physical Exam  Constitutional: She appears well-developed and well-nourished.  Cardiovascular: Normal rate, regular rhythm, normal heart sounds and intact distal pulses.   Pulmonary/Chest: Effort normal and breath sounds normal.  Abdominal: Soft. Bowel sounds are normal. She exhibits no distension and no mass. There is no tenderness. There is no rebound and no guarding.  Lymphadenopathy:    She has no cervical adenopathy.          Assessment &  Plan:  She is getting a treadmill tomorrow which is a good idea, even though it is very unlikely that she has cardiac issues. I reassured her that the adenopathy seen on her abdominal CT is most likely benign, and that lymphomas are generally not inherited in families. She will return prn

## 2014-10-27 ENCOUNTER — Ambulatory Visit (HOSPITAL_COMMUNITY)
Admission: RE | Admit: 2014-10-27 | Discharge: 2014-10-27 | Disposition: A | Discharge status: Home / Self Care | Payer: Managed Care, Other (non HMO) | Source: Ambulatory Visit | Attending: Cardiology | Admitting: Cardiology

## 2014-10-27 DIAGNOSIS — R079 Chest pain, unspecified: Secondary | ICD-10-CM | POA: Diagnosis not present

## 2014-10-28 ENCOUNTER — Encounter: Payer: Self-pay | Admitting: Sports Medicine

## 2014-10-28 ENCOUNTER — Ambulatory Visit (INDEPENDENT_AMBULATORY_CARE_PROVIDER_SITE_OTHER): Payer: Managed Care, Other (non HMO) | Admitting: Sports Medicine

## 2014-10-28 VITALS — BP 105/68 | HR 82 | Ht 68.0 in | Wt 169.0 lb

## 2014-10-28 DIAGNOSIS — M25512 Pain in left shoulder: Secondary | ICD-10-CM | POA: Diagnosis not present

## 2014-10-28 DIAGNOSIS — R071 Chest pain on breathing: Secondary | ICD-10-CM | POA: Diagnosis not present

## 2014-10-28 LAB — EXERCISE TOLERANCE TEST
CHL CUP RESTING HR STRESS: 101 {beats}/min
CHL CUP STRESS STAGE 1 HR: 101 {beats}/min
CHL CUP STRESS STAGE 1 SBP: 119 mmHg
CHL CUP STRESS STAGE 2 HR: 103 {beats}/min
CHL CUP STRESS STAGE 4 DBP: 77 mmHg
CHL CUP STRESS STAGE 4 GRADE: 12 %
CHL CUP STRESS STAGE 5 GRADE: 14 %
CHL CUP STRESS STAGE 5 HR: 184 {beats}/min
CHL CUP STRESS STAGE 5 SPEED: 3.4 mph
CHL CUP STRESS STAGE 6 DBP: 60 mmHg
CHL CUP STRESS STAGE 6 HR: 176 {beats}/min
CHL CUP STRESS STAGE 6 SBP: 129 mmHg
CSEPEW: 10.1 METS
CSEPPBP: 159 mmHg
CSEPPHR: 184 {beats}/min
Exercise duration (min): 5 min
MPHR: 181 {beats}/min
Percent HR: 101 %
Percent of predicted max HR: 101 %
RPE: 29256
Stage 1 DBP: 96 mmHg
Stage 1 Grade: 0 %
Stage 1 Speed: 0 mph
Stage 2 Grade: 0 %
Stage 2 Speed: 0 mph
Stage 3 Grade: 1.4 %
Stage 3 HR: 105 {beats}/min
Stage 3 Speed: 0.8 mph
Stage 4 HR: 171 {beats}/min
Stage 4 SBP: 150 mmHg
Stage 4 Speed: 2.5 mph
Stage 5 DBP: 88 mmHg
Stage 5 SBP: 159 mmHg
Stage 6 Grade: 0 %
Stage 6 Speed: 0 mph
Stage 7 DBP: 76 mmHg
Stage 7 Grade: 0 %
Stage 7 HR: 111 {beats}/min
Stage 7 SBP: 120 mmHg
Stage 7 Speed: 0 mph

## 2014-10-28 MED ORDER — DIAZEPAM 5 MG PO TABS: ORAL_TABLET | ORAL | Status: DC

## 2014-10-28 NOTE — Patient Instructions (Addendum)
Consider lung spirometry, baseline, post exercise, and postbronchodilator. Exercise-Induced Bronchospasm A bronchospasm is a condition that is commonly caused by exercise, in which the muscles around the bronchioles (airways to the lungs) tighten, causing the airway to constrict. Exercise-induced bronchospasms are usually associated with short periods of vigorous activity. Many people who experience an exercise-induced bronchospasm may not notice at the time of the event; however, the athlete may later experience symptoms that negatively affect training and performance. SYMPTOMS   High-pitched sounds with breathing (wheezing).  Coughing.  Increased work of breathing (dyspnea).  Rapid breathing (hyperventilation).  Chest pain.  Symptoms occurring after exercise is completed (late-phase reaction). CAUSES  It is not known why certain individuals experience bronchospasms. Respiratory specialists currently think that the cool or dry air breathed in may cause damage to the lining of the bronchioles, which elicits an inflammatory response. The inflammation causes the airways to narrow and symptoms then occur. RISK INCREASES WITH:  Viral infections.  Exercise in cold air.  Exercise in dry conditions.  Poor physical fitness.  High-intensity exercise.  No warm-up before play.  Frequent exposure to substances that produce allergic reactions (allergens). PREVENTION   Improve conditioning.  Treat allergies.  Breathe warm air (cover mouth and nose with a towel or scarf).  Warm up for an appropriate period of time before physical activity.  Gradually decrease intensity (warm down) for an appropriate period of time after physical activity. PROGNOSIS  Most people with exercise-induced asthma respond well to medication. Patients are typically prescribed an inhaler to treat bronchospasms. However, if symptoms persist despite treatment and continue to affect performance, individuals may need  to consider avoiding activities that produce symptoms. RELATED COMPLICATIONS   Decreased athletic performance.  Inability to condition as well as expected.  Side effects from medications. TREATMENT   Maintain physical fitness.  Run with a scarf or towel over your mouth in cold, dry air.  Complete at least 10 minutes of warm-up before high-intensity exercise.  Warm down after play.  Treat allergies. MEDICATION  The usual initial medication is an albuterol inhaler, which expands the constricted bronchioles.  The second-line medication is inhaled corticosteroids, which reduce inflammation in the airway.  Alternative medications included sodium cromoglycate and nedocromil inhalers.  Long-acting medications such as salmeterol can also be used as second-line medications. ACTIVITY  If medications are able to treat the offending symptoms, then no activity modification is required. If you know you will be training or competing in cold or dry climates take extra precautions to prevent symptoms. DIET  No specific diet is recommended.  SEEK MEDICAL CARE IF:   Greater than normal fatigue with exercise.  Greater than normal difficulty breathing occurs with exercise.  Increased wheezing with exercise.  You appear to be breathing harder and faster than expected with training.  Allergies appear to be uncontrollable.  You experience chest pain with exercise. Document Released: 05/27/2005 Document Revised: 10/11/2013 Document Reviewed: 09/08/2008 [facility] Patient Information 2015 [place], [place]. This information is not intended to replace advice given to you by your health care provider. Make sure you discuss any questions you have with your health care provider.

## 2014-10-28 NOTE — Assessment & Plan Note (Signed)
Persistent left shoulder pain despite 2 subacromial injections. This is post a fall. At this point we do need advanced imaging in the form of an MR arthrogram of the shoulder. I will see her back to do the arthrogram injection, I am going to give her Valium for preprocedural anxiolysis. Patient is 40, no history of hypertension, we do not need preinjection renal function.

## 2014-10-28 NOTE — Assessment & Plan Note (Signed)
Recent negative treadmill stress test. Patient did get chest pain that was pleuritic and on reproducible on palpation after the stress test. This is suggestive of exercise-induced bronchoconstriction. It does sound that she is going to undergo esophageal manometry looking for esophageal spasm. I don't think that this is the correct diagnosis, we certainly could proceed with spirometry, both baseline, post exercise, and postbronchodilator.

## 2014-10-28 NOTE — Progress Notes (Signed)
  Subjective:    CC: Follow-up  HPI: Left shoulder pain: Unfortunately did not respond sufficiently to a subacromial injection, she did have an injury in the past, symptoms are predominantly impingement related. Moderate, persistent without radiation. Localized to over the deltoid and the anterior jointline without significant mechanical symptoms.  Laura [name] is also being worked up for chest pain, she did have a recent negative treadmill stress test, her chest pain occurs after exercise, is substernal and pleuritic, without radiation. She does get a cough, occasional wheeze during these episodes. Chest pain is also not reproducible with palpation on the chest wall. Her cardiologist has referred her to gastroenterology looking into esophageal spasm, and she does think that she has an esophageal manometry test coming up. She has never undergone investigation for exercise-induced bronchoconstriction.  Past medical history, Surgical history, Family history not pertinant except as noted below, Social history, Allergies, and medications have been entered into the medical record, reviewed, and no changes needed.   Review of Systems: No fevers, chills, night sweats, weight loss, chest pain, or shortness of breath.   Objective:    General: Well Developed, well nourished, and in no acute distress.  Neuro: Alert and oriented x3, extra-ocular muscles intact, sensation grossly intact.  HEENT: Normocephalic, atraumatic, pupils equal round reactive to light, neck supple, no masses, no lymphadenopathy, thyroid nonpalpable.  Skin: Warm and dry, no rashes. Cardiac: Regular rate and rhythm, no murmurs rubs or gallops, no lower extremity edema.  Respiratory: Clear to auscultation bilaterally. Not using accessory muscles, speaking in full sentences.  Impression and Recommendations:    I spent 40 minutes with this patient, greater than 50% was face-to-face time counseling regarding the above diagnoses.

## 2014-11-09 ENCOUNTER — Ambulatory Visit: Payer: Managed Care, Other (non HMO) | Admitting: Sports Medicine

## 2014-11-14 ENCOUNTER — Ambulatory Visit (INDEPENDENT_AMBULATORY_CARE_PROVIDER_SITE_OTHER): Payer: Managed Care, Other (non HMO) | Admitting: Sports Medicine

## 2014-11-14 ENCOUNTER — Ambulatory Visit (INDEPENDENT_AMBULATORY_CARE_PROVIDER_SITE_OTHER): Payer: Managed Care, Other (non HMO)

## 2014-11-14 ENCOUNTER — Encounter: Payer: Self-pay | Admitting: Sports Medicine

## 2014-11-14 VITALS — BP 110/67 | HR 77 | Wt 171.0 lb

## 2014-11-14 DIAGNOSIS — M25512 Pain in left shoulder: Secondary | ICD-10-CM | POA: Diagnosis not present

## 2014-11-14 NOTE — Assessment & Plan Note (Signed)
Ultrasound-guided arthrogram injection as above.

## 2014-11-14 NOTE — Progress Notes (Signed)
  Procedure: Real-time Ultrasound Guided gadolinium contrast injection of the left glenohumeral joint Device: GE Logiq E  Verbal informed consent obtained.  Time-out conducted.  Noted no overlying erythema, induration, or other signs of local infection.  Skin prepped in a sterile fashion.  Local anesthesia: Topical Ethyl chloride.  With sterile technique and under real time ultrasound guidance:  Spinal needle advanced into the joint taking care to avoid the labrum, 1 mL kenalog 40, 4 mL lidocaine injected easily, syringe switched and 0.1 mL dilute gadolinium injected, syringe again switched and 10 mL of sterile saline was used to flush the needle into the joint. Joint visualized and capsule seen distending confirming intra-articular placement of contrast material and medication. Completed without difficulty  Advised to call if fevers/chills, erythema, induration, drainage, or persistent bleeding.  Images permanently stored and available for review in the ultrasound unit.  Impression: Technically successful ultrasound guided gadolinium contrast injection for MR arthrography.  Please see separate MR arthrogram report.

## 2014-11-26 ENCOUNTER — Other Ambulatory Visit: Payer: Self-pay | Admitting: Family Medicine

## 2014-11-27 ENCOUNTER — Other Ambulatory Visit: Payer: Self-pay | Admitting: Family Medicine

## 2015-01-26 ENCOUNTER — Ambulatory Visit (INDEPENDENT_AMBULATORY_CARE_PROVIDER_SITE_OTHER): Payer: Managed Care, Other (non HMO) | Admitting: Neurology

## 2015-01-26 ENCOUNTER — Other Ambulatory Visit (INDEPENDENT_AMBULATORY_CARE_PROVIDER_SITE_OTHER): Payer: Managed Care, Other (non HMO)

## 2015-01-26 ENCOUNTER — Encounter: Payer: Self-pay | Admitting: Neurology

## 2015-01-26 VITALS — BP 100/68 | HR 76 | Ht 68.0 in | Wt 168.2 lb

## 2015-01-26 DIAGNOSIS — E538 Deficiency of other specified B group vitamins: Secondary | ICD-10-CM | POA: Diagnosis not present

## 2015-01-26 DIAGNOSIS — R202 Paresthesia of skin: Secondary | ICD-10-CM

## 2015-01-26 DIAGNOSIS — R252 Cramp and spasm: Secondary | ICD-10-CM

## 2015-01-26 LAB — VITAMIN B12: Vitamin B-12: 290 pg/mL (ref 211–911)

## 2015-01-26 MED ORDER — CYCLOBENZAPRINE HCL 5 MG PO TABS: 5.0000 mg | ORAL_TABLET | Freq: Every evening | ORAL | Status: DC | PRN

## 2015-01-26 NOTE — Patient Instructions (Addendum)
1.  Check vitamin B12 2.  Start flexiril 5mg  at bedtime as needed 3.  EMG, if symptoms worsen or do not improve 4.  Call my office for return visit

## 2015-01-26 NOTE — Progress Notes (Signed)
[facility] Clinic Note - Initial Visit   Date: 01/26/2015  ZARAI ORSBORN MRN: [id number] DOB: 28-Oct-1975   Dear Dr. Sarajane Jews:  Thank you for your kind referral of Laura [name] [facility] for consultation of muscle cramps. Although her history is well known to you, please allow [place] to reiterate it for the purpose of our medical record. The patient was accompanied to the clinic by self.   History of Present Illness: Laura [name] is a 39 y.o. right-handed Caucasian female with pituitary microadenoma (medically treated), IBS, hypothyroidism, and depression presenting for evaluation of muscle cramps.    Starting in 2014, she developed intermittent muscle cramps of the feet which has steadily worsened.  Cramps occur during the day and mostly at night.  There is no particular pattern.  Cramps can be triggered by muscle stretching.  She tried taking magnesium but this caused chest pain so stopped it.  No stiffness or cramps of the arm.  These can occur for 2-10 minutes and are painful. She comes with a very detailed day-to-day journal of her symptoms which will be scanned into the Epic chart.  She had numbness over the left arm and cool sensation of the upper arm. She reports having imbalance and fell once in [place].  She walks independently.  She also complains of random intermittent sharp pains in her left ear.  Of note, she was just found to by hypothyroid yesterday and will be starting levothyroxine.  Her last vitamin B12 was borderline low at 236. She had previously been vitamin B12 deficient and was on injections.   She saw Dr. Krista Blue at [facility] in 2012 for spells of syncope and headache.  MRI brian and EEG was normal.   Out-side paper records, electronic medical record, and images have been reviewed where available and summarized as:  Lab 06/29/2014:  TSH 4.29, CMP within normal limits MRI brain wwo contrast (pituitary protocol) 09/29/2013:  Previously visualized microadenoma  in the right sella is not clearly visualized.  MRI brain wo contrast 07/26/2014:  Normal EEG 08/07/2010:  Normal  Lab Results  Component Value Date   VITAMINB12 236 01/04/2014     Past Medical History  Diagnosis Date  . IBS (irritable bowel syndrome)     sees Dr. Eusebio Friendly in [place]   . Depression   . GERD (gastroesophageal reflux disease)   . Restless legs   . Insomnia   . Headache(784.0)   . UTI (lower urinary tract infection)     sees Dr Karsten Ro  . Anemia   . Anxiety   . Pituitary microadenoma with hyperprolactinemia     sees Dr. Mare [facility]    Past Surgical History  Procedure Laterality Date  . Tonsillectomy    . Abdominal hysterectomy  Nov 2008  . Neck surgery  JULY 1996    L side lymph node removal  . Wisdom tooth extraction    . Tubal ligation    . Esophagogastroduodenoscopy  08-01-14    per Dr. Cindee Salt, gastritis only   . Colonoscopy  08-01-14    per Dr. Cindee Salt, tubular adenoma, repeat in 5 yrs      Medications:  Outpatient Encounter Prescriptions as of 01/26/2015  Medication Sig Note  . buPROPion (WELLBUTRIN XL) 300 MG 24 hr tablet TAKE 1 TABLET BY MOUTH IN THE MORNING   . cabergoline (DOSTINEX) 0.5 MG tablet Take 0.25 mg by mouth 2 (two) times a week.    . cetirizine (ZYRTEC) 10 MG tablet Take  10 mg by mouth daily.   Marland Kitchen DEXILANT 60 MG capsule Take 1 tablet by mouth daily. 10/03/2014: Received from: External Pharmacy Received Sig:   . doxycycline (VIBRA-TABS) 100 MG tablet Take 100 mg by mouth daily.    . Fiber, Guar Gum, CHEW Chew by mouth. 01/26/2015: Received from: [place]  . ibuprofen (ADVIL,MOTRIN) 800 MG tablet Take 800 mg by mouth every 8 (eight) hours as needed.     Marland Kitchen levothyroxine (SYNTHROID, LEVOTHROID) 25 MCG tablet  01/26/2015: Received from: External Pharmacy  . polyethylene glycol (MIRALAX) packet Take 17 g by mouth. 01/26/2015: Received from: [place]  . Vitamin D, Ergocalciferol, (DRISDOL) 50000 UNITS CAPS capsule Take  50,000 Units by mouth. 01/26/2015: Received from: [place]  . [DISCONTINUED] cabergoline (DOSTINEX) 0.5 MG tablet Take 1/2 tablet twice a week 01/26/2015: Received from: [facility]  . [DISCONTINUED] docusate sodium (COLACE) 100 MG capsule Take 100 mg by mouth. 01/26/2015: Received from: [place]  . [DISCONTINUED] LINZESS 290 MCG CAPS capsule  01/26/2015: Received from: External Pharmacy  . [DISCONTINUED] methylPREDNISolone (MEDROL DOSEPAK) 4 MG TBPK tablet TAKE 1 TABLET(S) EVERY DAY BY ORAL ROUTE AS DIRECTED. 01/26/2015: Received from: External Pharmacy  . [DISCONTINUED] polyethylene glycol powder (GLYCOLAX/MIRALAX) powder Take 1 g by mouth See admin instructions. 10/03/2014: Received from: External Pharmacy Received Sig:   . [DISCONTINUED] Vitamin D, Ergocalciferol, (DRISDOL) 50000 UNITS CAPS capsule Take 1 capsule by mouth once a week. 10/03/2014: Received from: External Pharmacy Received Sig:   . [DISCONTINUED] Vitamin D, Ergocalciferol, (DRISDOL) 50000 UNITS CAPS capsule TAKE 1 CAPSULE (50,000 UNITS TOTAL) BY MOUTH EVERY 7 (SEVEN) DAYS.    No facility-administered encounter medications on file as of 01/26/2015.     Allergies:  Allergies  Allergen Reactions  . Magnesium Sulfate Shortness Of Breath  . Dilaudid [Hydromorphone Hcl]   . Hydromorphone Hcl   . Iohexol Other (See Comments)    Upper respiratory (sneezing) (IV contrast )  . Ivp Dye [Iodinated Diagnostic Agents]   . Penicillins   . Levofloxacin Rash    Family History: Family History  Problem Relation Age of Onset  . Alcohol abuse Father   . Coronary artery disease Maternal Grandmother     paternal grandmother  . Diabetes Maternal Grandfather   . Hypertension Mother   . Thyroid disease Mother     maternal grandmother  . Colon cancer Neg Hx   . Esophageal cancer Neg Hx   . Stomach cancer Neg Hx   . Aneurysm Paternal Grandfather     aortic  . Arrhythmia Mother     Social History: Social History  Substance Use  Topics  . Smoking status: Never Smoker   . Smokeless tobacco: Never Used  . Alcohol Use: No   Social History   Social History Narrative   Lives with husband and 2 children in a 2 story home.  Works as an Optometrist.  Education: MBA    Review of Systems:  CONSTITUTIONAL: No fevers, chills, night sweats, or weight loss.   EYES: No visual changes or eye pain ENT: No hearing changes.  No history of nose bleeds.   RESPIRATORY: No cough, wheezing and shortness of breath.   CARDIOVASCULAR: Negative for chest pain, and palpitations.   GI: Negative for abdominal discomfort, blood in stools or black stools.  No recent change in bowel habits.   GU:  No history of incontinence.   MUSCLOSKELETAL: No history of joint pain or swelling.  +myalgias.   SKIN: Negative for lesions, rash,  and itching.   HEMATOLOGY/ONCOLOGY: Negative for prolonged bleeding, bruising easily, and swollen nodes.  No history of cancer.   ENDOCRINE: Negative for cold or heat intolerance, polydipsia or goiter.   PSYCH:  +depression or anxiety symptoms.   NEURO: As Above.   Vital Signs:  BP 100/68 mmHg  Pulse 76  Ht 5\' 8"  (1.727 m)  Wt 168 lb 3 oz (76.289 kg)  BMI 25.58 kg/m2  SpO2 97%  General Medical Exam:   General:  Well appearing, comfortable.   Eyes/ENT: see cranial nerve examination.   Neck: No masses appreciated.  Full range of motion without tenderness.  No carotid bruits. Respiratory:  Clear to auscultation, good air entry bilaterally.   Cardiac:  Regular rate and rhythm, no murmur.   Extremities:  No deformities, edema, or skin discoloration.  Skin:  No rashes or lesions.  Neurological Exam: MENTAL STATUS including orientation to time, place, person, recent and remote memory, attention span and concentration, language, and fund of knowledge is normal.  Speech is not dysarthric.  CRANIAL NERVES: II:  No visual field defects.  Unremarkable fundi.   III-IV-VI: Pupils equal round and reactive to light.   Normal conjugate, extra-ocular eye movements in all directions of gaze.  No nystagmus.  No ptosis. V:  Normal facial sensation.    VII:  Normal facial symmetry and movements.  No pathologic facial reflexes.  VIII:  Normal hearing and vestibular function.   IX-X:  Normal palatal movement.   XI:  Normal shoulder shrug and head rotation.   XII:  Normal tongue strength and range of motion, no deviation or fasciculation.  MOTOR:  No atrophy, fasciculations or abnormal movements.  No pronator drift.  Tone is normal.  No grip or percussion myotonia.  Right Upper Extremity:    Left Upper Extremity:    Deltoid  5/5   Deltoid  5/5   Biceps  5/5   Biceps  5/5   Triceps  5/5   Triceps  5/5   Wrist extensors  5/5   Wrist extensors  5/5   Wrist flexors  5/5   Wrist flexors  5/5   Finger extensors  5/5   Finger extensors  5/5   Finger flexors  5/5   Finger flexors  5/5   Dorsal interossei  5/5   Dorsal interossei  5/5   Abductor pollicis  5/5   Abductor pollicis  5/5   Tone (Ashworth scale)  0  Tone (Ashworth scale)  0   Right Lower Extremity:    Left Lower Extremity:    Hip flexors  5/5   Hip flexors  5/5   Hip extensors  5/5   Hip extensors  5/5   Knee flexors  5/5   Knee flexors  5/5   Knee extensors  5/5   Knee extensors  5/5   Dorsiflexors  5/5   Dorsiflexors  5/5   Plantarflexors  5/5   Plantarflexors  5/5   Toe extensors  5/5   Toe extensors  5/5   Toe flexors  5/5   Toe flexors  5/5   Tone (Ashworth scale)  0  Tone (Ashworth scale)  0   MSRs:  Right  Left brachioradialis 2+  brachioradialis 2+  biceps 2+  biceps 2+  triceps 2+  triceps 2+  patellar 2+  patellar 2+  ankle jerk 2+  ankle jerk 2+  Hoffman no  Hoffman no  plantar response down  plantar response down   SENSORY:  Normal and symmetric perception of light touch, pinprick, vibration, and proprioception.  Romberg's sign absent.   COORDINATION/GAIT: Normal  finger-to- nose-finger and heel-to-shin.  Intact rapid alternating movements bilaterally.  Able to rise from a chair without using arms.  Gait narrow based and stable. Tandem and stressed gait intact.    IMPRESSION: 1.  Nocturnal leg cramps  - Not on any offending medications  - Possibly due to low thyroid  - No evidence of myotonia on exam 2.  Generalized paresthesias, worse on the left upper extremities  - Suspect due to low vitamin B12 and maybe very mild ulnar neuropathy 3.  Pituitary microadenoma, followed by endocrinology 4.  Low vitamin B12 5.  Newly diagnosed hypothyroidism  PLAN/RECOMMENDATIONS:  1.  Check vitamin B12 2.  Start flexiril 5mg  at bedtime as needed 3.  EMG of the left arm and leg, if symptoms worsen 4.  Return to clinic as needed   The duration of this appointment visit was 40 minutes of face-to-face time with the patient.  Greater than 50% of this time was spent in counseling, explanation of diagnosis, planning of further management, and coordination of care.   Thank you for allowing me to participate in patient's care.  If I can answer any additional questions, I would be pleased to do so.    Sincerely,    Donika K. Posey Pronto, DO

## 2015-02-07 ENCOUNTER — Ambulatory Visit (INDEPENDENT_AMBULATORY_CARE_PROVIDER_SITE_OTHER): Payer: Managed Care, Other (non HMO)

## 2015-02-07 ENCOUNTER — Ambulatory Visit (INDEPENDENT_AMBULATORY_CARE_PROVIDER_SITE_OTHER): Payer: Managed Care, Other (non HMO) | Admitting: Sports Medicine

## 2015-02-07 ENCOUNTER — Encounter: Payer: Self-pay | Admitting: Sports Medicine

## 2015-02-07 VITALS — BP 117/71 | HR 81 | Ht 68.0 in | Wt 167.0 lb

## 2015-02-07 DIAGNOSIS — M25552 Pain in left hip: Secondary | ICD-10-CM | POA: Diagnosis not present

## 2015-02-07 DIAGNOSIS — M169 Osteoarthritis of hip, unspecified: Secondary | ICD-10-CM

## 2015-02-07 DIAGNOSIS — M24159 Other articular cartilage disorders, unspecified hip: Secondary | ICD-10-CM | POA: Insufficient documentation

## 2015-02-07 NOTE — Assessment & Plan Note (Signed)
Some trochanteric bursa symptoms but predominantly anterior labral signs with a strongly positive flexion adduction internal rotation test. Femoral acetabular joint injection as above, she will also need an MR arthrogram that is going to need to be done at a later date. Formal physical therapy. Return to see me for arthrogram injection.

## 2015-02-07 NOTE — Progress Notes (Signed)
   Subjective:    I'm seeing this patient as a consultation for:  Dr. Sarajane Jews  CC: left hip pain  HPI: For decades this pleasant 39 year old female has had pain that she localizes in the left groin, and left lateral hip, worse with flexion and abduction of the hip, which makes it difficult for her to have relations with her husband. Pain is moderate, persistent with occasional popping mechanical symptoms. No radiation.  Past medical history, Surgical history, Family history not pertinant except as noted below, Social history, Allergies, and medications have been entered into the medical record, reviewed, and no changes needed.   Review of Systems: No headache, visual changes, nausea, vomiting, diarrhea, constipation, dizziness, abdominal pain, skin rash, fevers, chills, night sweats, weight loss, swollen lymph nodes, body aches, joint swelling, muscle aches, chest pain, shortness of breath, mood changes, visual or auditory hallucinations.   Objective:   General: Well Developed, well nourished, and in no acute distress.  Neuro/Psych: Alert and oriented x3, extra-ocular muscles intact, able to move all 4 extremities, sensation grossly intact. Skin: Warm and dry, no rashes noted.  Respiratory: Not using accessory muscles, speaking in full sentences, trachea midline.  Cardiovascular: Pulses palpable, no extremity edema. Abdomen: Does not appear distended. leftHip: ROM IR: 60 Deg, ER: 60 Deg, Flexion: 120 Deg, Extension: 100 Deg, Abduction: 45 Deg, Adduction: 45 Deg Strength IR: 5/5, ER: 5/5, Flexion: 5/5, Extension: 5/5, Abduction: 5/5, Adduction: 5/5 Positive flexion, adduction, internal rotation test with exquisite pain. Pelvic alignment unremarkable to inspection and palpation. Standing hip rotation and gait without trendelenburg / unsteadiness. Greater trochanter without tenderness to palpation. No tenderness over piriformis. No SI joint tenderness and normal minimal SI  movement.  Procedure: Real-time Ultrasound Guided Injection of left femoral acetabular joint Device: GE Logiq E  Verbal informed consent obtained.  Time-out conducted.  Noted no overlying erythema, induration, or other signs of local infection.  Skin prepped in a sterile fashion.  Local anesthesia: Topical Ethyl chloride.  With sterile technique and under real time ultrasound guidance:  Spinal needle advanced to the femoral acetabular joint, spinal needle felt to injure the capsule, I contacted the femoral neck withdrawn slightly and injected 2 mL kenalog 40, 4 mL lidocaine easily. Completed without difficulty  Pain immediately resolved suggesting accurate placement of the medication.  Advised to call if fevers/chills, erythema, induration, drainage, or persistent bleeding.  Images permanently stored and available for review in the ultrasound unit.  Impression: Technically successful ultrasound guided injection.  Impression and Recommendations:   This case required medical decision making of moderate complexity.

## 2015-02-14 ENCOUNTER — Ambulatory Visit (INDEPENDENT_AMBULATORY_CARE_PROVIDER_SITE_OTHER): Payer: Managed Care, Other (non HMO) | Admitting: Sports Medicine

## 2015-02-14 ENCOUNTER — Ambulatory Visit (INDEPENDENT_AMBULATORY_CARE_PROVIDER_SITE_OTHER): Payer: Managed Care, Other (non HMO)

## 2015-02-14 VITALS — BP 102/67 | HR 78 | Ht 68.0 in | Wt 166.0 lb

## 2015-02-14 DIAGNOSIS — X58XXXA Exposure to other specified factors, initial encounter: Secondary | ICD-10-CM | POA: Diagnosis not present

## 2015-02-14 DIAGNOSIS — M25552 Pain in left hip: Secondary | ICD-10-CM

## 2015-02-14 DIAGNOSIS — M24159 Other articular cartilage disorders, unspecified hip: Secondary | ICD-10-CM

## 2015-02-14 DIAGNOSIS — S73192A Other sprain of left hip, initial encounter: Secondary | ICD-10-CM | POA: Diagnosis not present

## 2015-02-14 DIAGNOSIS — M169 Osteoarthritis of hip, unspecified: Principal | ICD-10-CM

## 2015-02-14 NOTE — Assessment & Plan Note (Signed)
Ultrasound-guided arthrogram injection, please see MRI report.

## 2015-02-14 NOTE — Progress Notes (Signed)
  Procedure: Real-time Ultrasound Guided gadolinium contrast injection of left femoral acetabular joint Device: GE Logiq E  Verbal informed consent obtained.  Time-out conducted.  Noted no overlying erythema, induration, or other signs of local infection.  Skin prepped in a sterile fashion.  Local anesthesia: Topical Ethyl chloride.  With sterile technique and under real time ultrasound guidance:  I advanced the spinal towards the femoral head/neck junction, Pierce the capsule, 1 mL kenalog 40, 5 mL lidocaine injected easily, syringe switched and 0.1 mL gadolinium injected easily, syringe again switched and 10 mL sterile saline injected. Joint visualized and capsule seen distending confirming intra-articular placement of contrast material and medication. Completed without difficulty  Advised to call if fevers/chills, erythema, induration, drainage, or persistent bleeding.  Images permanently stored and available for review in the ultrasound unit.  Impression: Technically successful ultrasound guided gadolinium contrast injection for MR arthrography.  Please see separate MR arthrogram report.

## 2015-02-15 ENCOUNTER — Ambulatory Visit: Payer: Managed Care, Other (non HMO) | Admitting: Physical Therapy

## 2015-02-15 ENCOUNTER — Other Ambulatory Visit: Payer: Self-pay | Admitting: *Deleted

## 2015-02-15 DIAGNOSIS — M5412 Radiculopathy, cervical region: Secondary | ICD-10-CM

## 2015-02-15 MED ORDER — MELOXICAM 15 MG PO TABS: ORAL_TABLET | ORAL | Status: DC

## 2015-02-22 ENCOUNTER — Ambulatory Visit: Payer: Managed Care, Other (non HMO) | Admitting: Physical Therapy

## 2015-04-20 ENCOUNTER — Ambulatory Visit (INDEPENDENT_AMBULATORY_CARE_PROVIDER_SITE_OTHER): Payer: Managed Care, Other (non HMO) | Admitting: Sports Medicine

## 2015-04-20 ENCOUNTER — Encounter: Payer: Self-pay | Admitting: Sports Medicine

## 2015-04-20 VITALS — BP 110/57 | HR 72 | Ht 68.0 in | Wt 170.0 lb

## 2015-04-20 DIAGNOSIS — G5603 Carpal tunnel syndrome, bilateral upper limbs: Secondary | ICD-10-CM | POA: Diagnosis not present

## 2015-04-20 DIAGNOSIS — G5600 Carpal tunnel syndrome, unspecified upper limb: Secondary | ICD-10-CM | POA: Insufficient documentation

## 2015-04-20 DIAGNOSIS — M25512 Pain in left shoulder: Secondary | ICD-10-CM

## 2015-04-20 MED ORDER — AMITRIPTYLINE HCL 25 MG PO TABS: ORAL_TABLET | ORAL | Status: DC

## 2015-04-20 NOTE — Assessment & Plan Note (Signed)
Persistent impingement type pain, negative MRI with the exception of an abnormalities that appears to be a split tear and biceps tendon however negative biceps testing. At this point she has failed months of physical therapy, NSAIDs, 2 subacromial injections and glenohumeral injection, all under ultrasound guidance. She is a candidate now for a diagnostic arthroscopy. Would like her to touch base with [facility].

## 2015-04-20 NOTE — Progress Notes (Signed)
Subjective:    CC: "shoulder pain"  HPI: Patient presents with return of her left shoulder pain after previously receiving a steroid injection of her rotator cuff on the left side. She is unsure how long the pain relief lasted after the last shot but is sure that it helped. She notices the pain most when reaching for her alarm clock or brushing her hair.  Patient also says that she has been having finger tip numbness bilaterally that is sometimes painful. Her 3rd finger on her left hand and her 3rd and 4th fingertips go numb occasionally. Only her left 3rd finger tip is numb at this moment. She does not notice a distinct line of color change but does say that the cold can sometimes bring on the numbness. She denies any hand weakness or shooting pains from her neck. She endorses neck "tightness" from sitting but does not endorse defined neck pain.  Patient does become tearful when asked about her mood. Her brother passed away around 1 month ago and she says that the passing has taken a toll on her.  Past medical history, Surgical history, Family history not pertinant except as noted below, Social history, Allergies, and medications have been entered into the medical record, reviewed, and no changes needed.   Review of Systems: No fevers, chills, night sweats, weight loss, chest pain, or shortness of breath.   Objective:    General: Well Developed, well nourished, and in no acute distress.  Neuro: Alert and oriented x3, extra-ocular muscles intact, sensation grossly intact.  HEENT: Normocephalic, atraumatic, pupils equal round reactive to light, neck supple, no masses, no lymphadenopathy, thyroid nonpalpable.  Skin: Warm and dry, no rashes. Cardiac: Regular rate and rhythm, no murmurs rubs or gallops, no lower extremity edema.  Respiratory: Clear to auscultation bilaterally. Not using accessory muscles, speaking in full sentences. Left Shoulder: Inspection reveals no abnormalities, atrophy or  asymmetry. Palpation is tender over the joint line with no obvious deformity. No tenderness over AC joint or bicipital groove. ROM is full in all planes but movement does bring on pain. Rotator cuff strength normal throughout. Positive signs of impingement with positive Hawkin's test and empty can sign. Speeds and Yergason's tests normal. No labral pathology noted with negative Obrien's, negative clunk and good stability. Normal scapular function observed. No painful arc and no drop arm sign. No apprehension sign Bilateral Wrist: Inspection normal with no visible erythema or swelling. ROM smooth and normal with good flexion and extension and ulnar/radial deviation that is symmetrical with opposite wrist. Palpation is normal over metacarpals, navicular, lunate, and TFCC; tendons without tenderness/ swelling No snuffbox tenderness. No tenderness over Canal of Guyon. Strength 5/5 in all directions without pain. Positive Tinel sign bilaterally. Negative Watson's test. Patient endorses numbness over left 3rd finger distal to the DIP joint. No numbness currently over the right 3rd and 4th fingers.   Impression and Recommendations:    L Shoulder pain Patient presents with ongoing left shoulder pain that began after a fall who is now s/p steroid injections into the rotator cuff with temporary relief. Patient's story and exam are consistent with rotator cuff pathology despite a normal MRI. Referred to ortho for evaluation for arthroscopy. Patient has seen them for hip pain in the past and was unsure about receiving arthroscopy of the hip due to concerns about being temporarily non ambulatory. She seems more amenable to the shoulder arthroscopy.  Bilateral finger numbness Patient's presentation is most likely carpal tunnel syndrome given positive Tinel sign  and numbness confined within the median nerve distribution. No pathology noted on C spine x-ray except for loss of lordosis that is most likely  due to muscle tightness/spasm. Patient advised to splint both wrists at night for one month and return to clinic if symptoms persist for possible median nerve hydro-dissection. Also adding amitryptaline 12.5 mg nightly for 1 week and then titrating up to 25 mg nightly.

## 2015-04-20 NOTE — Assessment & Plan Note (Signed)
Needs to restart bilateral nighttime wrist splinting. She did have a strongly positive bilateral Tinel's sign. Return if no better in one month and we will proceed with hydrodissection. Also going to add some amitriptyline at bedtime.

## 2015-05-23 ENCOUNTER — Ambulatory Visit: Payer: Managed Care, Other (non HMO) | Admitting: Sports Medicine

## 2015-05-30 ENCOUNTER — Encounter: Payer: Self-pay | Admitting: Sports Medicine

## 2015-05-30 ENCOUNTER — Ambulatory Visit (INDEPENDENT_AMBULATORY_CARE_PROVIDER_SITE_OTHER): Payer: Managed Care, Other (non HMO) | Admitting: Sports Medicine

## 2015-05-30 VITALS — BP 106/71 | HR 68 | Wt 170.0 lb

## 2015-05-30 DIAGNOSIS — G5603 Carpal tunnel syndrome, bilateral upper limbs: Secondary | ICD-10-CM | POA: Diagnosis not present

## 2015-05-30 DIAGNOSIS — J0100 Acute maxillary sinusitis, unspecified: Secondary | ICD-10-CM | POA: Diagnosis not present

## 2015-05-30 MED ORDER — TRIAMCINOLONE ACETONIDE 40 MG/ML IJ SUSP
40.0000 mg | Freq: Once | INTRAMUSCULAR | Status: AC
  Administered 2015-05-30: 40 mg via INTRA_ARTICULAR

## 2015-05-30 MED ORDER — FLUTICASONE PROPIONATE 50 MCG/ACT NA SUSP
NASAL | Status: DC
Start: 2015-05-30 — End: 2015-06-22

## 2015-05-30 NOTE — Progress Notes (Signed)
  Subjective:    CC: follow-up  HPI: Carpal tunnel syndrome: Bilateral, we tried bilateral nighttime splinting which only provided minimal improvement. Pain is continues to be moderate, persistent, worst on the left. She is agreeable to try a hydrodissection procedure today.  Running nose: With mild sore throat, sinus pressure, and ear pressure,understands that this is viral, needs some advice.  Past medical history, Surgical history, Family history not pertinant except as noted below, Social history, Allergies, and medications have been entered into the medical record, reviewed, and no changes needed.   Review of Systems: No fevers, chills, night sweats, weight loss, chest pain, or shortness of breath.   Objective:    General: Well Developed, well nourished, and in no acute distress.  Neuro: Alert and oriented x3, extra-ocular muscles intact, sensation grossly intact.  HEENT: Normocephalic, atraumatic, pupils equal round reactive to light, neck supple, no masses, no lymphadenopathy, thyroid nonpalpable. Oropharynx shows postnasal drip syndrome, nasopharynx and ear canals are unremarkable. Skin: Warm and dry, no rashes. Cardiac: Regular rate and rhythm, no murmurs rubs or gallops, no lower extremity edema.  Respiratory: Clear to auscultation bilaterally. Not using accessory muscles, speaking in full sentences.  Procedure: Real-time Ultrasound Guided Injection of left median nerve hydrodissection Device: GE Logiq E  Verbal informed consent obtained.  Time-out conducted.  Noted no overlying erythema, induration, or other signs of local infection.  Skin prepped in a sterile fashion.  Local anesthesia: Topical Ethyl chloride.  With sterile technique and under real time ultrasound guidance:  Using a 25-gauge needle I injected medicine both superficial to and deep to the median nerve in the carpal tunnel, needle was redirected and medication injected deep into the carpal tunnel around the  tendons, a total of 1 mL kenalog 40, 5 mL lidocaine was used. Completed without difficulty  Pain immediately resolved suggesting accurate placement of the medication.  Advised to call if fevers/chills, erythema, induration, drainage, or persistent bleeding.  Images permanently stored and available for review in the ultrasound unit.  Impression: Technically successful ultrasound guided injection.  Impression and Recommendations:

## 2015-05-30 NOTE — Assessment & Plan Note (Signed)
Bilateral, insufficient response to nighttime splinting. Left-sided median nerve hydrodissection as above, return in one month consider right side.

## 2015-05-30 NOTE — Addendum Note (Signed)
Addended by: Laurey Arrow on: 05/30/2015 01:05 PM   Modules accepted: Orders

## 2015-05-30 NOTE — Assessment & Plan Note (Signed)
With eustachian tube dysfunction. Flonase, decongestants.

## 2015-06-21 ENCOUNTER — Telehealth: Payer: Self-pay | Admitting: Family Medicine

## 2015-06-21 NOTE — Telephone Encounter (Signed)
Pt scheduled to see pcp on Friday (06/23/15) at 11:15 for rash.  Patient states she went to see nurse at her job and the nurse thinks she has shingles.  Patient called back to see if she could get an appt earlier than Friday.  I advised pt no appt available with Dr. Sarajane Jews today or tomorrow and offered patient to come in to see another [place] provider, pt declined stating she would prefer to see pcp.  Pt requesting to be worked in before Friday if possible, if not patient states she will wait to see Dr. Sarajane Jews on Friday as scheduled.

## 2015-06-21 NOTE — Telephone Encounter (Signed)
Patient scheduled to see Dr. Sarajane Jews tomorrow at 94:15

## 2015-06-22 ENCOUNTER — Ambulatory Visit (INDEPENDENT_AMBULATORY_CARE_PROVIDER_SITE_OTHER): Payer: Managed Care, Other (non HMO) | Admitting: Family Medicine

## 2015-06-22 ENCOUNTER — Encounter: Payer: Self-pay | Admitting: Family Medicine

## 2015-06-22 VITALS — BP 98/70 | HR 76 | Temp 98.3°F | Ht 68.0 in | Wt 166.0 lb

## 2015-06-22 DIAGNOSIS — B029 Zoster without complications: Secondary | ICD-10-CM | POA: Diagnosis not present

## 2015-06-22 MED ORDER — VALACYCLOVIR HCL 1 G PO TABS: 1000.0000 mg | ORAL_TABLET | Freq: Three times a day (TID) | ORAL | Status: DC

## 2015-06-22 MED ORDER — METHYLPREDNISOLONE 4 MG PO TBPK: ORAL_TABLET | ORAL | Status: DC

## 2015-06-22 NOTE — Progress Notes (Signed)
   Subjective:    Patient ID: Laura [name], female    DOB: 11/19/1975, 40 y.o.   MRN: [id number]  HPI Here for 5 days of a mild burning pain and itching on the left flank, and now for 2 days she has had a rash there.    Review of Systems  Constitutional: Negative.   Skin: Positive for rash.       Objective:   Physical Exam  Constitutional: She appears well-developed and well-nourished.  Skin:  Patch of red vesicles on the left flank           Assessment & Plan:  Shingles. Treat with Valtrex and a Medrol dose pack

## 2015-06-22 NOTE — Progress Notes (Signed)
Pre visit review using our clinic review tool, if applicable. No additional management support is needed unless otherwise documented below in the visit note. 

## 2015-06-23 ENCOUNTER — Ambulatory Visit: Payer: Managed Care, Other (non HMO) | Admitting: Family Medicine

## 2015-06-27 ENCOUNTER — Ambulatory Visit: Payer: Managed Care, Other (non HMO) | Admitting: Sports Medicine

## 2015-06-28 ENCOUNTER — Encounter: Payer: Self-pay | Admitting: *Deleted

## 2015-06-28 ENCOUNTER — Emergency Department
Admission: EM | Admit: 2015-06-28 | Discharge: 2015-06-28 | Disposition: A | Discharge status: Home / Self Care | Payer: Managed Care, Other (non HMO) | Source: Home / Self Care | Attending: Family Medicine | Admitting: Family Medicine

## 2015-06-28 DIAGNOSIS — Z20828 Contact with and (suspected) exposure to other viral communicable diseases: Secondary | ICD-10-CM

## 2015-06-28 DIAGNOSIS — R6889 Other general symptoms and signs: Secondary | ICD-10-CM | POA: Diagnosis not present

## 2015-06-28 MED ORDER — OSELTAMIVIR PHOSPHATE 75 MG PO CAPS: 75.0000 mg | ORAL_CAPSULE | Freq: Two times a day (BID) | ORAL | Status: DC

## 2015-06-28 NOTE — ED Provider Notes (Signed)
CSN: [id number]     Arrival date & time 06/28/15  1600 History   First MD Initiated Contact with Patient 06/28/15 1620     Chief Complaint  Patient presents with  . Cough   (Consider location/radiation/quality/duration/timing/severity/associated sxs/prior Treatment) HPI  Pt is a 40yo female presenting to [facility] with c/o nonproductive cough that started 2 days ago, associated subjective low grade fever today with body aches.  Her husband tested positive for the flu 3 days ago.  She has been taking tylenol with minimal relief.  Symptoms are moderate in severity. Denies n/v/d. Denies SOB or hx of asthma.  She notes she has been on a Medrol Dosepak and Valacyclovir for shingles for about 1 week.  Rash appears to be improving per pt.    Past Medical History  Diagnosis Date  . IBS (irritable bowel syndrome)     sees Dr. Eusebio Friendly in [place]   . Depression   . GERD (gastroesophageal reflux disease)   . Restless legs   . Insomnia   . Headache(784.0)   . UTI (lower urinary tract infection)     sees Dr Karsten Ro  . Anemia   . Anxiety   . Pituitary microadenoma with hyperprolactinemia [facility])     sees Dr. Mare Ferrari in [facility]   Past Surgical History  Procedure Laterality Date  . Tonsillectomy    . Abdominal hysterectomy  Nov 2008  . Neck surgery  JULY 1996    L side lymph node removal  . Wisdom tooth extraction    . Tubal ligation    . Esophagogastroduodenoscopy  08-01-14    per Dr. Cindee Salt, gastritis only   . Colonoscopy  08-01-14    per Dr. Cindee Salt, tubular adenoma, repeat in 5 yrs    Family History  Problem Relation Age of Onset  . Alcohol abuse Father   . Coronary artery disease Maternal Grandmother     paternal grandmother  . Diabetes Maternal Grandfather   . Hypertension Mother   . Thyroid disease Mother     maternal grandmother  . Colon cancer Neg Hx   . Esophageal cancer Neg Hx   . Stomach cancer Neg Hx   . Aneurysm Paternal Grandfather     aortic  . Arrhythmia Mother    . Brain cancer Brother   . Hypertension Father   . Hypertension Maternal Grandmother   . Hypertension Maternal Grandfather    Social History  Substance Use Topics  . Smoking status: Never Smoker   . Smokeless tobacco: Never Used  . Alcohol Use: No   OB History    No data available     Review of Systems  Constitutional: Positive for fever, chills, appetite change and fatigue.  HENT: Positive for congestion, ear pain, rhinorrhea, sinus pressure, sore throat and voice change. Negative for trouble swallowing.   Respiratory: Positive for cough. Negative for shortness of breath.   Cardiovascular: Negative for chest pain and palpitations.  Gastrointestinal: Positive for nausea. Negative for vomiting, abdominal pain and diarrhea.  Musculoskeletal: Positive for myalgias and arthralgias. Negative for back pain.  Skin: Negative for rash.  Neurological: Positive for dizziness and headaches. Negative for light-headedness.    Allergies  Magnesium sulfate; Dilaudid; Hydromorphone hcl; Iohexol; Ivp dye; Penicillins; and Levofloxacin  Home Medications   Prior to Admission medications   Medication Sig Start Date End Date Taking? Authorizing Provider  buPROPion (WELLBUTRIN XL) 300 MG 24 hr tablet TAKE 1 TABLET BY MOUTH IN THE MORNING 11/29/14   Laurey Morale,  MD  cabergoline (DOSTINEX) 0.5 MG tablet Take 0.25 mg by mouth 2 (two) times a week.     Historical Provider, MD  cetirizine (ZYRTEC) 10 MG tablet Take 10 mg by mouth daily.    Historical Provider, MD  DEXILANT 60 MG capsule Take 1 tablet by mouth daily. 09/09/14   Historical Provider, MD  dicyclomine (BENTYL) 10 MG capsule TAKE 1-2 CAPSULES BY MOUTH EVERY EIGHT HOURS AS NEEDED FOR PAIN 05/10/15   Historical Provider, MD  levothyroxine (SYNTHROID, LEVOTHROID) 50 MCG tablet TAKE 1 TABLET A DAY ON AN EMPTY STOMACH 45 MINUTES BEFORE FOOD OR OTHER MEDICATIONS 04/25/15   Historical Provider, MD  loteprednol (LOTEMAX) 0.5 % ophthalmic suspension  PUT 1 DROP INTO BOTH EYES AS DIRECTED 04/19/15   Historical Provider, MD  methylPREDNISolone (MEDROL DOSEPAK) 4 MG TBPK tablet As directed 06/22/15   Laurey Morale, MD  oseltamivir (TAMIFLU) 75 MG capsule Take 1 capsule (75 mg total) by mouth every 12 (twelve) hours. 06/28/15   Noland Fordyce, PA-C  polyethylene glycol [facility]) packet Take 17 g by mouth.    Historical Provider, MD  valACYclovir (VALTREX) 1000 MG tablet Take 1 tablet (1,000 mg total) by mouth 3 (three) times daily. 06/22/15   Laurey Morale, MD  Vitamin D, Ergocalciferol, (DRISDOL) 50000 UNITS CAPS capsule Take 50,000 Units by mouth. 09/24/13   Historical Provider, MD   Meds Ordered and Administered this Visit  Medications - No data to display  BP 105/66 mmHg  Pulse 90  Temp(Src) 98.4 F (36.9 C) (Oral)  Resp 16  Ht 5\' 8"  (1.727 m)  Wt 163 lb (73.936 kg)  BMI 24.79 kg/m2  SpO2 97% No data found.   Physical Exam  Constitutional: She appears well-developed and well-nourished. No distress.  HENT:  Head: Normocephalic and atraumatic.  Right Ear: Hearing, tympanic membrane, external ear and ear canal normal.  Left Ear: Hearing, tympanic membrane, external ear and ear canal normal.  Nose: Mucosal edema present.  Mouth/Throat: Uvula is midline and mucous membranes are normal. Posterior oropharyngeal erythema present. No oropharyngeal exudate, posterior oropharyngeal edema or tonsillar abscesses.  Eyes: Conjunctivae are normal. No scleral icterus.  Neck: Normal range of motion. Neck supple.  Cardiovascular: Normal rate, regular rhythm and normal heart sounds.   Pulmonary/Chest: Effort normal and breath sounds normal. No respiratory distress. She has no wheezes. She has no rales. She exhibits no tenderness.  Abdominal: Soft. She exhibits no distension and no mass. There is no tenderness. There is no rebound and no guarding.  Musculoskeletal: Normal range of motion.  Neurological: She is alert.  Skin: Skin is warm and dry. Rash  noted. She is not diaphoretic. There is erythema.  Well healing vesicular rash on Left flank. No bleeding or discharge. Mildly tender to touch.  Nursing note and vitals reviewed.   ED Course  Procedures (including critical care time)  Labs Review Labs Reviewed - No data to display  Imaging Review No results found.    MDM   1. Flu-like symptoms   2. Exposure to the flu     Pt presenting to [facility] with c/o flu-like symptoms that started 2 days ago, 1 day after her husband was dx with the flu. She has also been on Medrol Dosepak and Valtrex for shingles. Shingles rash appears to be healing well.  Due to known exposure, will tx pt empirically for influenza.  Discussed side effects/ benefits of Tamiflu.  Rx: Tamiflu.    Advised pt to use acetaminophen and  ibuprofen as needed for fever and pain. Encouraged rest and fluids. F/u with PCP in 1 week if not improving, sooner if worsening. Pt verbalized understanding and agreement with tx plan.   Noland Fordyce, PA-C 06/28/15 1725

## 2015-06-28 NOTE — ED Notes (Signed)
Pt c/o nonproductive cough x Monday night, with low grade fever today and body aches. Husband with positive flu test Sunday. She took Tylenol this morning.

## 2015-06-28 NOTE — Discharge Instructions (Signed)
You may take 400-600mg  Ibuprofen (Motrin) every 6-8 hours for fever and pain  Alternate with Tylenol  You may take 500mg  Tylenol every 4-6 hours as needed for fever and pain  Follow-up with your primary care provider next week for recheck of symptoms if not improving.  Be sure to drink plenty of fluids and rest, at least 8hrs of sleep a night, preferably more while you are sick. Return urgent care or go to closest ER if you cannot keep down fluids/signs of dehydration, fever not reducing with Tylenol, difficulty breathing/wheezing, stiff neck, worsening condition, or other concerns (see below)  Please take Tamiflu as prescribed and be sure to complete entire course even if you start to feel better to ensure infection does not come back.

## 2015-07-02 NOTE — ED Notes (Signed)
Inquired about patient's status; encourage them to call with questions/concerns.  

## 2015-07-28 ENCOUNTER — Ambulatory Visit (INDEPENDENT_AMBULATORY_CARE_PROVIDER_SITE_OTHER): Payer: Managed Care, Other (non HMO) | Admitting: Sports Medicine

## 2015-07-28 ENCOUNTER — Encounter: Payer: Self-pay | Admitting: Sports Medicine

## 2015-07-28 VITALS — BP 120/77 | HR 76 | Resp 16 | Wt 164.5 lb

## 2015-07-28 DIAGNOSIS — M2550 Pain in unspecified joint: Secondary | ICD-10-CM

## 2015-07-28 DIAGNOSIS — M797 Fibromyalgia: Secondary | ICD-10-CM | POA: Insufficient documentation

## 2015-07-28 MED ORDER — PREGABALIN 75 MG PO CAPS: 75.0000 mg | ORAL_CAPSULE | Freq: Two times a day (BID) | ORAL | Status: DC

## 2015-07-28 NOTE — Assessment & Plan Note (Addendum)
Has had uneventful visits with the neurologist, as well as orthopedic surgeon. This likely represents myofascial pain syndrome, adding a multitude of rheumatologic tests, she does have an appointment with rheumatology in March, and will take her lab work there with her. Adding Lyrica. Return to see me in one month.  Mildly positive ANA, adding lupus panel.

## 2015-07-28 NOTE — Progress Notes (Signed)
  Subjective:    CC: Widespread pain  HPI: For weeks this pleasant 40 year old female has had pain that she localizes on both hips, thighs, shoulders, back, is different from her prior hip labral pain, she did see her orthopedic surgeon who suggested nonoperative management. She has also seen neurologist, who referred her to a rheumatologist. Pain is widespread, moderate, persistent, without radiation.  Past medical history, Surgical history, Family history not pertinant except as noted below, Social history, Allergies, and medications have been entered into the medical record, reviewed, and no changes needed.   Review of Systems: No fevers, chills, night sweats, weight loss, chest pain, or shortness of breath.   Objective:    General: Well Developed, well nourished, and in no acute distress.  Neuro: Alert and oriented x3, extra-ocular muscles intact, sensation grossly intact.  HEENT: Normocephalic, atraumatic, pupils equal round reactive to light, neck supple, no masses, no lymphadenopathy, thyroid nonpalpable.  Skin: Warm and dry, no rashes. Cardiac: Regular rate and rhythm, no murmurs rubs or gallops, no lower extremity edema.  Respiratory: Clear to auscultation bilaterally. Not using accessory muscles, speaking in full sentences.  Impression and Recommendations:   I spent 25 minutes with this patient, greater than 50% was face-to-face time counseling regarding the above diagnoses

## 2015-07-29 LAB — URIC ACID: Uric Acid, Serum: 3.8 mg/dL (ref 2.4–7.0)

## 2015-07-29 LAB — CBC WITH DIFFERENTIAL/PLATELET
Basophils Absolute: 0 10*3/uL (ref 0.0–0.1)
Basophils Relative: 1 % (ref 0–1)
Eosinophils Absolute: 0 K/uL (ref 0.0–0.7)
Eosinophils Relative: 1 % (ref 0–5)
HCT: 38.4 % (ref 36.0–46.0)
Hemoglobin: 12.7 g/dL (ref 12.0–15.0)
Lymphocytes Relative: 33 % (ref 12–46)
Lymphs Abs: 1 K/uL (ref 0.7–4.0)
MCH: 30.8 pg (ref 26.0–34.0)
MCHC: 33.1 g/dL (ref 30.0–36.0)
MCV: 93.2 fL (ref 78.0–100.0)
MPV: 10.1 fL (ref 8.6–12.4)
Monocytes Absolute: 0.4 10*3/uL (ref 0.1–1.0)
Monocytes Relative: 12 % (ref 3–12)
Neutro Abs: 1.6 10*3/uL — ABNORMAL LOW (ref 1.7–7.7)
Neutrophils Relative %: 53 % (ref 43–77)
Platelets: 184 10*3/uL (ref 150–400)
RBC: 4.12 MIL/uL (ref 3.87–5.11)
RDW: 14 % (ref 11.5–15.5)
WBC: 3.1 10*3/uL — ABNORMAL LOW (ref 4.0–10.5)

## 2015-07-29 LAB — COMPREHENSIVE METABOLIC PANEL
ALT: 7 U/L (ref 6–29)
AST: 12 U/L (ref 10–30)
Alkaline Phosphatase: 55 U/L (ref 33–115)
BUN: 10 mg/dL (ref 7–25)
CO2: 27 mmol/L (ref 20–31)
Creat: 0.81 mg/dL (ref 0.50–1.10)
Glucose, Bld: 80 mg/dL (ref 65–99)
Sodium: 138 mmol/L (ref 135–146)
Total Bilirubin: 0.4 mg/dL (ref 0.2–1.2)

## 2015-07-29 LAB — RHEUMATOID FACTOR: Rheumatoid fact SerPl-aCnc: <10 [IU]/mL (ref <=14)

## 2015-07-29 LAB — COMPREHENSIVE METABOLIC PANEL WITH GFR
Albumin: 4.1 g/dL (ref 3.6–5.1)
Calcium: 8.9 mg/dL (ref 8.6–10.2)
Chloride: 105 mmol/L (ref 98–110)
Potassium: 4.3 mmol/L (ref 3.5–5.3)
Total Protein: 6.4 g/dL (ref 6.1–8.1)

## 2015-07-29 LAB — CK: Total CK: 29 U/L (ref 7–177)

## 2015-07-29 LAB — SEDIMENTATION RATE: Sed Rate: 1 mm/h (ref 0–20)

## 2015-07-31 LAB — ANA: Anti Nuclear Antibody(ANA): POSITIVE — AB

## 2015-07-31 LAB — EHRLICHIA ANTIBODY PANEL
E chaffeensis (HGE) Ab, IgG: <1:64 {titer}
E chaffeensis (HGE) Ab, IgM: <1:20 {titer}

## 2015-07-31 LAB — ANTI-NUCLEAR AB-TITER (ANA TITER): ANA Titer 1: 1:40 {titer} — ABNORMAL HIGH

## 2015-07-31 LAB — LYME AB/WESTERN BLOT REFLEX: B burgdorferi Ab IgG+IgM: 0.15 {ISR}

## 2015-07-31 LAB — CYCLIC CITRUL PEPTIDE ANTIBODY, IGG: Cyclic Citrullin Peptide Ab: <16 U

## 2015-08-02 ENCOUNTER — Encounter: Payer: Self-pay | Admitting: Sports Medicine

## 2015-08-02 LAB — ROCKY MTN SPOTTED FVR ABS PNL(IGG+IGM)
RMSF IgG: 0.22 IV
RMSF IgM: 0.13 IV

## 2015-08-02 LAB — HLA-B27 ANTIGEN: DNA Result:: NEGATIVE

## 2015-08-03 NOTE — Addendum Note (Signed)
Addended by: Silverio Decamp on: 08/03/2015 08:21 AM   Modules accepted: Orders

## 2015-08-04 ENCOUNTER — Other Ambulatory Visit: Payer: Self-pay | Admitting: Sports Medicine

## 2015-08-08 LAB — RIBOSOMAL P PROTEIN AB: Ribosomal P Protein Ab: <1

## 2015-08-08 LAB — SJOGRENS SYNDROME-A EXTRACTABLE NUCLEAR ANTIBODY: SSA (Ro) (ENA) Antibody, IgG: <1

## 2015-08-08 LAB — JO-1 ANTIBODY-IGG: Jo-1 Antibody, IgG: <1

## 2015-08-08 LAB — ANTI-DNA ANTIBODY, DOUBLE-STRANDED: ds DNA Ab: <1 [IU]/mL

## 2015-08-08 LAB — ANTI-RIBONUCLEIC ACID ANTIBODY: SM/RNP: <1

## 2015-08-08 LAB — ANTI-SCLERODERMA ANTIBODY: Scleroderma (Scl-70) (ENA) Antibody, IgG: <1

## 2015-08-08 LAB — ANTI-SMITH ANTIBODY: ENA SM Ab Ser-aCnc: <1

## 2015-08-08 LAB — SJOGRENS SYNDROME-B EXTRACTABLE NUCLEAR ANTIBODY: SSB (La) (ENA) Antibody, IgG: <1

## 2015-08-18 ENCOUNTER — Ambulatory Visit (INDEPENDENT_AMBULATORY_CARE_PROVIDER_SITE_OTHER): Payer: Managed Care, Other (non HMO) | Admitting: Sports Medicine

## 2015-08-18 ENCOUNTER — Encounter: Payer: Self-pay | Admitting: Sports Medicine

## 2015-08-18 VITALS — BP 119/82 | HR 80 | Resp 18 | Wt 170.3 lb

## 2015-08-18 DIAGNOSIS — M169 Osteoarthritis of hip, unspecified: Secondary | ICD-10-CM | POA: Diagnosis not present

## 2015-08-18 DIAGNOSIS — M24159 Other articular cartilage disorders, unspecified hip: Secondary | ICD-10-CM

## 2015-08-18 NOTE — Assessment & Plan Note (Signed)
Clinically represents left trochanteric bursitis today. Labral pain and tear is still present. She also has some sacroiliac joint pain. Left trochanteric bursa injection, formal physical therapy. Return in one month, SI joint injection in the left if no better.

## 2015-08-18 NOTE — Progress Notes (Signed)
  Subjective:    CC: Follow-up  HPI: Pain: Laura [name] returns, she has multiple pain complaints and has not yet started her Lyrica. Principal pain complaint today is on the left hip, greater trochanter, she also has some pain at the sacroiliac joint however this is secondary. Pain is moderate, persistent without radiation.  Past medical history, Surgical history, Family history not pertinant except as noted below, Social history, Allergies, and medications have been entered into the medical record, reviewed, and no changes needed.   Review of Systems: No fevers, chills, night sweats, weight loss, chest pain, or shortness of breath.   Objective:    General: Well Developed, well nourished, and in no acute distress.  Neuro: Alert and oriented x3, extra-ocular muscles intact, sensation grossly intact.  HEENT: Normocephalic, atraumatic, pupils equal round reactive to light, neck supple, no masses, no lymphadenopathy, thyroid nonpalpable.  Skin: Warm and dry, no rashes. Cardiac: Regular rate and rhythm, no murmurs rubs or gallops, no lower extremity edema.  Respiratory: Clear to auscultation bilaterally. Not using accessory muscles, speaking in full sentences. Left Hip: ROM IR: 60 Deg, ER: 60 Deg, Flexion: 120 Deg, Extension: 100 Deg, Abduction: 45 Deg, Adduction: 45 Deg Strength IR: 5/5, ER: 5/5, Flexion: 5/5, Extension: 5/5, Abduction: 5/5, Adduction: 5/5 Pelvic alignment unremarkable to inspection and palpation. Standing hip rotation and gait without trendelenburg / unsteadiness. Greater trochanter with tenderness to palpation. No tenderness over piriformis. There is also tenderness over the left SI joint.  Procedure: Real-time Ultrasound Guided Injection of left greater trochanteric bursa Device: GE Logiq E  Verbal informed consent obtained.  Time-out conducted.  Noted no overlying erythema, induration, or other signs of local infection.  Skin prepped in a sterile fashion.  Local  anesthesia: Topical Ethyl chloride.  With sterile technique and under real time ultrasound guidance:  Spinal needle advanced into the trochanteric bursa just deep to the hip abductors and external rotators, 1 mL kenalog 40, 2 mL lidocaine, 2 mL Marcaine injected easily. Completed without difficulty  Pain immediately resolved suggesting accurate placement of the medication.  Advised to call if fevers/chills, erythema, induration, drainage, or persistent bleeding.  Images permanently stored and available for review in the ultrasound unit.  Impression: Technically successful ultrasound guided injection.  Impression and Recommendations:

## 2015-08-22 ENCOUNTER — Encounter: Payer: Self-pay | Admitting: Sports Medicine

## 2015-08-28 ENCOUNTER — Ambulatory Visit: Payer: Managed Care, Other (non HMO) | Attending: Sports Medicine | Admitting: Physical Therapy

## 2015-08-28 ENCOUNTER — Encounter: Payer: Self-pay | Admitting: Physical Therapy

## 2015-08-28 DIAGNOSIS — M25652 Stiffness of left hip, not elsewhere classified: Secondary | ICD-10-CM | POA: Insufficient documentation

## 2015-08-28 DIAGNOSIS — M25552 Pain in left hip: Secondary | ICD-10-CM | POA: Insufficient documentation

## 2015-08-28 DIAGNOSIS — R29898 Other symptoms and signs involving the musculoskeletal system: Secondary | ICD-10-CM | POA: Diagnosis present

## 2015-08-28 DIAGNOSIS — R269 Unspecified abnormalities of gait and mobility: Secondary | ICD-10-CM | POA: Diagnosis present

## 2015-08-28 DIAGNOSIS — R198 Other specified symptoms and signs involving the digestive system and abdomen: Secondary | ICD-10-CM | POA: Diagnosis present

## 2015-08-28 NOTE — Therapy (Signed)
[facility] [address], [place], [place], [address] Phone: [phone]   Fax:  [phone]  Physical Therapy Evaluation  Patient Details  Name: Laura [name] MRN: [id number] Date of Birth: 02-Jun-1976 Referring Provider: Dr. Aundria Mems  Encounter Date: 08/28/2015      PT End of Session - 08/28/15 1328    Visit Number 1   Date for PT Re-Evaluation 10/23/15   Authorization Type Aetna   PT Start Time 1145   PT Stop Time 1230   PT Time Calculation (min) 45 min   Activity Tolerance Patient tolerated treatment well   Behavior During Therapy [facility] for tasks assessed/performed      Past Medical History  Diagnosis Date  . IBS (irritable bowel syndrome)     sees Dr. Eusebio Friendly in [place]   . Depression   . GERD (gastroesophageal reflux disease)   . Restless legs   . Insomnia   . Headache(784.0)   . UTI (lower urinary tract infection)     sees Dr Karsten Ro  . Anemia   . Anxiety   . Pituitary microadenoma with hyperprolactinemia [facility])     sees Dr. Mare Ferrari in [facility]    Past Surgical History  Procedure Laterality Date  . Tonsillectomy    . Abdominal hysterectomy  Nov 2008  . Neck surgery  JULY 1996    L side lymph node removal  . Wisdom tooth extraction    . Tubal ligation    . Esophagogastroduodenoscopy  08-01-14    per Dr. Cindee Salt, gastritis only   . Colonoscopy  08-01-14    per Dr. Cindee Salt, tubular adenoma, repeat in 5 yrs     There were no vitals filed for this visit.  Visit Diagnosis:  Left hip pain - Plan: PT plan of care cert/re-cert  Hip stiffness, left - Plan: PT plan of care cert/re-cert  Weakness of left hip - Plan: PT plan of care cert/re-cert  Abnormality of gait - Plan: PT plan of care cert/re-cert  Abdominal weakness - Plan: PT plan of care cert/re-cert      Subjective Assessment - 08/28/15 1152    Subjective Patient is unable to cross left leg over right from last  summer.  Patient went to therapy two times.  Patient also has trouble with left hip bursa and trouble with left SI joint. Had injection into left hip 1 and half weeks ago.  Therapy is to address inflammation on the left hip  and SI joint.    Diagnostic tests MRI showed a labral tear.    Currently in Pain? Yes   Pain Score 2    Pain Location Hip   Pain Orientation Left   Pain Descriptors / Indicators Aching;Sore   Pain Type Chronic pain   Pain Onset More than a month ago   Pain Frequency Intermittent   Aggravating Factors  lay on left hip; deep hip flexion, sitting for 1 hour and get up to walk; trying on pants with constant hip flexion to place leg into pants.    Pain Relieving Factors watches her movements   Effect of Pain on Daily Activities daily activities            [facility] PT Assessment - 08/28/15 0001    Assessment   Medical Diagnosis M16.9 Labral tear of hip, degenerative   Referring Provider Dr. Aundria Mems   Onset Date/Surgical Date 08/09/14   Prior Therapy two times   Precautions   Precautions Other (comment)  Precaution Comments Avoid postions of deep hip flexion    Restrictions   Weight Bearing Restrictions No   Balance Screen   Has the patient fallen in the past 6 months No   Has the patient had a decrease in activity level because of a fear of falling?  No   Is the patient reluctant to leave their home because of a fear of falling?  No   Home Environment   Living Environment Private residence   Type of [place] Access Level entry   Home Layout Two level   Alternate Level Stairs-Number of Steps --  avoids stairs due to pain   Additional Comments main bedroom on main floor   Prior Function   Level of Independence Independent   Vocation Full time employment   Vocation Requirements sits for 7 hours    Leisure membership to gym and does not go due to pain   Cognition   Overall Cognitive Status Within Functional Limits for tasks assessed    Observation/Other Assessments   Focus on Therapeutic Outcomes (FOTO)  43% limitation CK  34% limitation goal CJ   ROM / Strength   AROM / PROM / Strength AROM;Strength;PROM   AROM   Overall AROM Comments lumbar ROM is full   PROM   PROM Assessment Site Hip   Right/Left Hip Left   Left Hip Flexion 110   Left Hip External Rotation  40   Left Hip Internal Rotation  20   Strength   Overall Strength Comments abdominal strength is 3/5   Strength Assessment Site Hip   Right/Left Hip Left   Left Hip Flexion 4/5   Left Hip External Rotation 4/5   Left Hip Internal Rotation 5/5   Left Hip ABduction 4/5   Palpation   SI assessment  left ilium is rotated posteriorly; left sacrum rotated left   Palpation comment tenderness in left lower abdomen; tenderness located in lower abdomen; Palpable tenderness left obturator internist, lateral left hip, left piriformis, left gluteal   Ambulation/Gait   Ambulation/Gait Yes   Ambulation/Gait Assistance 7: Independent   Gait Pattern Decreased stance time - left;Decreased weight shift to left                 Pelvic Floor Special Questions - 08/28/15 0001    Prior Pregnancies Yes   Number of Pregnancies 2   Number of Vaginal Deliveries 2   Currently Sexually Active Yes   Marinoff Scale discomfort that does not affect completion  pressuer on pubic to coccyx bone   Urinary Leakage No   Pad use 0   Urinary urgency Yes   Urinary frequency alot of trips after lunch   Caffeine beverages 3 diets cokes in [place] an d1 coffee in morning   Exam Type Deferred          [facility] Adult PT Treatment/Exercise - 08/28/15 0001    Modalities   Modalities Iontophoresis   Iontophoresis   Type of Iontophoresis Dexamethasone   Location lateral left hip   Dose 73ml  #1   Time 6 hour patch                PT Education - 08/28/15 1327    Education provided Yes   Education Details iontophoresis contrainidications and precautions   Person(s)  Educated Patient   Methods Explanation;Demonstration;Verbal cues;Handout   Comprehension Returned demonstration;Verbalized understanding          PT Short Term Goals - 08/28/15 1343  PT SHORT TERM GOAL #1   Title independent with initial HEP   Time 4   Period Weeks   Status New   PT SHORT TERM GOAL #2   Title pelvis stays in alignment for 3 weeks to reduce strain on left hip   Time 4   Period Weeks   Status New   PT SHORT TERM GOAL #3   Title understand abdominal massage to reduce constipation to decreased strain on pelvic floor muscles by left hip   Time 4   Period Weeks   Status New   PT SHORT TERM GOAL #4   Title understand how to sit on the commode to have a bowel movement to reduce constipation and strain on left hip   Time 4   Period Weeks   Status New           PT Long Term Goals - 08/28/15 1345    PT LONG TERM GOAL #1   Title independent with HEP and understands how to progress HEP   Time 8   Period Weeks   Status New   PT LONG TERM GOAL #2   Title Left hip and Pelvic pain decreased >/= 50% with sitting, standing and walking   Time 8   Period Weeks   Status New   PT LONG TERM GOAL #3   Title left hip abduction strength is 5/5 so she is able to walk with reduced pain >/= 50%   Time 8   Period Weeks   Status New   PT LONG TERM GOAL #4   Title abdominal strength is 4/5 so she is able to stabilize her pelvic so she is able to sit with  pain decreased >/= 50%   Time 8   Period Weeks   Status New   PT LONG TERM GOAL #5   Title FOTO score is 34% limitation   Time 8   Period Weeks   Status New               Plan - 08/28/15 1335    Clinical Impression Statement Patient is a 40 year old female with diagnosis of left hip labral tear.  Patient reports the pain began 2 years agos but has become progressively worse.  Patient had a cortisone shot into left hip bursae and has reduced some symptoms.  Patient reports her left hip pain is 2/10  intemittiently with sitting, walking, standing.  Patient reports some pain with intercourse and pain that will travel from the  coccyx bone to the left pubic bone with sitting.  Palpable tendenress located in left ischiocavernosus, bulbocavernsosus, left obturator internis, left levator ani, left lower abdominal, left gluteal, left prirofrmis, left SI joint.  Left ilim is posteriorly rotated, Sacrum is rogated left.  Patient L5 to L1 rotated left.  Patient left hip ROM is reduced by 50% and strength averages 4/5.  Patient reports issues of constipation.  Patient would benefit from physical therapy to reduce pain, and restore function.    Pt will benefit from skilled therapeutic intervention in order to improve on the following deficits Abnormal gait;Increased fascial restricitons;Pain;Increased muscle spasms;Decreased mobility;Decreased activity tolerance;Decreased endurance;Decreased range of motion;Decreased strength;Hypomobility;Difficulty walking   Rehab Potential Good   Clinical Impairments Affecting Rehab Potential Labral tear on left hip;MD wants to avoid positions of deep hip flexion and focus on hip abductor rehabiliation   PT Frequency 2x / week   PT Duration 8 weeks   PT Treatment/Interventions ADLs/Self Care Home Management;Cryotherapy;Electrical Stimulation;Traction;Ultrasound;Iontophoresis  4mg /ml Dexamethasone;Gait training;Functional mobility training;Therapeutic activities;Therapeutic exercise;Balance training;Patient/family education;Neuromuscular re-education;Manual techniques;Dry needling;Passive range of motion   PT Next Visit Plan focuse on hip abduction rehab, include abdominal strength, no deep hip flexion, soft tissue work, ionto #2, correct pelvis,    PT Home Exercise Plan how to self correct SI joint; hip abduction strength   Recommended Other Services None   Consulted and Agree with Plan of Care Patient         Problem List Patient Active Problem List   Diagnosis Date  Noted  . Polyarthralgia 07/28/2015  . Carpal tunnel syndrome 04/20/2015  . Labral tear of hip, degenerative 02/07/2015  . Adenopathy 10/26/2014  . Chest pain 10/03/2014  . Lumbar radiculopathy 05/04/2014  . Cubital tunnel syndrome on left 05/04/2014  . Plantar fascial fibromatosis 04/06/2014  . Left shoulder pain 02/17/2014  . Hypersomnia, unspecified 01/11/2014  . Pes anserine bursitis 12/07/2012  . Right C8 Cervical radiculitis 08/26/2012  . Right shoulder pain 08/26/2012  . B12 deficiency 08/03/2012  . Pituitary microadenoma with hyperprolactinemia ([place]) 07/21/2012  . SYNCOPE 04/18/2010  . Acute sinusitis 02/06/2010  . ALLERGIC RHINITIS 02/10/2009  . SACROILIITIS 04/26/2008  . IRRITABLE BOWEL SYNDROME 02/25/2008  . ANXIETY 01/15/2008  . DEPRESSION 01/15/2008  . GERD 01/15/2008  . UTI'S, RECURRENT 01/15/2008  . INSOMNIA 01/15/2008  . HEADACHE 01/15/2008  . CHICKENPOX, HX OF 01/15/2008    Earlie Counts, PT 08/28/2015 1:48 PM   [facility] [address] [place], [place], [address] Phone: [phone]   Fax:  [phone]  Name: SAMETRA CIESLIK MRN: [id number] Date of Birth: 09-26-1975

## 2015-08-28 NOTE — Patient Instructions (Signed)

## 2015-08-29 ENCOUNTER — Encounter: Payer: Managed Care, Other (non HMO) | Admitting: Physical Therapy

## 2015-08-29 ENCOUNTER — Ambulatory Visit: Payer: Managed Care, Other (non HMO) | Admitting: Sports Medicine

## 2015-08-31 ENCOUNTER — Encounter: Payer: Managed Care, Other (non HMO) | Admitting: Physical Therapy

## 2015-09-05 ENCOUNTER — Encounter: Payer: Managed Care, Other (non HMO) | Admitting: Physical Therapy

## 2015-09-06 ENCOUNTER — Encounter: Payer: Self-pay | Admitting: Physical Therapy

## 2015-09-06 ENCOUNTER — Ambulatory Visit: Payer: Managed Care, Other (non HMO) | Admitting: Physical Therapy

## 2015-09-06 DIAGNOSIS — R269 Unspecified abnormalities of gait and mobility: Secondary | ICD-10-CM

## 2015-09-06 DIAGNOSIS — R29898 Other symptoms and signs involving the musculoskeletal system: Secondary | ICD-10-CM

## 2015-09-06 DIAGNOSIS — M25552 Pain in left hip: Secondary | ICD-10-CM | POA: Diagnosis not present

## 2015-09-06 DIAGNOSIS — R198 Other specified symptoms and signs involving the digestive system and abdomen: Secondary | ICD-10-CM

## 2015-09-06 DIAGNOSIS — M25652 Stiffness of left hip, not elsewhere classified: Secondary | ICD-10-CM

## 2015-09-06 NOTE — Patient Instructions (Addendum)
Lower abdominal/core stability exercises  1. Practice your breathing technique: Inhale through your nose expanding your belly and rib cage. Try not to breathe into your chest. Exhale slowly and gradually out your mouth feeling a sense of softness to your body. Practice multiple times. This can be performed unlimited.  2. Finding the lower abdominals. Laying on your back with the knees bent, place your fingers just below your belly button. Using your breathing technique from above, on your exhale gently pull the belly button away from your fingertips without tensing any other muscles. Practice this 5x. Next, as you exhale, draw belly button inwards and hold onto it...then feel as if you are pulling that muscle across your pelvis like you are tightening a belt. This can be hard to do at first so be patient and practice. Do 5-10 reps 1-3 x day. Always recognize quality over quantity; if your abdominal muscles become tired you will notice you may tighten/contract other muscles. This is the time to take a break.   Practice this first laying on your back, then in sitting, progressing to standing and finally adding it to all your daily movements.   3. Finding your pelvic floor. Using the breathing technique above, when your exhale, this time draw your pelvic floor muscles up as if you were attempting to stop the flow of urination. Be careful NOT to tense any other muscles. This can be hard, BE PATIENT. Try to hold up to 10 seconds repeating 10x. Try 2x a day. Once you feel you are doing this well, add this contraction to exercise #2. First contracting your pelvic floor followed by lower abdominals.  4. Adding leg movements. Add the following leg movements to challenge your ability to keep your core stable:  1. Single leg drop outs: Laying on your back with knees bent feet flat. Inhale,  dropping one knee outward KEEPING YOUR PELVIS STILL. Exhale as you bring the leg back, simultaneously performing your lower  abdominal contraction. Do 5-10 on each leg.  2. Marching: While keeping your pelvis still, lift the right foot a few inches, put it down then lift left foot. This will mimic a march. Start slow to establish control. Once you have control you may speed it up. Do 10-20x. You MUST keep your lower abdominlas contracted while you march. Breathe naturally   3. Single leg slides: Inhale while you slowly slide one leg out keeping your pelvis still. Only slide your leg as far as you can keep your pelvis still. Exhale as you bring the leg back to the start, contracting the lower abdominals as you do that. Keep your upper body relaxed. Do 5-10 on each side.  ABDUCTION: Standing (Active)    Stand, feet flat. Tap the left right toe out to side. Ignore the ankle weights. Work on keeping the pelvis level, do not use the back.  Lower abs pulling in.  Complete __1_ sets of 5___ repetitions. Perform _2-3__ sessions per day.  http://gtsc.exer.us/111   Copyright  VHI. All rights reserved.

## 2015-09-06 NOTE — Therapy (Signed)
[facility] [address], [place], [place], [address] Phone: [phone]   Fax:  [phone]  Physical Therapy Treatment  Patient Details  Name: Laura [name] MRN: [id number] Date of Birth: [birth year] Referring Provider: Dr. Aundria Mems  Encounter Date: 09/06/2015      PT End of Session - 09/06/15 1150    Visit Number 2   Date for PT Re-Evaluation 10/23/15   Authorization Type Aetna   PT Start Time 1146   PT Stop Time 1245   PT Time Calculation (min) 59 min   Activity Tolerance Patient limited by pain      Past Medical History  Diagnosis Date  . IBS (irritable bowel syndrome)     sees Dr. Eusebio Friendly in [place]   . Depression   . GERD (gastroesophageal reflux disease)   . Restless legs   . Insomnia   . Headache(784.0)   . UTI (lower urinary tract infection)     sees Dr Karsten Ro  . Anemia   . Anxiety   . Pituitary microadenoma with hyperprolactinemia [facility])     sees Dr. Mare Ferrari in [facility]    Past Surgical History  Procedure Laterality Date  . Tonsillectomy    . Abdominal hysterectomy  Nov 2008  . Neck surgery  JULY 1996    L side lymph node removal  . Wisdom tooth extraction    . Tubal ligation    . Esophagogastroduodenoscopy  08-01-14    per Dr. Cindee Salt, gastritis only   . Colonoscopy  08-01-14    per Dr. Cindee Salt, tubular adenoma, repeat in 5 yrs     There were no vitals filed for this visit.  Visit Diagnosis:  Left hip pain  Hip stiffness, left  Weakness of left hip  Abnormality of gait  Abdominal weakness      Subjective Assessment - 09/06/15 1150    Subjective Bad day: a lot of pain anterior with intermittent sharp pain. Lateral hip and SI sore but Ionto was good.    Currently in Pain? Yes   Pain Score 6    Pain Location Hip   Pain Orientation Left   Pain Descriptors / Indicators Sharp;Shooting   Aggravating Factors  see below   Pain Relieving Factors Watching  her movements, some meds   Multiple Pain Sites No                         [facility] Adult PT Treatment/Exercise - 09/06/15 0001    Lumbar Exercises: Supine   Ab Set --  TA activation in multiple positions   Other Supine Lumbar Exercises Knee drop 5x bil   VC to slow speed secondary diffcult to keep pelvis still   Electrical Stimulation   Electrical Stimulation Location LT SI, glutes, anterior lateral hip   Electrical Stimulation Action IFC   Electrical Stimulation Parameters 80-150 HZ   Electrical Stimulation Goals Pain   Iontophoresis   Type of Iontophoresis Dexamethasone   Location lateral left hip   Dose 1 ml  #2   Time  6 hr wear skin intact   Manual Therapy   Manual Therapy --  Pelvis aligned                PT Education - 09/06/15 1207    Education provided Yes   Education Details TA contraction, hip abduction in standing which is more of a toe tap, TA with knee drops Bill all for HEP  Person(s) Educated Patient   Methods Explanation;Demonstration;Tactile cues;Verbal cues;Handout   Comprehension Verbalized understanding;Returned demonstration          PT Short Term Goals - 09/06/15 1229    PT SHORT TERM GOAL #1   Title independent with initial HEP   Time 4   Period Weeks   Status On-going   PT SHORT TERM GOAL #2   Title pelvis stays in alignment for 3 weeks to reduce strain on left hip   Time 4   Period Weeks   Status On-going           PT Long Term Goals - 08/28/15 1345    PT LONG TERM GOAL #1   Title independent with HEP and understands how to progress HEP   Time 8   Period Weeks   Status New   PT LONG TERM GOAL #2   Title Left hip and Pelvic pain decreased >/= 50% with sitting, standing and walking   Time 8   Period Weeks   Status New   PT LONG TERM GOAL #3   Title left hip abduction strength is 5/5 so she is able to walk with reduced pain >/= 50%   Time 8   Period Weeks   Status New   PT LONG TERM GOAL #4   Title  abdominal strength is 4/5 so she is able to stabilize her pelvic so she is able to sit with  pain decreased >/= 50%   Time 8   Period Weeks   Status New   PT LONG TERM GOAL #5   Title FOTO score is 34% limitation   Time 8   Period Weeks   Status New               Plan - 09/06/15 1227    Clinical Impression Statement Fairly significant anterior hip and thigh pain today. Pt is frustrated with this. Pelvis is in correct alignment today and she was able to perform level one core exercises in supine. Veyr difficult to perform hip abduction in standing as she uses her back and hikes the pelvis up. Pt likes the ionto pathc, thinks it was helpfful.     Pt will benefit from skilled therapeutic intervention in order to improve on the following deficits Abnormal gait;Increased fascial restricitons;Pain;Increased muscle spasms;Decreased mobility;Decreased activity tolerance;Decreased endurance;Decreased range of motion;Decreased strength;Hypomobility;Difficulty walking   Rehab Potential Good   Clinical Impairments Affecting Rehab Potential Labral tear on left hip;MD wants to avoid positions of deep hip flexion and focus on hip abductor rehabiliation   PT Frequency 2x / week   PT Duration 8 weeks   PT Treatment/Interventions ADLs/Self Care Home Management;Cryotherapy;Electrical Stimulation;Traction;Ultrasound;Iontophoresis 4mg /ml Dexamethasone;Gait training;Functional mobility training;Therapeutic activities;Therapeutic exercise;Balance training;Patient/family education;Neuromuscular re-education;Manual techniques;Dry needling;Passive range of motion   PT Next Visit Plan Review HEP given today. Check on pain levels. Ionto. Work on correct hip abductor activation   Consulted and Agree with Plan of Care Patient        Problem List Patient Active Problem List   Diagnosis Date Noted  . Polyarthralgia 07/28/2015  . Carpal tunnel syndrome 04/20/2015  . Labral tear of hip, degenerative 02/07/2015   . Adenopathy 10/26/2014  . Chest pain 10/03/2014  . Lumbar radiculopathy 05/04/2014  . Cubital tunnel syndrome on left 05/04/2014  . Plantar fascial fibromatosis 04/06/2014  . Left shoulder pain 02/17/2014  . Hypersomnia, unspecified 01/11/2014  . Pes anserine bursitis 12/07/2012  . Right C8 Cervical radiculitis 08/26/2012  . Right shoulder pain 08/26/2012  .  B12 deficiency 08/03/2012  . Pituitary microadenoma with hyperprolactinemia ([place]) 07/21/2012  . SYNCOPE 04/18/2010  . Acute sinusitis 02/06/2010  . ALLERGIC RHINITIS 02/10/2009  . SACROILIITIS 04/26/2008  . IRRITABLE BOWEL SYNDROME 02/25/2008  . ANXIETY 01/15/2008  . DEPRESSION 01/15/2008  . GERD 01/15/2008  . UTI'S, RECURRENT 01/15/2008  . INSOMNIA 01/15/2008  . HEADACHE 01/15/2008  . CHICKENPOX, HX OF 01/15/2008    COCHRAN,JENNIFER, PTA 09/06/2015, 2:14 PM  [facility] [address], [place], [place], [address] Phone: [phone]   Fax:  [phone]  Name: MARIJAH MOLENDA MRN: [id number] Date of Birth: 1975-08-25

## 2015-09-07 ENCOUNTER — Encounter: Payer: Managed Care, Other (non HMO) | Admitting: Physical Therapy

## 2015-09-08 ENCOUNTER — Ambulatory Visit: Payer: Managed Care, Other (non HMO) | Admitting: Physical Therapy

## 2015-09-08 ENCOUNTER — Encounter: Payer: Managed Care, Other (non HMO) | Admitting: Physical Therapy

## 2015-09-08 ENCOUNTER — Encounter: Payer: Self-pay | Admitting: Physical Therapy

## 2015-09-08 DIAGNOSIS — R29898 Other symptoms and signs involving the musculoskeletal system: Secondary | ICD-10-CM

## 2015-09-08 DIAGNOSIS — M25552 Pain in left hip: Secondary | ICD-10-CM

## 2015-09-08 DIAGNOSIS — M25652 Stiffness of left hip, not elsewhere classified: Secondary | ICD-10-CM

## 2015-09-08 DIAGNOSIS — R198 Other specified symptoms and signs involving the digestive system and abdomen: Secondary | ICD-10-CM

## 2015-09-08 DIAGNOSIS — R269 Unspecified abnormalities of gait and mobility: Secondary | ICD-10-CM

## 2015-09-08 NOTE — Patient Instructions (Signed)
About Abdominal Massage  Abdominal massage, also called external colon massage, is a self-treatment circular massage technique that can reduce and eliminate gas and ease constipation. The colon naturally contracts in waves in a clockwise direction starting from inside the right hip, moving up toward the ribs, across the belly, and down inside the left hip.  When you perform circular abdominal massage, you help stimulate your colon's normal wave pattern of movement called peristalsis.  It is most beneficial when done after eating.  Positioning You can practice abdominal massage with oil while lying down, or in the shower with soap.  Some people find that it is just as effective to do the massage through clothing while sitting or standing.  How to Massage Start by placing your finger tips or knuckles on your right side, just inside your hip bone.  . Make small circular movements while you move upward toward your rib cage.   . Once you reach the bottom right side of your rib cage, take your circular movements across to the left side of the bottom of your rib cage.  . Next, move downward until you reach the inside of your left hip bone.  This is the path your feces travel in your colon. . Continue to perform your abdominal massage in this pattern for 10 minutes each day.     You can apply as much pressure as is comfortable in your massage.  Start gently and build pressure as you continue to practice.  Notice any areas of pain as you massage; areas of slight pain may be relieved as you massage, but if you have areas of significant or intense pain, consult with your healthcare provider.  Other Considerations . General physical activity including bending and stretching can have a beneficial massage-like effect on the colon.  Deep breathing can also stimulate the colon because breathing deeply activates the same nervous system that supplies the colon.   . Abdominal massage should always be used in  combination with a bowel-conscious diet that is high in the proper type of fiber for you, fluids (primarily water), and a regular exercise program. [facility] [address], [address] [place], [place] [address] Phone # [phone] Fax [phone]  

## 2015-09-08 NOTE — Therapy (Signed)
[facility] [address] [place], [place], [address] Phone: [phone]   Fax:  [phone]  Physical Therapy Treatment  Patient Details  Name: Laura [name] MRN: [id number] Date of Birth: August 30, 1975 Referring Provider: Dr. Aundria Mems  Encounter Date: 09/08/2015      PT End of Session - 09/08/15 1153    Visit Number 3   Date for PT Re-Evaluation 10/23/15   Authorization Type Aetna   PT Start Time 1015   PT Stop Time 1115   PT Time Calculation (min) 60 min   Activity Tolerance Patient tolerated treatment well   Behavior During Therapy [place] for tasks assessed/performed      Past Medical History  Diagnosis Date  . IBS (irritable bowel syndrome)     sees Dr. Eusebio Friendly in [place]   . Depression   . GERD (gastroesophageal reflux disease)   . Restless legs   . Insomnia   . Headache(784.0)   . UTI (lower urinary tract infection)     sees Dr Karsten Ro  . Anemia   . Anxiety   . Pituitary microadenoma with hyperprolactinemia [facility])     sees Dr. Mare Ferrari in [facility]    Past Surgical History  Procedure Laterality Date  . Tonsillectomy    . Abdominal hysterectomy  Nov 2008  . Neck surgery  JULY 1996    L side lymph node removal  . Wisdom tooth extraction    . Tubal ligation    . Esophagogastroduodenoscopy  08-01-14    per Dr. Cindee Salt, gastritis only   . Colonoscopy  08-01-14    per Dr. Cindee Salt, tubular adenoma, repeat in 5 yrs     There were no vitals filed for this visit.  Visit Diagnosis:  Left hip pain  Hip stiffness, left  Weakness of left hip  Abnormality of gait  Abdominal weakness      Subjective Assessment - 09/08/15 1020    Subjective I feel better.    Diagnostic tests MRI showed a labral tear.    Currently in Pain? Yes   Pain Score 3    Pain Location Hip   Pain Orientation Left   Pain Descriptors / Indicators Sharp;Shooting   Pain Type Chronic pain   Pain Onset More  than a month ago   Pain Frequency Intermittent   Aggravating Factors  see below   Pain Relieving Factors watching her movements, some meds   Multiple Pain Sites No            [facility] PT Assessment - 09/08/15 0001    Palpation   SI assessment  pelvis in correct alignment                     [facility] Adult PT Treatment/Exercise - 09/08/15 0001    Lumbar Exercises: Supine   Bridge 10 reps;Compliant;1 second;Other (comment)  5 no band, 5 red band for knees out   Bridge Limitations shaky trunk   Other Supine Lumbar Exercises Knee drop 5x bil   VC to slow speed secondary diffcult to keep pelvis still   Knee/Hip Exercises: Standing   Hip Abduction Stengthening;Left;10 reps  tactile cues to keep left hip down   Knee/Hip Exercises: Supine   Other Supine Knee/Hip Exercises supine hip abduction with isometrics through motion then 15 times hip abduction with tacile cues to contrcat correct msucles   Modalities   Modalities Moist Heat;Electrical Stimulation   Moist Heat Therapy   Number Minutes Moist Heat  15 Minutes   Moist Heat Location Hip   Electrical Stimulation   Electrical Stimulation Location LT SI, glutes, anterior lateral hip   Electrical Stimulation Action IFC   Electrical Stimulation Parameters to patient tolerance, 15 min   Electrical Stimulation Goals Pain   Manual Therapy   Manual Therapy Soft tissue mobilization   Soft tissue mobilization left lower abdominal, bil. diaphragm,, tenderness left levator ani                PT Education - 09/08/15 1104    Education provided Yes   Education Details abdominal massage   Person(s) Educated Patient   Methods Explanation;Demonstration;Handout;Verbal cues   Comprehension Verbalized understanding;Returned demonstration          PT Short Term Goals - 09/06/15 1229    PT SHORT TERM GOAL #1   Title independent with initial HEP   Time 4   Period Weeks   Status On-going   PT SHORT TERM GOAL #2   Title  pelvis stays in alignment for 3 weeks to reduce strain on left hip   Time 4   Period Weeks   Status On-going           PT Long Term Goals - 08/28/15 1345    PT LONG TERM GOAL #1   Title independent with HEP and understands how to progress HEP   Time 8   Period Weeks   Status New   PT LONG TERM GOAL #2   Title Left hip and Pelvic pain decreased >/= 50% with sitting, standing and walking   Time 8   Period Weeks   Status New   PT LONG TERM GOAL #3   Title left hip abduction strength is 5/5 so she is able to walk with reduced pain >/= 50%   Time 8   Period Weeks   Status New   PT LONG TERM GOAL #4   Title abdominal strength is 4/5 so she is able to stabilize her pelvic so she is able to sit with  pain decreased >/= 50%   Time 8   Period Weeks   Status New   PT LONG TERM GOAL #5   Title FOTO score is 34% limitation   Time 8   Period Weeks   Status New               Plan - 09/08/15 1154    Clinical Impression Statement Patient able to leave with less pain.  Patient had increased control of left hip for abduction in standing without compensation.  Patient has muscular pain in abdoment.  Patient  will benefit  from physical therapy to reduce pain and improve  strength.    Pt will benefit from skilled therapeutic intervention in order to improve on the following deficits Abnormal gait;Increased fascial restricitons;Pain;Increased muscle spasms;Decreased mobility;Decreased activity tolerance;Decreased endurance;Decreased range of motion;Decreased strength;Hypomobility;Difficulty walking   Rehab Potential Good   Clinical Impairments Affecting Rehab Potential Labral tear on left hip;MD wants to avoid positions of deep hip flexion and focus on hip abductor rehabiliation   PT Frequency 2x / week   PT Duration 8 weeks   PT Treatment/Interventions ADLs/Self Care Home Management;Cryotherapy;Electrical Stimulation;Traction;Ultrasound;Iontophoresis 4mg /ml Dexamethasone;Gait  training;Functional mobility training;Therapeutic activities;Therapeutic exercise;Balance training;Patient/family education;Neuromuscular re-education;Manual techniques;Dry needling;Passive range of motion   PT Next Visit Plan ionto #3, hip strength with abdominal brace, soft tissue work, modalities as needed   PT Home Exercise Plan progress as needed   Consulted and Agree with Plan of Care  Patient        Problem List Patient Active Problem List   Diagnosis Date Noted  . Polyarthralgia 07/28/2015  . Carpal tunnel syndrome 04/20/2015  . Labral tear of hip, degenerative 02/07/2015  . Adenopathy 10/26/2014  . Chest pain 10/03/2014  . Lumbar radiculopathy 05/04/2014  . Cubital tunnel syndrome on left 05/04/2014  . Plantar fascial fibromatosis 04/06/2014  . Left shoulder pain 02/17/2014  . Hypersomnia, unspecified 01/11/2014  . Pes anserine bursitis 12/07/2012  . Right C8 Cervical radiculitis 08/26/2012  . Right shoulder pain 08/26/2012  . B12 deficiency 08/03/2012  . Pituitary microadenoma with hyperprolactinemia ([place]) 07/21/2012  . SYNCOPE 04/18/2010  . Acute sinusitis 02/06/2010  . ALLERGIC RHINITIS 02/10/2009  . SACROILIITIS 04/26/2008  . IRRITABLE BOWEL SYNDROME 02/25/2008  . ANXIETY 01/15/2008  . DEPRESSION 01/15/2008  . GERD 01/15/2008  . UTI'S, RECURRENT 01/15/2008  . INSOMNIA 01/15/2008  . HEADACHE 01/15/2008  . CHICKENPOX, HX OF 01/15/2008    Earlie Counts, PT 09/08/2015 12:01 PM    [facility] [address], [place], [place], [address] Phone: [phone]   Fax:  [phone]  Name: AULINE KUYPER MRN: [id number] Date of Birth: 1976-06-04

## 2015-09-11 ENCOUNTER — Ambulatory Visit: Payer: Managed Care, Other (non HMO) | Admitting: Physical Therapy

## 2015-09-13 ENCOUNTER — Ambulatory Visit: Payer: Managed Care, Other (non HMO) | Attending: Sports Medicine | Admitting: Physical Therapy

## 2015-09-13 ENCOUNTER — Encounter: Payer: Self-pay | Admitting: Physical Therapy

## 2015-09-13 DIAGNOSIS — R29898 Other symptoms and signs involving the musculoskeletal system: Secondary | ICD-10-CM | POA: Insufficient documentation

## 2015-09-13 DIAGNOSIS — M6281 Muscle weakness (generalized): Secondary | ICD-10-CM | POA: Diagnosis present

## 2015-09-13 DIAGNOSIS — M25652 Stiffness of left hip, not elsewhere classified: Secondary | ICD-10-CM | POA: Insufficient documentation

## 2015-09-13 DIAGNOSIS — R198 Other specified symptoms and signs involving the digestive system and abdomen: Secondary | ICD-10-CM | POA: Insufficient documentation

## 2015-09-13 DIAGNOSIS — M25552 Pain in left hip: Secondary | ICD-10-CM | POA: Diagnosis present

## 2015-09-13 NOTE — Therapy (Addendum)
[facility] [address], [place], [place], [address] Phone: [phone]   Fax:  [phone]  Physical Therapy Treatment  Patient Details  Name: Laura [name] MRN: [id number] Date of Birth: 30-Apr-1976 Referring Provider: Dr. Aundria Mems  Encounter Date: 09/13/2015      PT End of Session - 09/13/15 1314    Visit Number 4   Date for PT Re-Evaluation 10/23/15   Authorization Type Aetna   PT Start Time 1230   PT Stop Time 1310   PT Time Calculation (min) 40 min   Activity Tolerance Patient tolerated treatment well   Behavior During Therapy [facility] for tasks assessed/performed      Past Medical History  Diagnosis Date  . IBS (irritable bowel syndrome)     sees Dr. Eusebio Friendly in [place]   . Depression   . GERD (gastroesophageal reflux disease)   . Restless legs   . Insomnia   . Headache(784.0)   . UTI (lower urinary tract infection)     sees Dr Karsten Ro  . Anemia   . Anxiety   . Pituitary microadenoma with hyperprolactinemia [facility])     sees Dr. Mare Ferrari in [facility]    Past Surgical History  Procedure Laterality Date  . Tonsillectomy    . Abdominal hysterectomy  Nov 2008  . Neck surgery  JULY 1996    L side lymph node removal  . Wisdom tooth extraction    . Tubal ligation    . Esophagogastroduodenoscopy  08-01-14    per Dr. Cindee Salt, gastritis only   . Colonoscopy  08-01-14    per Dr. Cindee Salt, tubular adenoma, repeat in 5 yrs     There were no vitals filed for this visit.  Visit Diagnosis:  Pain in left hip Stiffness of left hip, not elsewhere classified Muscle weakness (generalized) Earlie Counts, PT 09/27/2015 10:39 AM        Subjective Assessment - 09/13/15 1235    Subjective I have pain with sitting.  I see my sports medicine doctor on friday.    Diagnostic tests MRI showed a labral tear.    Currently in Pain? Yes   Pain Score 3    Pain Location Hip   Pain Orientation Left   Pain  Descriptors / Indicators Sharp;Shooting   Pain Type Chronic pain   Pain Onset More than a month ago   Pain Frequency Intermittent   Aggravating Factors  sitting, moving hip wrong.    Pain Relieving Factors watching movements   Effect of Pain on Daily Activities daily activities   Multiple Pain Sites No            [facility] PT Assessment - 09/13/15 0001    Assessment   Medical Diagnosis M16.9 Labral tear of hip, degenerative   Onset Date/Surgical Date 08/09/14   Prior Therapy two times   [place] residence   Prior Function   Level of Independence Independent   Cognition   Overall Cognitive Status Within Functional Limits for tasks assessed   Observation/Other Assessments   Focus on Therapeutic Outcomes (FOTO)  43% limitation CK  34% limitation goal CJ   Strength   Strength Assessment Site Hip   Right/Left Hip Left   Left Hip Flexion 4/5   Left Hip External Rotation 4/5   Left Hip ABduction 3+/5                     [facility] Adult PT  Treatment/Exercise - 09/13/15 0001    Manual Therapy   Manual Therapy Soft tissue mobilization;Myofascial release   Soft tissue mobilization left hip, ITB, left quads, left obturator internist, left hip adductor, left groin, abdominal area                PT Education - 09/13/15 1314    Education provided No          PT Short Term Goals - 09/06/15 1229    PT SHORT TERM GOAL #1   Title independent with initial HEP   Time 4   Period Weeks   Status On-going   PT SHORT TERM GOAL #2   Title pelvis stays in alignment for 3 weeks to reduce strain on left hip   Time 4   Period Weeks   Status On-going           PT Long Term Goals - 08/28/15 1345    PT LONG TERM GOAL #1   Title independent with HEP and understands how to progress HEP   Time 8   Period Weeks   Status New   PT LONG TERM GOAL #2   Title Left hip and Pelvic pain decreased >/= 50% with sitting, standing and walking    Time 8   Period Weeks   Status New   PT LONG TERM GOAL #3   Title left hip abduction strength is 5/5 so she is able to walk with reduced pain >/= 50%   Time 8   Period Weeks   Status New   PT LONG TERM GOAL #4   Title abdominal strength is 4/5 so she is able to stabilize her pelvic so she is able to sit with  pain decreased >/= 50%   Time 8   Period Weeks   Status New   PT LONG TERM GOAL #5   Title FOTO score is 34% limitation   Time 8   Period Weeks   Status New               Plan - 09/13/15 1315    Clinical Impression Statement Patient has tenderness in left hip musculature.  Patient has difficulty with weightbear on left leg.  Patient continues to have weakness in left hip. Patient understands how to strengthen her left hip.  Patient will compensate with walking by hiking up her hip.  Patient is unable to lay on her left hip.  Patient will benefit from physical therapy to reduce pain and  increase strength.    Pt will benefit from skilled therapeutic intervention in order to improve on the following deficits Abnormal gait;Increased fascial restricitons;Pain;Increased muscle spasms;Decreased mobility;Decreased activity tolerance;Decreased endurance;Decreased range of motion;Decreased strength;Hypomobility;Difficulty walking   Rehab Potential Good   Clinical Impairments Affecting Rehab Potential Labral tear on left hip;MD wants to avoid positions of deep hip flexion and focus on hip abductor rehabiliation   PT Frequency 2x / week   PT Duration 8 weeks   PT Treatment/Interventions ADLs/Self Care Home Management;Cryotherapy;Electrical Stimulation;Traction;Ultrasound;Iontophoresis 4mg /ml Dexamethasone;Gait training;Functional mobility training;Therapeutic activities;Therapeutic exercise;Balance training;Patient/family education;Neuromuscular re-education;Manual techniques;Dry needling;Passive range of motion   PT Next Visit Plan ionto #3, hip strength with abdominal brace, soft  tissue work, modalities as needed; see what MD says   PT Home Exercise Plan progress as needed   Consulted and Agree with Plan of Care Patient        Problem List Patient Active Problem List   Diagnosis Date Noted  . Polyarthralgia 07/28/2015  . Carpal tunnel  syndrome 04/20/2015  . Labral tear of hip, degenerative 02/07/2015  . Adenopathy 10/26/2014  . Chest pain 10/03/2014  . Lumbar radiculopathy 05/04/2014  . Cubital tunnel syndrome on left 05/04/2014  . Plantar fascial fibromatosis 04/06/2014  . Left shoulder pain 02/17/2014  . Hypersomnia, unspecified 01/11/2014  . Pes anserine bursitis 12/07/2012  . Right C8 Cervical radiculitis 08/26/2012  . Right shoulder pain 08/26/2012  . B12 deficiency 08/03/2012  . Pituitary microadenoma with hyperprolactinemia ([place]) 07/21/2012  . SYNCOPE 04/18/2010  . Acute sinusitis 02/06/2010  . ALLERGIC RHINITIS 02/10/2009  . SACROILIITIS 04/26/2008  . IRRITABLE BOWEL SYNDROME 02/25/2008  . ANXIETY 01/15/2008  . DEPRESSION 01/15/2008  . GERD 01/15/2008  . UTI'S, RECURRENT 01/15/2008  . INSOMNIA 01/15/2008  . HEADACHE 01/15/2008  . CHICKENPOX, HX OF 01/15/2008    Earlie Counts, PT 09/13/2015 1:20 PM   [facility] [address] [place], [place], [address] Phone: [phone]   Fax:  [phone]  Name: GERRIANN EA MRN: [id number] Date of Birth: 1975-10-28

## 2015-09-15 ENCOUNTER — Ambulatory Visit (INDEPENDENT_AMBULATORY_CARE_PROVIDER_SITE_OTHER): Payer: Managed Care, Other (non HMO) | Admitting: Sports Medicine

## 2015-09-15 ENCOUNTER — Encounter: Payer: Self-pay | Admitting: Sports Medicine

## 2015-09-15 VITALS — BP 117/79 | HR 77 | Resp 18 | Wt 168.1 lb

## 2015-09-15 DIAGNOSIS — M24159 Other articular cartilage disorders, unspecified hip: Secondary | ICD-10-CM

## 2015-09-15 DIAGNOSIS — M169 Osteoarthritis of hip, unspecified: Secondary | ICD-10-CM | POA: Diagnosis not present

## 2015-09-15 DIAGNOSIS — F329 Major depressive disorder, single episode, unspecified: Secondary | ICD-10-CM | POA: Diagnosis not present

## 2015-09-15 DIAGNOSIS — M7918 Myalgia, other site: Secondary | ICD-10-CM

## 2015-09-15 DIAGNOSIS — K219 Gastro-esophageal reflux disease without esophagitis: Secondary | ICD-10-CM

## 2015-09-15 DIAGNOSIS — K589 Irritable bowel syndrome without diarrhea: Secondary | ICD-10-CM

## 2015-09-15 DIAGNOSIS — M791 Myalgia: Secondary | ICD-10-CM | POA: Diagnosis not present

## 2015-09-15 DIAGNOSIS — F32A Depression, unspecified: Secondary | ICD-10-CM

## 2015-09-15 MED ORDER — PREGABALIN 25 MG PO CAPS: 25.0000 mg | ORAL_CAPSULE | Freq: Two times a day (BID) | ORAL | Status: DC

## 2015-09-15 NOTE — Progress Notes (Signed)
  Subjective:    CC: Follow-up  HPI: Left hip pain: Known labral tear, this pain is persistent.  Trochanter bursitis: Resolved after injection at the last visit  Left sided SI joint pain: Lyrica 75 mg provided complete relief of pain symptoms however she felt a bit woozy, and drugs. She agrees to try a lower dose.  Past medical history, Surgical history, Family history not pertinant except as noted below, Social history, Allergies, and medications have been entered into the medical record, reviewed, and no changes needed.   Review of Systems: No fevers, chills, night sweats, weight loss, chest pain, or shortness of breath.   Objective:    General: Well Developed, well nourished, and in no acute distress.  Neuro: Alert and oriented x3, extra-ocular muscles intact, sensation grossly intact.  HEENT: Normocephalic, atraumatic, pupils equal round reactive to light, neck supple, no masses, no lymphadenopathy, thyroid nonpalpable.  Skin: Warm and dry, no rashes. Cardiac: Regular rate and rhythm, no murmurs rubs or gallops, no lower extremity edema.  Respiratory: Clear to auscultation bilaterally. Not using accessory muscles, speaking in full sentences.  Impression and Recommendations:    I spent 25 minutes with this patient, greater than 50% was face-to-face time counseling regarding the above diagnoses

## 2015-09-15 NOTE — Assessment & Plan Note (Signed)
Lyrica did well, 75 mg was simply too high of a dose. Rheumatologic workup as well as visits with her neurologist have been negative. Giving a prescription for 25 mg of Lyrica with a free 14 day trial. Return in one month to evaluate response.

## 2015-09-15 NOTE — Assessment & Plan Note (Signed)
Trochanteric bursitis is resolved after injection at the last visit, she still has pain from her labral tear at the anterior joint line, as well as some sacroiliac pain, hopefully the sacroiliac joint pain will resolve with Lyrica low-dose, and we will have to do an SI joint injection. We can do the injection if not even a little bit better on Lyrica 25 at the next visit.

## 2015-09-18 ENCOUNTER — Encounter: Payer: Self-pay | Admitting: Physical Therapy

## 2015-09-18 ENCOUNTER — Ambulatory Visit: Payer: Managed Care, Other (non HMO) | Admitting: Physical Therapy

## 2015-09-18 DIAGNOSIS — M25552 Pain in left hip: Secondary | ICD-10-CM

## 2015-09-18 DIAGNOSIS — M25652 Stiffness of left hip, not elsewhere classified: Secondary | ICD-10-CM

## 2015-09-18 DIAGNOSIS — M6281 Muscle weakness (generalized): Secondary | ICD-10-CM

## 2015-09-18 NOTE — Therapy (Addendum)
[facility] [address] [place], [place], [address] Phone: [phone]   Fax:  [phone]  Physical Therapy Treatment  Patient Details  Name: Laura [name] MRN: [id number] Date of Birth: 02-09-1976 Referring Provider: Dr. Aundria Mems  Encounter Date: 09/18/2015      PT End of Session - 09/18/15 1200    Visit Number 5   Date for PT Re-Evaluation 10/23/15   Authorization Type Aetna   PT Start Time 1150   PT Stop Time 1238   PT Time Calculation (min) 48 min   Activity Tolerance Patient tolerated treatment well   Behavior During Therapy [facility] for tasks assessed/performed      Past Medical History  Diagnosis Date  . IBS (irritable bowel syndrome)     sees Dr. Eusebio Friendly in [place]   . Depression   . GERD (gastroesophageal reflux disease)   . Restless legs   . Insomnia   . Headache(784.0)   . UTI (lower urinary tract infection)     sees Dr Karsten Ro  . Anemia   . Anxiety   . Pituitary microadenoma with hyperprolactinemia [facility])     sees Dr. Mare Ferrari in [facility]    Past Surgical History  Procedure Laterality Date  . Tonsillectomy    . Abdominal hysterectomy  Nov 2008  . Neck surgery  JULY 1996    L side lymph node removal  . Wisdom tooth extraction    . Tubal ligation    . Esophagogastroduodenoscopy  08-01-14    per Dr. Cindee Salt, gastritis only   . Colonoscopy  08-01-14    per Dr. Cindee Salt, tubular adenoma, repeat in 5 yrs     There were no vitals filed for this visit.      Subjective Assessment - 09/18/15 1201    Subjective Had severe cramps in both lower legs al weekend. Pt is questioning whether or not it is from the new medication she is taking , Lyrica. She was encouraged to discuss with MD. Clarnce Flock MD on Friday. He wants to see pt in a month to see if the hip pain will calm down . He agreed it is from the labral tear.    Currently in Pain? Yes   Pain Score 3    Pain Location Hip   Pain  Orientation Left   Pain Descriptors / Indicators Sharp;Shooting   Aggravating Factors  Crossing legs, getting up from squat.   Pain Relieving Factors Watching movements   Multiple Pain Sites No                         [facility] Adult PT Treatment/Exercise - 09/18/15 0001    Lumbar Exercises: Supine   Ab Set --  TA contraction 10x   Other Supine Lumbar Exercises Knee drop bil 6x    Knee/Hip Exercises: Stretches   Gastroc Stretch Both;3 reps;20 seconds   Knee/Hip Exercises: Aerobic   Nustep L1 x 3 min   Knee/Hip Exercises: Supine   Bridges with Clamshell Strengthening;Both;1 set;10 reps  Blue band for clamshell   Knee/Hip Exercises: Sidelying   Hip ABduction AROM;Strengthening;Left;2 sets;5 reps   Iontophoresis   Type of Iontophoresis Dexamethasone   Location RT ant hip   Dose 1 ml  Skin intact   Time 6 hr wear                  PT Short Term Goals - 09/18/15 1215    PT SHORT  TERM GOAL #1   Title independent with initial HEP   Time 4   Period Weeks   Status Achieved   PT SHORT TERM GOAL #3   Title understand abdominal massage to reduce constipation to decreased strain on pelvic floor muscles by left hip   Time 4   Period Weeks   Status Achieved           PT Long Term Goals - 08/28/15 1345    PT LONG TERM GOAL #1   Title independent with HEP and understands how to progress HEP   Time 8   Period Weeks   Status New   PT LONG TERM GOAL #2   Title Left hip and Pelvic pain decreased >/= 50% with sitting, standing and walking   Time 8   Period Weeks   Status New   PT LONG TERM GOAL #3   Title left hip abduction strength is 5/5 so she is able to walk with reduced pain >/= 50%   Time 8   Period Weeks   Status New   PT LONG TERM GOAL #4   Title abdominal strength is 4/5 so she is able to stabilize her pelvic so she is able to sit with  pain decreased >/= 50%   Time 8   Period Weeks   Status New   PT LONG TERM GOAL #5   Title FOTO score is  34% limitation   Time 8   Period Weeks   Status New               Plan - 09/18/15 1238    Clinical Impression Statement Pt frustrated with her multiple areas of pain; at times anterior hip, at times SI joint, this weekend suffered through major LE cramps.  She continues with significant complaints of constipation as well.  We applied the ionto patch to the anterior hip today in hoipe of reducing anterior thigh symtpoms. Pt did demonstrate  improved hip abduction today.    Rehab Potential Good   PT Frequency 2x / week   PT Duration 8 weeks   PT Treatment/Interventions ADLs/Self Care Home Management;Cryotherapy;Electrical Stimulation;Traction;Ultrasound;Iontophoresis 4mg /ml Dexamethasone;Gait training;Functional mobility training;Therapeutic activities;Therapeutic exercise;Balance training;Patient/family education;Neuromuscular re-education;Manual techniques;Dry needling;Passive range of motion   PT Next Visit Plan Ionto #4, hip strength, core strength   Consulted and Agree with Plan of Care Patient      Patient will benefit from skilled therapeutic intervention in order to improve the following deficits and impairments:  Abnormal gait, Increased fascial restricitons, Pain, Increased muscle spasms, Decreased mobility, Decreased activity tolerance, Decreased endurance, Decreased range of motion, Decreased strength, Hypomobility, Difficulty walking  Visit Diagnosis: Pain in left hip Stiffness of left hip, not elsewhere classified Muscle weakness (generalized) Earlie Counts, PT 09/27/2015 10:42 AM      Problem List Patient Active Problem List   Diagnosis Date Noted  . Myofascial pain syndrome 07/28/2015  . Carpal tunnel syndrome 04/20/2015  . Labral tear of hip, degenerative 02/07/2015  . Adenopathy 10/26/2014  . Lumbar radiculopathy 05/04/2014  . Cubital tunnel syndrome on left 05/04/2014  . Plantar fascial fibromatosis 04/06/2014  . Left shoulder pain 02/17/2014  .  Hypersomnia, unspecified 01/11/2014  . Pes anserine bursitis 12/07/2012  . Right C8 Cervical radiculitis 08/26/2012  . Right shoulder pain 08/26/2012  . Pituitary microadenoma with hyperprolactinemia ([place]) 07/21/2012  . Irritable bowel syndrome 02/25/2008  . Depression 01/15/2008  . GERD 01/15/2008    COCHRAN,JENNIFER, PTA 09/18/2015, 12:43 PM  [facility] [address] [place], [place], [address] Phone: [phone]   Fax:  [phone]  Name: ATHLEEN HUGUNIN MRN: [id number] Date of Birth: Oct 11, 1975

## 2015-09-20 ENCOUNTER — Ambulatory Visit: Payer: Managed Care, Other (non HMO) | Admitting: Physical Therapy

## 2015-09-25 ENCOUNTER — Encounter: Payer: Managed Care, Other (non HMO) | Admitting: Physical Therapy

## 2015-09-27 ENCOUNTER — Encounter: Payer: Self-pay | Admitting: Physical Therapy

## 2015-09-27 ENCOUNTER — Ambulatory Visit: Payer: Managed Care, Other (non HMO) | Admitting: Physical Therapy

## 2015-09-27 DIAGNOSIS — M25552 Pain in left hip: Secondary | ICD-10-CM | POA: Diagnosis not present

## 2015-09-27 DIAGNOSIS — M6281 Muscle weakness (generalized): Secondary | ICD-10-CM

## 2015-09-27 DIAGNOSIS — M25652 Stiffness of left hip, not elsewhere classified: Secondary | ICD-10-CM

## 2015-09-27 NOTE — Therapy (Addendum)
[facility] [address] [place], [place], [address] Phone: [phone]   Fax:  [phone]  Physical Therapy Treatment  Patient Details  Name: Laura [name] MRN: [id number] Date of Birth: 08-05-1975 Referring Provider: Dr. Aundria Mems  Encounter Date: 09/27/2015      PT End of Session - 09/27/15 1319    Visit Number 6   Date for PT Re-Evaluation 10/25/15   Authorization Type Aetna   PT Start Time 1145   PT Stop Time 1230   PT Time Calculation (min) 45 min   Activity Tolerance Patient tolerated treatment well   Behavior During Therapy [facility] for tasks assessed/performed      Past Medical History  Diagnosis Date  . IBS (irritable bowel syndrome)     sees Dr. Eusebio Friendly in [place]   . Depression   . GERD (gastroesophageal reflux disease)   . Restless legs   . Insomnia   . Headache(784.0)   . UTI (lower urinary tract infection)     sees Dr Karsten Ro  . Anemia   . Anxiety   . Pituitary microadenoma with hyperprolactinemia [facility])     sees Dr. Mare Ferrari in [facility]    Past Surgical History  Procedure Laterality Date  . Tonsillectomy    . Abdominal hysterectomy  Nov 2008  . Neck surgery  JULY 1996    L side lymph node removal  . Wisdom tooth extraction    . Tubal ligation    . Esophagogastroduodenoscopy  08-01-14    per Dr. Cindee Salt, gastritis only   . Colonoscopy  08-01-14    per Dr. Cindee Salt, tubular adenoma, repeat in 5 yrs     There were no vitals filed for this visit.      Subjective Assessment - 09/27/15 1153    Subjective I am having more rouble with laying on left side and started over the weekend. When I drive or ride in car feel pain in left buttocks area.    Diagnostic tests MRI showed a labral tear.    Patient Stated Goals reduce pain   Currently in Pain? Yes   Pain Score 3    Pain Location Hip   Pain Orientation Left   Pain Descriptors / Indicators Tightness   Pain Type  Chronic pain   Pain Onset More than a month ago   Pain Frequency Intermittent   Aggravating Factors  walking, getting up and down from chair   Pain Relieving Factors watching movements   Effect of Pain on Daily Activities daily activities   Multiple Pain Sites No            [facility] PT Assessment - 09/27/15 0001    Assessment   Medical Diagnosis M16.9 Labral tear of hip, degenerative   Onset Date/Surgical Date 08/09/14   Prior Therapy two times   Precautions   Precautions Other (comment)   Precaution Comments Avoid postions of deep hip flexion    Restrictions   Weight Bearing Restrictions No   Balance Screen   Has the patient fallen in the past 6 months No   Has the patient had a decrease in activity level because of a fear of falling?  No   Is the patient reluctant to leave their home because of a fear of falling?  No   Home [employer] residence   Prior Function   Level of Independence Independent   Cognition   Overall Cognitive Status Within Functional Limits for  tasks assessed   Observation/Other Assessments   Focus on Therapeutic Outcomes (FOTO)  43% limitation CK  34% limitation goal CJ   Strength   Strength Assessment Site Hip   Right/Left Hip Left   Left Hip Flexion 4/5  pulling in front of hip   Left Hip Extension 4+/5   Left Hip External Rotation 4/5   Left Hip Internal Rotation 4/5   Left Hip ABduction 3+/5   Palpation   SI assessment  pelvis in correct alignment   Palpation comment tenderness located in anterior left hip and has lots pain       ultrasound 100%, 12mhz, 1.2 w/cm2, 8 min to left anterior hip for 8 min.  Earlie Counts, PT 10/05/2015 7:42 AM                [facility] Adult PT Treatment/Exercise - 09/27/15 0001    Iontophoresis   Type of Iontophoresis Dexamethasone   Location anterior left hip   Dose 1 ml  #3   Time 6 hour patch   Manual Therapy   Manual Therapy Soft tissue mobilization   Soft tissue  mobilization anteriorl left hip, rectus femoris                PT Education - 09/27/15 1319    Education provided No          PT Short Term Goals - 09/27/15 1157    PT SHORT TERM GOAL #1   Title independent with initial HEP   Time 4   Period Weeks   Status Achieved   PT SHORT TERM GOAL #2   Title pelvis stays in alignment for 3 weeks to reduce strain on left hip   Time 4   Period Weeks   Status Achieved   PT SHORT TERM GOAL #3   Title understand abdominal massage to reduce constipation to decreased strain on pelvic floor muscles by left hip   Time 4   Period Weeks   Status Achieved   PT SHORT TERM GOAL #4   Title understand how to sit on the commode to have a bowel movement to reduce constipation and strain on left hip   Time 4   Period Weeks   Status Achieved           PT Long Term Goals - 09/27/15 1159    PT LONG TERM GOAL #1   Title independent with HEP and understands how to progress HEP   Time 8   Period Weeks   Status On-going  still learning   PT LONG TERM GOAL #2   Title Left hip and Pelvic pain decreased >/= 50% with sitting, standing and walking   Time 8   Period Weeks   Status On-going  30% better   PT LONG TERM GOAL #5   Title FOTO score is 34% limitation   Time 8   Period Weeks   Status On-going               Plan - 09/27/15 1321    Clinical Impression Statement Patient continues to have pain in left hip and has improved by 30%.  Patient hip pain was improving until this weekend when it is increasing. Left hip strength is flexion 4/5, rotation 4/5, abduction 3+/5, and extension 4+/5 with pain.  Pelvis in correct alignment. Patient has difficulty with laying on her left side and sitting in her car.  Patient will benefit from physical therapy to reduce pain and increase pain.  Rehab Potential Good   Clinical Impairments Affecting Rehab Potential Labral tear on left hip;MD wants to avoid positions of deep hip flexion and focus  on hip abductor rehabiliation   PT Frequency 2x / week   PT Duration 4 weeks   PT Treatment/Interventions ADLs/Self Care Home Management;Cryotherapy;Electrical Stimulation;Traction;Ultrasound;Iontophoresis 4mg /ml Dexamethasone;Gait training;Functional mobility training;Therapeutic activities;Therapeutic exercise;Balance training;Patient/family education;Neuromuscular re-education;Manual techniques;Dry needling;Passive range of motion   PT Next Visit Plan Ionto #5, hip strength, core strength; pain modalities   PT Home Exercise Plan progress as needed   Consulted and Agree with Plan of Care Patient      Patient will benefit from skilled therapeutic intervention in order to improve the following deficits and impairments:  Abnormal gait, Increased fascial restricitons, Pain, Increased muscle spasms, Decreased mobility, Decreased activity tolerance, Decreased endurance, Decreased range of motion, Decreased strength, Hypomobility, Difficulty walking  Visit Diagnosis: Pain in left hip - Plan: PT plan of care cert/re-cert  Stiffness of left hip, not elsewhere classified - Plan: PT plan of care cert/re-cert  Muscle weakness (generalized) - Plan: PT plan of care cert/re-cert     Problem List Patient Active Problem List   Diagnosis Date Noted  . Myofascial pain syndrome 07/28/2015  . Carpal tunnel syndrome 04/20/2015  . Labral tear of hip, degenerative 02/07/2015  . Adenopathy 10/26/2014  . Lumbar radiculopathy 05/04/2014  . Cubital tunnel syndrome on left 05/04/2014  . Plantar fascial fibromatosis 04/06/2014  . Left shoulder pain 02/17/2014  . Hypersomnia, unspecified 01/11/2014  . Pes anserine bursitis 12/07/2012  . Right C8 Cervical radiculitis 08/26/2012  . Right shoulder pain 08/26/2012  . Pituitary microadenoma with hyperprolactinemia ([place]) 07/21/2012  . Irritable bowel syndrome 02/25/2008  . Depression 01/15/2008  . GERD 01/15/2008    Earlie Counts, PT 09/27/2015 1:27  PM   [facility] [address], [place], [place], [address] Phone: [phone]   Fax:  [phone]  Name: Laura [name] MRN: [id number] Date of Birth: 09/20/1975

## 2015-10-02 ENCOUNTER — Encounter: Payer: Managed Care, Other (non HMO) | Admitting: Physical Therapy

## 2015-10-04 ENCOUNTER — Encounter: Payer: Self-pay | Admitting: Physical Therapy

## 2015-10-04 ENCOUNTER — Ambulatory Visit: Payer: Managed Care, Other (non HMO) | Admitting: Physical Therapy

## 2015-10-04 DIAGNOSIS — M25552 Pain in left hip: Secondary | ICD-10-CM | POA: Diagnosis not present

## 2015-10-04 DIAGNOSIS — M25652 Stiffness of left hip, not elsewhere classified: Secondary | ICD-10-CM

## 2015-10-04 DIAGNOSIS — M6281 Muscle weakness (generalized): Secondary | ICD-10-CM

## 2015-10-04 NOTE — Therapy (Addendum)
[facility] [address] [place], [place], [address] Phone: [phone]   Fax:  [phone]  Physical Therapy Treatment  Patient Details  Name: Laura [name] MRN: [id number] Date of Birth: 05-17-1976 Referring Provider: Dr. Aundria Mems  Encounter Date: 10/04/2015      PT End of Session - 10/04/15 1227    Visit Number 7   Date for PT Re-Evaluation 10/25/15   Authorization Type Aetna   PT Start Time 1145   PT Stop Time 1227   PT Time Calculation (min) 42 min   Activity Tolerance Patient tolerated treatment well   Behavior During Therapy [facility] for tasks assessed/performed      Past Medical History  Diagnosis Date  . IBS (irritable bowel syndrome)     sees Dr. Eusebio Friendly in [place]   . Depression   . GERD (gastroesophageal reflux disease)   . Restless legs   . Insomnia   . Headache(784.0)   . UTI (lower urinary tract infection)     sees Dr Karsten Ro  . Anemia   . Anxiety   . Pituitary microadenoma with hyperprolactinemia [facility])     sees Dr. Mare Ferrari in [facility]    Past Surgical History  Procedure Laterality Date  . Tonsillectomy    . Abdominal hysterectomy  Nov 2008  . Neck surgery  JULY 1996    L side lymph node removal  . Wisdom tooth extraction    . Tubal ligation    . Esophagogastroduodenoscopy  08-01-14    per Dr. Cindee Salt, gastritis only   . Colonoscopy  08-01-14    per Dr. Cindee Salt, tubular adenoma, repeat in 5 yrs     There were no vitals filed for this visit.      Subjective Assessment - 10/04/15 1151    Subjective Last time it helped but I was sore. the ultrasound helped.    Diagnostic tests MRI showed a labral tear.    Patient Stated Goals reduce pain   Currently in Pain? Yes   Pain Score 2    Pain Location Hip   Pain Orientation Left   Pain Descriptors / Indicators Tightness   Pain Type Chronic pain   Pain Onset More than a month ago   Pain Frequency Intermittent    Aggravating Factors  walking, getting u pand down from chair   Pain Relieving Factors watching movements.   Effect of Pain on Daily Activities daily activities   Multiple Pain Sites No                         [facility] Adult PT Treatment/Exercise - 10/04/15 0001    Lumbar Exercises: Prone   Other Prone Lumbar Exercises bil. hip abduction 10x2;     Other Prone Lumbar Exercises bil. hip ER/IR with red band 10x2 bil.    Knee/Hip Exercises: Prone   Hip Extension Strengthening;Left;10 reps  2 sets   Modalities   Modalities Ultrasound   Ultrasound   Ultrasound Location anterior left hip   Ultrasound Parameters 100% , 1.2 w/cm2, 1 mhz, 8 min, 100%   Ultrasound Goals Pain   Manual Therapy   Manual Therapy Soft tissue mobilization   Soft tissue mobilization anteriorl left hip, rectus femoris                PT Education - 10/04/15 1227    Education provided No          PT Short Term Goals -  09/27/15 1157    PT SHORT TERM GOAL #1   Title independent with initial HEP   Time 4   Period Weeks   Status Achieved   PT SHORT TERM GOAL #2   Title pelvis stays in alignment for 3 weeks to reduce strain on left hip   Time 4   Period Weeks   Status Achieved   PT SHORT TERM GOAL #3   Title understand abdominal massage to reduce constipation to decreased strain on pelvic floor muscles by left hip   Time 4   Period Weeks   Status Achieved   PT SHORT TERM GOAL #4   Title understand how to sit on the commode to have a bowel movement to reduce constipation and strain on left hip   Time 4   Period Weeks   Status Achieved           PT Long Term Goals - 10/04/15 1230    PT LONG TERM GOAL #1   Title independent with HEP and understands how to progress HEP   Period Weeks   Status On-going   PT LONG TERM GOAL #2   Title Left hip and Pelvic pain decreased >/= 50% with sitting, standing and walking   Time 8   Period Weeks   Status On-going   PT LONG TERM GOAL #3    Title left hip abduction strength is 5/5 so she is able to walk with reduced pain >/= 50%   Time 8   Period Weeks   Status New   PT LONG TERM GOAL #4   Title abdominal strength is 4/5 so she is able to stabilize her pelvic so she is able to sit with  pain decreased >/= 50%   Time 8   Period Weeks   Status On-going   PT LONG TERM GOAL #5   Title FOTO score is 34% limitation   Time 8   Period Weeks   Status On-going               Plan - 10/04/15 1228    Clinical Impression Statement Patient felt better with ultrasound.  Patient able to exercise left hip in prone better with less compensation.  Patient will be having vien and eye surgery next week and will not  come to therapy.  Patient will benefit from physical therapy to redue pain and incresae strength.    Rehab Potential Good   Clinical Impairments Affecting Rehab Potential Labral tear on left hip;MD wants to avoid positions of deep hip flexion and focus on hip abductor rehabiliation   PT Frequency 2x / week   PT Duration 4 weeks   PT Treatment/Interventions ADLs/Self Care Home Management;Cryotherapy;Electrical Stimulation;Traction;Ultrasound;Iontophoresis 81m/ml Dexamethasone;Gait training;Functional mobility training;Therapeutic activities;Therapeutic exercise;Balance training;Patient/family education;Neuromuscular re-education;Manual techniques;Dry needling;Passive range of motion   PT Next Visit Plan Ionto #5, hip strength in prone, core strength; pain modalities   PT Home Exercise Plan progress as needed   Consulted and Agree with Plan of Care Patient      Patient will benefit from skilled therapeutic intervention in order to improve the following deficits and impairments:  Abnormal gait, Increased fascial restricitons, Pain, Increased muscle spasms, Decreased mobility, Decreased activity tolerance, Decreased endurance, Decreased range of motion, Decreased strength, Hypomobility, Difficulty walking  Visit  Diagnosis: Pain in left hip  Stiffness of left hip, not elsewhere classified  Muscle weakness (generalized)     Problem List Patient Active Problem List   Diagnosis Date Noted  . Myofascial pain  syndrome 07/28/2015  . Carpal tunnel syndrome 04/20/2015  . Labral tear of hip, degenerative 02/07/2015  . Adenopathy 10/26/2014  . Lumbar radiculopathy 05/04/2014  . Cubital tunnel syndrome on left 05/04/2014  . Plantar fascial fibromatosis 04/06/2014  . Left shoulder pain 02/17/2014  . Hypersomnia, unspecified 01/11/2014  . Pes anserine bursitis 12/07/2012  . Right C8 Cervical radiculitis 08/26/2012  . Right shoulder pain 08/26/2012  . Pituitary microadenoma with hyperprolactinemia ([place]) 07/21/2012  . Irritable bowel syndrome 02/25/2008  . Depression 01/15/2008  . GERD 01/15/2008    Earlie Counts, PT 10/04/2015 12:32 PM   [facility] [address], [place], [place], [address] Phone: [phone]   Fax:  [phone]  Name: Laura [name] MRN: [id number] Date of Birth: 12-28-1975 PHYSICAL THERAPY DISCHARGE SUMMARY  Visits from Start of Care: 8  Current functional level related to goals / functional outcomes: See above.    Remaining deficits: See above. Patient did not return since last visit. Patient continued to have pain in left hip.    Education / Equipment: HEP  Plan: Patient agrees to discharge.  Patient goals were not met. Patient is being discharged due to not returning since the last visit. Thank you for the referral. Earlie Counts, PT 10/30/2015 12:47 PM   ?????

## 2015-10-06 ENCOUNTER — Encounter: Payer: Self-pay | Admitting: Adult Health

## 2015-10-06 ENCOUNTER — Ambulatory Visit (INDEPENDENT_AMBULATORY_CARE_PROVIDER_SITE_OTHER): Payer: Managed Care, Other (non HMO) | Admitting: Adult Health

## 2015-10-06 ENCOUNTER — Telehealth: Payer: Self-pay | Admitting: Family Medicine

## 2015-10-06 VITALS — BP 110/70 | HR 76 | Temp 98.1°F | Ht 68.0 in | Wt 168.9 lb

## 2015-10-06 DIAGNOSIS — R51 Headache: Secondary | ICD-10-CM | POA: Diagnosis not present

## 2015-10-06 DIAGNOSIS — R519 Headache, unspecified: Secondary | ICD-10-CM

## 2015-10-06 LAB — C-REACTIVE PROTEIN

## 2015-10-06 LAB — SEDIMENTATION RATE: SED RATE: 4 mm/h (ref 0–22)

## 2015-10-06 NOTE — Patient Instructions (Signed)
It was great meeting you today!  I am sorry that we do not have an answer for your scalp pain.   I will follow up with you regarding your blood work   Follow up with your PCP if the pain continues

## 2015-10-06 NOTE — Telephone Encounter (Signed)
Patient Name: Laura [name] DOB: December 01, 1975 Initial Comment caller states she has a sore spot on her head Nurse Assessment Nurse: Vallery Sa, RN, [place] Date/Time (Eastern Time): 10/06/2015 1:20:05 PM Confirm and document reason for call. If symptomatic, describe symptoms. You must click the next button to save text entered. ---Caller states she developed an abscess the size of a quarter in her scalp yesterday. No fever. No bleeding or drainage. Has the patient traveled out of the country within the last 30 days? ---No Does the patient have any new or worsening symptoms? ---Yes Will a triage be completed? ---Yes Related visit to physician within the last 2 weeks? ---No Does the PT have any chronic conditions? (i.e. diabetes, asthma, etc.) ---Yes List chronic conditions. ---IBS, Pituitary Tumor, Shingles and Flu in Jan 2017, Thyroid problems Is the patient pregnant or possibly pregnant? (Ask all females between the ages of 81-55) ---No Is this a behavioral health or substance abuse call? ---No Guidelines Guideline Title Affirmed Question Affirmed Notes Boil (Skin Abscess) [1] Boil AND [2] diabetes mellitus or weak immune system (e.g., HIV positive, cancer chemotherapy, transplant patient) Final Disposition User See Physician within 24 Hours [place], RN, International aid/development worker Comments Scheduled for 3:15pm today with [employer]. Referrals REFERRED TO PCP OFFICE Disagree/Comply: Comply

## 2015-10-06 NOTE — Progress Notes (Signed)
   Subjective:    Patient ID: Laura [name], female    DOB: [birth year], 40 y.o.   MRN: [id number]  HPI  40 year old female who presents to the office today for pain in the front of her head ( area where she parts her hair). She has this for two days. The pain is described as a " bruised feeling." She has not noticed any lumps, bumps, masses. Pain is worse when she tries to comb her hair.   She denies any trauma to the area or blurred vision.    Review of Systems  All other systems reviewed and are negative.      Objective:   Physical Exam  Constitutional: She is oriented to person, place, and time. She appears well-developed and well-nourished. No distress.  HENT:  No rash, irritation, bruising, vesicles, or edema on or around area in question.  No TMJ  Eyes: Conjunctivae and EOM are normal. Pupils are equal, round, and reactive to light.  Musculoskeletal: Normal range of motion.  Neurological: She is alert and oriented to person, place, and time.  Skin: Skin is warm and dry. No rash noted. She is not diaphoretic. No erythema. No pallor.  Psychiatric: She has a normal mood and affect. Her behavior is normal. Judgment and thought content normal.  Nursing note and vitals reviewed.      Assessment & Plan:  1. Scalp pain - Unknown origin of scalp pain. I doubt it is temporal arteritis but I cannot rule it out.  - C-reactive Protein - Sedimentation Rate  - Nsaids for pain relief.  - Follow up with PCP with continued pain   Dorothyann Peng, NP

## 2015-10-06 NOTE — Progress Notes (Signed)
Pre visit review using our clinic review tool, if applicable. No additional management support is needed unless otherwise documented below in the visit note. 

## 2015-10-06 NOTE — Telephone Encounter (Signed)
Noted  

## 2015-10-09 ENCOUNTER — Encounter: Payer: Managed Care, Other (non HMO) | Admitting: Physical Therapy

## 2015-10-11 ENCOUNTER — Encounter: Payer: Managed Care, Other (non HMO) | Admitting: Physical Therapy

## 2015-10-16 ENCOUNTER — Ambulatory Visit: Payer: Managed Care, Other (non HMO) | Admitting: Physical Therapy

## 2015-10-18 ENCOUNTER — Encounter: Payer: Managed Care, Other (non HMO) | Admitting: Physical Therapy

## 2015-10-19 ENCOUNTER — Ambulatory Visit: Payer: Managed Care, Other (non HMO) | Admitting: Sports Medicine

## 2015-10-30 ENCOUNTER — Encounter: Payer: Self-pay | Admitting: Family Medicine

## 2015-10-30 ENCOUNTER — Ambulatory Visit (INDEPENDENT_AMBULATORY_CARE_PROVIDER_SITE_OTHER): Payer: Managed Care, Other (non HMO) | Admitting: Family Medicine

## 2015-10-30 VITALS — BP 102/80 | HR 73 | Wt 174.1 lb

## 2015-10-30 DIAGNOSIS — E039 Hypothyroidism, unspecified: Secondary | ICD-10-CM

## 2015-10-30 DIAGNOSIS — K5900 Constipation, unspecified: Secondary | ICD-10-CM

## 2015-10-30 DIAGNOSIS — E538 Deficiency of other specified B group vitamins: Secondary | ICD-10-CM | POA: Diagnosis not present

## 2015-10-30 LAB — TSH: TSH: 1.71 u[IU]/mL (ref 0.35–4.50)

## 2015-10-30 LAB — VITAMIN B12: VITAMIN B 12: 1241 pg/mL — AB (ref 211–911)

## 2015-10-30 LAB — T3, FREE: T3, Free: 2.9 pg/mL (ref 2.3–4.2)

## 2015-10-30 LAB — T4, FREE: Free T4: 0.83 ng/dL (ref 0.60–1.60)

## 2015-10-30 NOTE — Progress Notes (Signed)
   Subjective:    Patient ID: Laura [name], female    DOB: 05/11/1976, 40 y.o.   MRN: [id number]  HPI Here with some questions. First she has been very constipated lately and now has not had a BM in 4 days. She sees Dr. Cindee Salt for GI care, and she is currently taking Linzess along with Miralax daily. She used to take a probiotic but she stopped this a few months ago. Also she asks me to review some lab results obtained during a visit with her endocrinologist, Dr. Mare Ferrari, last week. She had a B12 level of "over 2000" and she had a normal TSH and a normal free T4. Her Synthroid was increased from 25 mcg to 50 mcg last December, but interestingly her TSH doubled from 1 to 2. Apparently a free T3 has never been checked. She feels tired and sluggish and she has gained 17 lbs in the past 6 months. She takes an OTC B12 supplement daily but she has not had shots for several years.    Review of Systems  Constitutional: Positive for fatigue and unexpected weight change.  Respiratory: Negative.   Cardiovascular: Negative.   Gastrointestinal: Positive for constipation and abdominal distention. Negative for nausea, vomiting, abdominal pain, diarrhea, blood in stool, anal bleeding and rectal pain.  Genitourinary: Negative.   Neurological: Negative.        Objective:   Physical Exam  Constitutional: She is oriented to person, place, and time. She appears well-developed and well-nourished. No distress.  Neck: No thyromegaly present.  Cardiovascular: Normal rate, regular rhythm, normal heart sounds and intact distal pulses.   Pulmonary/Chest: Effort normal and breath sounds normal.  Abdominal: Soft. Bowel sounds are normal. She exhibits no distension and no mass. There is no tenderness. There is no rebound and no guarding.  Lymphadenopathy:    She has no cervical adenopathy.  Neurological: She is alert and oriented to person, place, and time.          Assessment & Plan:  As for the constipation, I  suggested she get back on the daily probiotic. Also she may try MOM one tbsp TID in addition to the other meds. She should follow up with GI soon. For the high b12 level. I can only imagine this was an erroneous result. We will repeat a B12  Level today. For the thyroid issue, her fatigue and weight gain would certainly suggest she is still hypothyroid. We will get a full thyroid panel today including a free T3 to see where she is.  Laurey Morale, MD

## 2015-12-09 ENCOUNTER — Other Ambulatory Visit: Payer: Self-pay | Admitting: Family Medicine

## 2015-12-11 NOTE — Telephone Encounter (Signed)
Refill sent to pharmacy.   

## 2015-12-12 ENCOUNTER — Other Ambulatory Visit: Payer: Self-pay | Admitting: Family Medicine

## 2015-12-13 NOTE — Telephone Encounter (Signed)
Can we refill this? 

## 2016-02-20 ENCOUNTER — Ambulatory Visit (INDEPENDENT_AMBULATORY_CARE_PROVIDER_SITE_OTHER): Payer: Managed Care, Other (non HMO) | Admitting: Sports Medicine

## 2016-02-20 ENCOUNTER — Encounter: Payer: Self-pay | Admitting: Sports Medicine

## 2016-02-20 DIAGNOSIS — M609 Myositis, unspecified: Secondary | ICD-10-CM | POA: Diagnosis not present

## 2016-02-20 DIAGNOSIS — M797 Fibromyalgia: Secondary | ICD-10-CM | POA: Diagnosis not present

## 2016-02-20 DIAGNOSIS — IMO0001 Reserved for inherently not codable concepts without codable children: Secondary | ICD-10-CM

## 2016-02-20 DIAGNOSIS — M791 Myalgia: Secondary | ICD-10-CM | POA: Diagnosis not present

## 2016-02-20 DIAGNOSIS — M5416 Radiculopathy, lumbar region: Secondary | ICD-10-CM

## 2016-02-20 MED ORDER — MILNACIPRAN HCL 12.5 & 25 & 50 MG PO MISC: ORAL | 0 refills | Status: DC

## 2016-02-20 NOTE — Assessment & Plan Note (Signed)
Savella, which should help her depression is well.  Return in 1 month.

## 2016-02-20 NOTE — Assessment & Plan Note (Addendum)
Pain right at the right SI joint. Injected today, return in one month.  This was for diagnostic and therapeutic purposes. If persistent pain we may discuss and try a lumbar spine MRI.

## 2016-02-20 NOTE — Progress Notes (Addendum)
Subjective:    CC: bilateral hip pain        HPI: 40 yo with history of L trochanteric bursitis, labral tear, and SI joint pain presenting for re-evaluation of hip pain.  Patient says she has had bilateral hip pain for months that has been persistent since her last appointment in April.  At that appointment, she was put on Lyrica 25mg  because Lyrica 75 mg made her feel funny.  She says Lyrica 25mg  also made her feel "cognitively slow - like I couldn't focus on anything", so she discontinued it months ago.  She currently takes Tylenol for pain with little relief.  She states pain is in the back of her hips, R > L, and radiates down her buttocks and lateral quadriceps.  It does not radiate past her knees.  She says pain is constant, but worse when getting up in the morning, taking the first few steps after sitting down, sitting down when crossing her legs, and worse at night.  She is tired of always being in pain and is hoping to discuss long-term prognosis of her pain.  She is amenable to steroid injections but wants to know how long she can expect injections to help.  She also mentions that she was worked up for leukocytopenia by a hematologist recently, but nothing of concern was found during that workup.   Past medical history, Surgical history, Family history not pertinant except as noted below, Social history, Allergies, and medications have been entered into the medical record, reviewed, and no changes needed.   Review of Systems: No fevers, chills, night sweats, weight loss, chest pain, or shortness of breath.   Objective:    General: Well Developed, well nourished, and in no acute distress.  Neuro: Alert and oriented x3, extra-ocular muscles intact, sensation grossly intact.  HEENT: Normocephalic, atraumatic, pupils equal round reactive to light, neck supple, no masses, no lymphadenopathy, thyroid nonpalpable.  Skin: Warm and dry, no rashes. Cardiac: Regular rate and rhythm, no murmurs rubs  or gallops, no lower extremity edema.  Respiratory: Clear to auscultation bilaterally. Not using accessory muscles, speaking in full sentences. R Hip: ROM IR: 45 Deg, ER: 45 Deg, Flexion: 120 Deg, Extension: 100 Deg, Abduction: 45 Deg, Adduction: 45 Deg Strength IR: 5/5, ER: 5/5, Flexion: 5/5, Extension: 5/5, Abduction: 5/5, Adduction: 5/5 Pelvic alignment unremarkable to inspection and palpation. Standing hip rotation and gait without trendelenburg sign / unsteadiness. Greater trochanter with tenderness to palpation, with even light palpation. No tenderness over piriformis. Pain with FABER or FADIR. SI joint tenderness and normal minimal SI movement. L Hip: ROM IR: 45 Deg, ER: 45 Deg, Flexion: 120 Deg, Extension: 100 Deg, Abduction: 45 Deg, Adduction: 45 Deg Strength IR: 5/5, ER: 5/5, Flexion: 5/5, Extension: 5/5, Abduction: 5/5, Adduction: 5/5 Pelvic alignment unremarkable to inspection and palpation. Standing hip rotation and gait without trendelenburg sign / unsteadiness. Greater trochanter without tenderness to palpation. No tenderness over piriformis. Pain with FABER or FADIR. SI joint tenderness and normal minimal SI movement.  Procedure: Real-time Ultrasound Guided Injection of right SI joint Device: GE Logiq E  Verbal informed consent obtained.  Time-out conducted.  Noted no overlying erythema, induration, or other signs of local infection.  Skin prepped in a sterile fashion.  Local anesthesia: Topical Ethyl chloride.  With sterile technique and under real time ultrasound guidance:  22-gauge spinal needle advanced into the SI joint taking care to avoid the S1 foramen. 1 mL kenalog 40, 2 mL lidocaine, 2 mL Marcaine  injected easily. Completed without difficulty  Pain immediately resolved suggesting accurate placement of the medication.  Advised to call if fevers/chills, erythema, induration, drainage, or persistent bleeding.  Images permanently stored and available for  review in the ultrasound unit.  Impression: Technically successful ultrasound guided injection. Impression and Recommendations:    1. Myofascial pain syndrome: PHQ-9 score of 5  -Savella prescription -follow-up in 1 month, will consider L-spine MRI if not better at that time  2. Likely bilateral sacroiiliitis  -will inject R hip today.  Resolution of pain will confirm sacroiilitis as secondary cause of hip pain, in addition to myofascial pain syndrome -follow-up in 1 month   I have seen and examined the below patient, I agree with the med student's findings, assessment, and plan. ___________________________________________ Gwen Her. Dianah Field, M.D., ABFM., CAQSM. Primary Care and [place] Instructor of [employer] [facility] [employer] [facility]

## 2016-03-19 ENCOUNTER — Ambulatory Visit (INDEPENDENT_AMBULATORY_CARE_PROVIDER_SITE_OTHER): Payer: Managed Care, Other (non HMO) | Admitting: Sports Medicine

## 2016-03-19 ENCOUNTER — Encounter: Payer: Self-pay | Admitting: Sports Medicine

## 2016-03-19 DIAGNOSIS — M797 Fibromyalgia: Secondary | ICD-10-CM | POA: Diagnosis not present

## 2016-03-19 DIAGNOSIS — M5416 Radiculopathy, lumbar region: Secondary | ICD-10-CM | POA: Diagnosis not present

## 2016-03-19 NOTE — Progress Notes (Signed)
  Subjective:    CC:  Follow-up  HPI: This is a pleasant 40 year old female with  SI joint dysfunction and myofascial pain syndrome.we injected her right SI joint at the last visit, she had a good response with good resolution of SI joint as well as radiating right thigh pain, unfortunately her back pain has recurred, axial, discogenic, worse with sitting, flexion, Valsalva. She also has not yet started her Savella due to fear.  Past medical history:  Negative.  See flowsheet/record as well for more information.  Surgical history: Negative.  See flowsheet/record as well for more information.  Family history: Negative.  See flowsheet/record as well for more information.  Social history: Negative.  See flowsheet/record as well for more information.  Allergies, and medications have been entered into the medical record, reviewed, and no changes needed.   Review of Systems: No fevers, chills, night sweats, weight loss, chest pain, or shortness of breath.   Objective:    General: Well Developed, well nourished, and in no acute distress.  Neuro: Alert and oriented x3, extra-ocular muscles intact, sensation grossly intact.  HEENT: Normocephalic, atraumatic, pupils equal round reactive to light, neck supple, no masses, no lymphadenopathy, thyroid nonpalpable.  Skin: Warm and dry, no rashes. Cardiac: Regular rate and rhythm, no murmurs rubs or gallops, no lower extremity edema.  Respiratory: Clear to auscultation bilaterally. Not using accessory muscles, speaking in full sentences.  Impression and Recommendations:    Lumbar radiculopathy Did well after a right SI joint injection at the last visit. Now having recurrence of pain, axial and discogenic. Right thigh pain has resolved suggesting that this was referred from the SI joint. I have recommended that she start her Savella before we proceed with lumbar spine MRI.  I do think that she has some protruding discs and discogenic  pain.  Fibromyalgia Did well after a right SI joint injection at the last visit. Now having recurrence of pain, axial and discogenic. Right thigh pain has resolved suggesting that this was referred from the SI joint. I have recommended that she start her Savella before we proceed with lumbar spine MRI.  I do think that she has some protruding discs and discogenic pain.  I spent 25 minutes with this patient, greater than 50% was face-to-face time counseling regarding the above diagnoses

## 2016-03-19 NOTE — Assessment & Plan Note (Signed)
Did well after a right SI joint injection at the last visit. Now having recurrence of pain, axial and discogenic. Right thigh pain has resolved suggesting that this was referred from the SI joint. I have recommended that she start her Savella before we proceed with lumbar spine MRI.  I do think that she has some protruding discs and discogenic pain.

## 2016-03-19 NOTE — Assessment & Plan Note (Signed)
Did well after a right SI joint injection at the last visit. Now having recurrence of pain, axial and discogenic. Right thigh pain has resolved suggesting that this was referred from the SI joint. I have recommended that Laura [name] start her Laura [name] before we proceed with lumbar spine MRI.  I do think that Laura [name] has some protruding discs and discogenic pain.

## 2016-04-04 ENCOUNTER — Ambulatory Visit (INDEPENDENT_AMBULATORY_CARE_PROVIDER_SITE_OTHER): Payer: Managed Care, Other (non HMO) | Admitting: Family Medicine

## 2016-04-04 ENCOUNTER — Encounter: Payer: Self-pay | Admitting: Family Medicine

## 2016-04-04 VITALS — BP 104/80 | HR 88 | Temp 98.2°F | Ht 68.0 in | Wt 172.0 lb

## 2016-04-04 DIAGNOSIS — R110 Nausea: Secondary | ICD-10-CM

## 2016-04-04 DIAGNOSIS — N200 Calculus of kidney: Secondary | ICD-10-CM | POA: Insufficient documentation

## 2016-04-04 NOTE — Progress Notes (Signed)
   Subjective:    Patient ID: Laura [name], female    DOB: Jun 14, 1975, 40 y.o.   MRN: [id number]  HPI Here asking advice about several issues. She still sees her GI specialist in [place], and she recently had an abdominal US which was totally normal except for the incidental finding of a 3 mm stone in the right kidney. She was told to ask her PCP about this. She has never had a kidney stone to her knowledge. Also she had been taking Linzess and Miralax daily for over a year and had done well. However about 4 weeks ago she developed some daily nausea and her stools have become very loose. She has not vomited but her appetite is decreased. No fever. No urinary problems. She was switched from [place] to Amitiza about 2 weeks ago but this has not helped. She was also told to stop using Miralax 2 weeks ago. She has also been seen by a hematologist recently for anemia, and she was told that the liver tests that were part of recent lab work were normal.    Review of Systems  Constitutional: Positive for appetite change. Negative for chills, diaphoresis and fever.  Respiratory: Negative.   Cardiovascular: Negative.   Gastrointestinal: Positive for nausea. Negative for abdominal distention, abdominal pain, anal bleeding, blood in stool, rectal pain and vomiting.  Genitourinary: Negative.        Objective:   Physical Exam  Constitutional: She is oriented to person, place, and time. She appears well-developed and well-nourished. No distress.  Neck: No thyromegaly present.  Cardiovascular: Normal rate, regular rhythm, normal heart sounds and intact distal pulses.   Pulmonary/Chest: Effort normal and breath sounds normal.  Abdominal: Soft. Bowel sounds are normal. She exhibits no distension and no mass. There is no tenderness. There is no rebound and no guarding.  Musculoskeletal: She exhibits no edema.  Lymphadenopathy:    She has no cervical adenopathy.  Neurological: She is alert and oriented  to person, place, and time.          Assessment & Plan:  It is not clear what the etiology of her recent nausea and loose stools might be. She already takes a probiotic daily. I suggested she start back on Miralax every day along with the Amitiza. As for the kidney stone, I reassured her that this was not causing any problems right now. I would guess she can pass it easily when the time comes for it to move. She should avoid taking excess calcium and she should drink plenty of water every day.  Laurey Morale, MD

## 2016-04-04 NOTE — Progress Notes (Signed)
Pre visit review using our clinic review tool, if applicable. No additional management support is needed unless otherwise documented below in the visit note. 

## 2016-04-18 ENCOUNTER — Other Ambulatory Visit: Payer: Self-pay

## 2016-04-18 ENCOUNTER — Ambulatory Visit (INDEPENDENT_AMBULATORY_CARE_PROVIDER_SITE_OTHER): Payer: Managed Care, Other (non HMO) | Admitting: Sports Medicine

## 2016-04-18 ENCOUNTER — Encounter: Payer: Self-pay | Admitting: Sports Medicine

## 2016-04-18 DIAGNOSIS — M5416 Radiculopathy, lumbar region: Secondary | ICD-10-CM | POA: Diagnosis not present

## 2016-04-18 DIAGNOSIS — M797 Fibromyalgia: Secondary | ICD-10-CM

## 2016-04-18 MED ORDER — MILNACIPRAN HCL 100 MG PO TABS: 100.0000 mg | ORAL_TABLET | Freq: Two times a day (BID) | ORAL | 3 refills | Status: DC

## 2016-04-18 MED ORDER — VITAMIN D (ERGOCALCIFEROL) 1.25 MG (50000 UNIT) PO CAPS: 50000.0000 [IU] | ORAL_CAPSULE | ORAL | 3 refills | Status: DC

## 2016-04-18 NOTE — Assessment & Plan Note (Signed)
Slight improvement in the symptoms. Refilling Savella, and increasing to 100 mg.

## 2016-04-18 NOTE — Progress Notes (Signed)
  Subjective:    CC: Follow-up  HPI: Fibromyalgia: Improved with low-dose Savella. Agreeable to go up on dose.  Right lumbar radiculopathy: Right L5 radiculopathy, persistent pain despite rehabilitation, SI joint injections have been somewhat beneficial into the SI joint.  Past medical history:  Negative.  See flowsheet/record as well for more information.  Surgical history: Negative.  See flowsheet/record as well for more information.  Family history: Negative.  See flowsheet/record as well for more information.  Social history: Negative.  See flowsheet/record as well for more information.  Allergies, and medications have been entered into the medical record, reviewed, and no changes needed.   Review of Systems: No fevers, chills, night sweats, weight loss, chest pain, or shortness of breath.   Objective:    General: Well Developed, well nourished, and in no acute distress.  Neuro: Alert and oriented x3, extra-ocular muscles intact, sensation grossly intact.  HEENT: Normocephalic, atraumatic, pupils equal round reactive to light, neck supple, no masses, no lymphadenopathy, thyroid nonpalpable.  Skin: Warm and dry, no rashes. Cardiac: Regular rate and rhythm, no murmurs rubs or gallops, no lower extremity edema.  Respiratory: Clear to auscultation bilaterally. Not using accessory muscles, speaking in full sentences.  Impression and Recommendations:    Fibromyalgia Slight improvement in the symptoms. Refilling Savella, and increasing to 100 mg.  Lumbar radiculopathy Persistent right-sided L5 type radiculopathy. At this point she's failed conservative measures and we are going to proceed with an MRI. She did well after a right SI joint injection 2 months ago. I still think her pain is due to protruding discs and is discogenic. Certainly next month we can proceed with another SI joint injection if she gets good relief again, and if she gets good relief again we can discuss SI joint  radiofrequency ablation.   I spent 25 minutes with this patient, greater than 50% was face-to-face time counseling regarding the above diagnoses

## 2016-04-18 NOTE — Assessment & Plan Note (Signed)
Persistent right-sided L5 type radiculopathy. At this point she's failed conservative measures and we are going to proceed with an MRI. She did well after a right SI joint injection 2 months ago. I still think her pain is due to protruding discs and is discogenic. Certainly next month we can proceed with another SI joint injection if she gets good relief again, and if she gets good relief again we can discuss SI joint radiofrequency ablation.

## 2016-04-19 ENCOUNTER — Other Ambulatory Visit: Payer: Self-pay

## 2016-04-29 ENCOUNTER — Ambulatory Visit (INDEPENDENT_AMBULATORY_CARE_PROVIDER_SITE_OTHER): Payer: Managed Care, Other (non HMO)

## 2016-04-29 DIAGNOSIS — M5116 Intervertebral disc disorders with radiculopathy, lumbar region: Secondary | ICD-10-CM

## 2016-04-29 DIAGNOSIS — M5416 Radiculopathy, lumbar region: Secondary | ICD-10-CM

## 2016-05-12 ENCOUNTER — Encounter: Payer: Self-pay | Admitting: Sports Medicine

## 2016-06-28 ENCOUNTER — Other Ambulatory Visit: Payer: Self-pay | Admitting: Family Medicine

## 2016-12-10 ENCOUNTER — Ambulatory Visit (INDEPENDENT_AMBULATORY_CARE_PROVIDER_SITE_OTHER): Payer: Commercial Managed Care - PPO | Admitting: Sports Medicine

## 2016-12-10 DIAGNOSIS — M7661 Achilles tendinitis, right leg: Secondary | ICD-10-CM | POA: Diagnosis not present

## 2016-12-10 DIAGNOSIS — M7062 Trochanteric bursitis, left hip: Secondary | ICD-10-CM

## 2016-12-10 MED ORDER — NITROGLYCERIN 0.2 MG/HR TD PT24: MEDICATED_PATCH | TRANSDERMAL | 11 refills | Status: DC

## 2016-12-10 NOTE — Progress Notes (Signed)
   Subjective:    I'm seeing this patient as a consultation for:  Dr. [name]  CC: Achilles and hip pain  HPI: Right Achilles: Has been walking more recently, unfortunately has noted an increasing painful lump over the posterior aspect of her mid Achilles tendon. Moderate, persistent without radiation.  Left hip pain: Localized over the greater trochanteric bursa, moderate, persistent, painful with laying on the ipsilateral side, NSAIDs have been ineffective.  Past medical history:  Negative.  See flowsheet/record as well for more information.  Surgical history: Negative.  See flowsheet/record as well for more information.  Family history: Negative.  See flowsheet/record as well for more information.  Social history: Negative.  See flowsheet/record as well for more information.  Allergies, and medications have been entered into the medical record, reviewed, and no changes needed.   Review of Systems: No headache, visual changes, nausea, vomiting, diarrhea, constipation, dizziness, abdominal pain, skin rash, fevers, chills, night sweats, weight loss, swollen lymph nodes, body aches, joint swelling, muscle aches, chest pain, shortness of breath, mood changes, visual or auditory hallucinations.   Objective:   General: Well Developed, well nourished, and in no acute distress.  Neuro/Psych: Alert and oriented x3, extra-ocular muscles intact, able to move all 4 extremities, sensation grossly intact. Skin: Warm and dry, no rashes noted.  Respiratory: Not using accessory muscles, speaking in full sentences, trachea midline.  Cardiovascular: Pulses palpable, no extremity edema. Abdomen: Does not appear distended. Left Hip: ROM IR: 60 Deg, ER: 60 Deg, Flexion: 120 Deg, Extension: 100 Deg, Abduction: 45 Deg, Adduction: 45 Deg Strength IR: 5/5, ER: 5/5, Flexion: 5/5, Extension: 5/5, Abduction: 5/5, Adduction: 5/5 Pelvic alignment unremarkable to inspection and palpation. Standing hip  rotation and gait without trendelenburg / unsteadiness. Greater trochanter with tenderness to palpation. No tenderness over piriformis. No SI joint tenderness and normal minimal SI movement.  Procedure: Real-time Ultrasound Guided Injection of left greater trochanteric bursa Device: GE Logiq E  Verbal informed consent obtained.  Time-out conducted.  Noted no overlying erythema, induration, or other signs of local infection.  Skin prepped in a sterile fashion.  Local anesthesia: Topical Ethyl chloride.  With sterile technique and under real time ultrasound guidance:  Using a spinal needle advanced to the greater trochanter, and then injected 1 mL kenalog 40, 2 mL lidocaine, 2 mL bupivacaine deep to the abductor insertion. Completed without difficulty  Pain immediately resolved suggesting accurate placement of the medication.  Advised to call if fevers/chills, erythema, induration, drainage, or persistent bleeding.  Images permanently stored and available for review in the ultrasound unit.  Impression: Technically successful ultrasound guided injection.  Impression and Recommendations:   This case required medical decision making of moderate complexity.  Trochanteric bursitis, left hip Injection, rehabilitation exercises. Return in one month.  Right Achilles tendinitis Starting nitroglycerin patches, patient will return for heel lift and custom orthotics.

## 2016-12-10 NOTE — Assessment & Plan Note (Signed)
Injection, rehabilitation exercises. Return in one month.

## 2016-12-10 NOTE — Assessment & Plan Note (Signed)
Starting nitroglycerin patches, patient will return for heel lift and custom orthotics.

## 2016-12-13 ENCOUNTER — Other Ambulatory Visit: Payer: Self-pay | Admitting: Family Medicine

## 2016-12-13 NOTE — Telephone Encounter (Signed)
Is pt due for a lab draw?

## 2016-12-26 ENCOUNTER — Ambulatory Visit (INDEPENDENT_AMBULATORY_CARE_PROVIDER_SITE_OTHER): Payer: Commercial Managed Care - PPO | Admitting: Sports Medicine

## 2016-12-26 ENCOUNTER — Encounter: Payer: Self-pay | Admitting: Sports Medicine

## 2016-12-26 DIAGNOSIS — M7661 Achilles tendinitis, right leg: Secondary | ICD-10-CM

## 2016-12-26 DIAGNOSIS — M7062 Trochanteric bursitis, left hip: Secondary | ICD-10-CM

## 2016-12-26 NOTE — Progress Notes (Signed)

## 2016-12-26 NOTE — Assessment & Plan Note (Signed)
Resolved after injection at the last visit 

## 2016-12-26 NOTE — Assessment & Plan Note (Signed)
Orthotics as above.

## 2017-01-23 ENCOUNTER — Other Ambulatory Visit: Payer: Self-pay | Admitting: Family Medicine

## 2017-01-27 ENCOUNTER — Ambulatory Visit (INDEPENDENT_AMBULATORY_CARE_PROVIDER_SITE_OTHER): Payer: Commercial Managed Care - PPO | Admitting: Sports Medicine

## 2017-01-27 ENCOUNTER — Encounter: Payer: Self-pay | Admitting: Sports Medicine

## 2017-01-27 DIAGNOSIS — M7661 Achilles tendinitis, right leg: Secondary | ICD-10-CM | POA: Diagnosis not present

## 2017-01-27 DIAGNOSIS — M7062 Trochanteric bursitis, left hip: Secondary | ICD-10-CM | POA: Diagnosis not present

## 2017-01-27 NOTE — Assessment & Plan Note (Signed)
Resolved after injection at the last visit 

## 2017-01-27 NOTE — Assessment & Plan Note (Signed)
Continue topical nitroglycerin patches, orthotics. I did add heel lifts. Rehabilitation exercises.  If no better in one month we will get an MRI.

## 2017-01-27 NOTE — Progress Notes (Signed)
  Subjective:    CC: Follow-up  HPI: Left trochanteric bursitis: Resolved after the injection at the last visit.  Right Achilles tendinosis: Has improved considerably with topical nitroglycerin patches and orthotics, we did not yet do heel lifts. She is also not doing her rehabilitation exercises. She is complaining of some pain radiating into the posterior heel, and some on the underside of the foot to the arch.  Past medical history:  Negative.  See flowsheet/record as well for more information.  Surgical history: Negative.  See flowsheet/record as well for more information.  Family history: Negative.  See flowsheet/record as well for more information.  Social history: Negative.  See flowsheet/record as well for more information.  Allergies, and medications have been entered into the medical record, reviewed, and no changes needed.   Review of Systems: No fevers, chills, night sweats, weight loss, chest pain, or shortness of breath.   Objective:    General: Well Developed, well nourished, and in no acute distress.  Neuro: Alert and oriented x3, extra-ocular muscles intact, sensation grossly intact.  HEENT: Normocephalic, atraumatic, pupils equal round reactive to light, neck supple, no masses, no lymphadenopathy, thyroid nonpalpable.  Skin: Warm and dry, no rashes. Cardiac: Regular rate and rhythm, no murmurs rubs or gallops, no lower extremity edema.  Respiratory: Clear to auscultation bilaterally. Not using accessory muscles, speaking in full sentences. Right Ankle: No visible erythema or swelling. Range of motion is full in all directions. Strength is 5/5 in all directions. Stable lateral and medial ligaments; squeeze test and kleiger test unremarkable; Talar dome nontender; No pain at base of 5th MT; No tenderness over cuboid; No tenderness over N spot or navicular prominence No tenderness on posterior aspects of lateral and medial malleolus No sign of peroneal tendon  subluxations; Negative tarsal tunnel tinel's Visible and palpable Achilles nodule, only minimally tender  Impression and Recommendations:    Trochanteric bursitis, left hip Resolved after injection at the last visit.  Right Achilles tendinitis Continue topical nitroglycerin patches, orthotics. I did add heel lifts. Rehabilitation exercises.  If no better in one month we will get an MRI.  I spent 25 minutes with this patient, greater than 50% was face-to-face time counseling regarding the above diagnoses

## 2017-02-24 ENCOUNTER — Ambulatory Visit: Payer: Commercial Managed Care - PPO | Admitting: Sports Medicine

## 2017-02-27 ENCOUNTER — Encounter: Payer: Self-pay | Admitting: Family Medicine

## 2017-02-28 ENCOUNTER — Ambulatory Visit (INDEPENDENT_AMBULATORY_CARE_PROVIDER_SITE_OTHER): Payer: Commercial Managed Care - PPO | Admitting: Family Medicine

## 2017-02-28 ENCOUNTER — Encounter: Payer: Self-pay | Admitting: Family Medicine

## 2017-02-28 VITALS — BP 107/77 | HR 73 | Temp 98.3°F | Ht 68.0 in | Wt 168.0 lb

## 2017-02-28 DIAGNOSIS — K589 Irritable bowel syndrome without diarrhea: Secondary | ICD-10-CM | POA: Diagnosis not present

## 2017-02-28 DIAGNOSIS — R109 Unspecified abdominal pain: Secondary | ICD-10-CM

## 2017-02-28 LAB — POC URINALSYSI DIPSTICK (AUTOMATED)
Bilirubin, UA: NEGATIVE
Blood, UA: NEGATIVE
GLUCOSE UA: NEGATIVE
Ketones, UA: NEGATIVE
Leukocytes, UA: NEGATIVE
NITRITE UA: NEGATIVE
Protein, UA: NEGATIVE
Spec Grav, UA: 1.015 (ref 1.010–1.025)
UROBILINOGEN UA: 0.2 U/dL
pH, UA: 7 (ref 5.0–8.0)

## 2017-02-28 NOTE — Patient Instructions (Signed)
WE NOW OFFER   [facility] FAST TRACK!!!  SAME DAY Appointments for ACUTE CARE  Such as: Sprains, Injuries, cuts, abrasions, rashes, muscle pain, joint pain, back pain Colds, flu, sore throats, headache, allergies, cough, fever  Ear pain, sinus and eye infections Abdominal pain, nausea, vomiting, diarrhea, upset stomach Animal/insect bites  3 Easy Ways to Schedule: Walk-In Scheduling Call in scheduling Mychart Sign-up: [email or web address]         

## 2017-02-28 NOTE — Addendum Note (Signed)
Addended by: Aggie Hacker A on: 02/28/2017 02:06 PM   Modules accepted: Orders

## 2017-02-28 NOTE — Progress Notes (Signed)
   Subjective:    Patient ID: Laura [name], female    DOB: 06-04-1976, 41 y.o.   MRN: [id number]  HPI Here for 2 days of lower abdominal pains and some mild lower back pains. She has a long hx of IBS and her BMs alternate between constipation and diarrhea. For the past 2 days she has had some loose stools. No nausea or fever. No urinary frequency or burning.    Review of Systems  Constitutional: Negative.   Respiratory: Negative.   Cardiovascular: Negative.   Gastrointestinal: Positive for abdominal pain and diarrhea. Negative for abdominal distention, anal bleeding, blood in stool, constipation, nausea, rectal pain and vomiting.  Genitourinary: Negative.        Objective:   Physical Exam  Constitutional: She appears well-developed and well-nourished.  Cardiovascular: Normal rate, regular rhythm, normal heart sounds and intact distal pulses.   Pulmonary/Chest: Effort normal and breath sounds normal. No respiratory distress. She has no wheezes. She has no rales.  Abdominal: Soft. Bowel sounds are normal. She exhibits no distension and no mass. There is no rebound and no guarding.  Mild bilateral lower abdominal tenderness           Assessment & Plan:  Probable flare of IBS. She can use Imodium prn.  [name], MD

## 2017-04-25 ENCOUNTER — Encounter: Payer: Self-pay | Admitting: *Deleted

## 2017-04-25 ENCOUNTER — Ambulatory Visit: Payer: Self-pay | Admitting: *Deleted

## 2017-04-25 ENCOUNTER — Ambulatory Visit: Payer: Self-pay | Admitting: Family Medicine

## 2017-04-25 ENCOUNTER — Telehealth: Payer: Self-pay | Admitting: Family Medicine

## 2017-04-25 NOTE — Telephone Encounter (Signed)
TRIAGE  ENCOUNTER    DONE  ON  PATIENT

## 2017-04-25 NOTE — Telephone Encounter (Signed)
Copied from [place] [id number]. Topic: Inquiry >> Apr 25, 2017 10:03 AM Cecelia Byars, NT wrote: Reason for CRM: Patient wants to speak with nurse in reference to picking up a pin worm or a thread worm,and wants to know what to do , wants to know will this affect her husband

## 2017-04-25 NOTE — Telephone Encounter (Signed)
   Reason for Disposition . [1] Pregnant with rectal symptoms AND [2] pinworms seen or suspected  Answer Assessment - Initial Assessment Questions 1. PRESENCE OF PINWORMS: Have you seen any pinworms? If yes, "What do the worms look like? How long are the worms?" (Pinworms are very thin thread-like worms about the length of a staple.    Yes   Thin   White   Like  A  Piece  Of  Thread     2. SYMPTOMS: "Do you have any other symptoms?" (e.g., anal itching, anal redness)     Anal  Itching  Unknown  Redness  Because  You  Cannot  See   3. ONSET: "When did you first notice symptoms or pinworms?"       approx  6  Days   4. EXPOSURE: "Have you been exposed to someone who has pinworms?" (e.g., family member, intimate partner).      No  5. PREGNANCY: "Is there any chance you are pregnant?" "When was your last menstrual period?"     Hysterectomy  Protocols used: [facility] [place]

## 2017-04-25 NOTE — Telephone Encounter (Signed)
Pt   Not  Pregnant

## 2017-04-25 NOTE — Telephone Encounter (Signed)
Copied from [place] [id number]. Topic: Inquiry >> Apr 25, 2017 10:03 AM Cecelia Byars, NT wrote: Reason for CRM: Patient wants to speak with nurse in reference to picking up a pin worm or a thread worm,and wants to know what to do , wants to know will this affect her husband

## 2017-04-25 NOTE — Telephone Encounter (Signed)
Left voicemail for her to call us back.  Was calling regarding pinworm concerns.

## 2017-05-04 ENCOUNTER — Other Ambulatory Visit: Payer: Self-pay | Admitting: Family Medicine

## 2017-05-05 NOTE — Telephone Encounter (Signed)
Sent to PCP for approval.  

## 2017-05-28 NOTE — Progress Notes (Signed)
Letter  Created  In  error

## 2017-06-24 ENCOUNTER — Other Ambulatory Visit: Payer: Self-pay | Admitting: Family Medicine

## 2017-07-18 ENCOUNTER — Encounter: Payer: Self-pay | Admitting: Sports Medicine

## 2017-07-18 ENCOUNTER — Ambulatory Visit: Payer: Commercial Managed Care - PPO | Admitting: Sports Medicine

## 2017-07-18 DIAGNOSIS — L304 Erythema intertrigo: Secondary | ICD-10-CM | POA: Diagnosis not present

## 2017-07-18 DIAGNOSIS — M24159 Other articular cartilage disorders, unspecified hip: Secondary | ICD-10-CM

## 2017-07-18 DIAGNOSIS — M169 Osteoarthritis of hip, unspecified: Secondary | ICD-10-CM

## 2017-07-18 MED ORDER — CLOTRIMAZOLE-BETAMETHASONE 1-0.05 % EX CREA: 1.0000 "application " | TOPICAL_CREAM | Freq: Two times a day (BID) | CUTANEOUS | 0 refills | Status: DC

## 2017-07-18 NOTE — Assessment & Plan Note (Signed)
Right inframammary intertrigo is what I suspect. Trying topical Lotrisone for now.

## 2017-07-18 NOTE — Assessment & Plan Note (Signed)
We have done several trochanteric bursa injections all of which have relieved her lateral hip pain. She has known labral degenerative changes, hip surgery was not eager to do an arthroscopy, and we do know the limitations of labral debridement for hip labral tears. She is having a recurrence of pain in the groin, and she uses the classic C sign to demonstrate her pain. I have not done a hip joint injection for a long time, this was done today, return to see me in 1 month.

## 2017-07-18 NOTE — Progress Notes (Signed)
Subjective:    CC: Left hip pain  HPI: Laura [name] is a pleasant 42 year old female, we have been dealing with multifactorial left hip pain for some time now, and MR arthrogram did show some labral fraying.  She was seen by the hip surgeon, arthroscopy was not planned.  She understands that we are very limited in the success of hip arthroscopy for labral tears.  We have done a couple of trochanteric bursa injections that are provided good relief of her lateral pain.  Unfortunately she is having pain anteriorly and posteriorly with radiation to the anterior thigh, she does demonstrate the pain using the classic C sign with her thumb and index finger.  No overt mechanical symptoms at this point.  In the meantime she has had a cholecystectomy which relieved a lot of her abdominal pain.  I reviewed the past medical history, family history, social history, surgical history, and allergies today and no changes were needed.  Please see the problem list section below in epic for further details.  Past Medical History: Past Medical History:  Diagnosis Date  . Anemia   . Anxiety   . Depression   . GERD (gastroesophageal reflux disease)    sees Dr. Ludwig Lean at [place]   . Headache(784.0)   . Hypothyroidism    sees Dr. Malachy Mood in [place]   . IBS (irritable bowel syndrome)    sees Dr. Eusebio Friendly in [place]   . Insomnia   . Pituitary microadenoma with hyperprolactinemia [facility])    sees Dr. Mare Ferrari in [place]  . Restless legs   . Thyroid disease   . UTI (lower urinary tract infection)    sees Dr Karsten Ro   Past Surgical History: Past Surgical History:  Procedure Laterality Date  . ABDOMINAL HYSTERECTOMY  Nov 2008  . COLONOSCOPY  08-01-14   per Dr. Cindee Salt, tubular adenoma, repeat in 5 yrs   . ESOPHAGOGASTRODUODENOSCOPY  08-01-14   per Dr. Cindee Salt, gastritis only   . [place]   L side lymph node removal  . TONSILLECTOMY    . TUBAL LIGATION    . WISDOM TOOTH  EXTRACTION     Social History: Social History   Socioeconomic History  . Marital status: Single    Spouse name: None  . Number of children: 2  . Years of education: None  . Highest education level: None  Social Needs  . Financial resource strain: None  . Food insecurity - worry: None  . Food insecurity - inability: None  . Transportation needs - medical: None  . Transportation needs - non-medical: None  Occupational History  . Occupation: Product/process development scientist: [employer]  Tobacco Use  . Smoking status: Never Smoker  . Smokeless tobacco: Never Used  Substance and Sexual Activity  . Alcohol use: No    Alcohol/week: 0.0 oz  . Drug use: No  . Sexual activity: None  Other Topics Concern  . None  Social History Narrative   Lives with husband and 2 children in a 2 story home.    Works as an Optometrist.  Education: MBA   Family History: Family History  Problem Relation Age of Onset  . Alcohol abuse Father   . Hypertension Father   . Hypertension Mother   . Thyroid disease Mother        maternal grandmother  . Arrhythmia Mother   . Coronary artery disease Maternal Grandmother        paternal grandmother  .  Hypertension Maternal Grandmother   . Diabetes Maternal Grandfather   . Hypertension Maternal Grandfather   . Aneurysm Paternal Grandfather        aortic  . Brain cancer Brother   . Colon cancer Neg Hx   . Esophageal cancer Neg Hx   . Stomach cancer Neg Hx    Allergies: Allergies  Allergen Reactions  . Magnesium Sulfate Shortness Of Breath  . Dilaudid [Hydromorphone Hcl]   . Hydromorphone Hcl   . Iohexol Other (See Comments)    Upper respiratory (sneezing) (IV contrast )  . Ivp Dye [Iodinated Diagnostic Agents]   . Penicillins   . Levofloxacin Rash   Medications: See med rec.  Review of Systems: No fevers, chills, night sweats, weight loss, chest pain, or shortness of breath.   Objective:    General: Well Developed, well nourished, and in no acute  distress.  Neuro: Alert and oriented x3, extra-ocular muscles intact, sensation grossly intact.  HEENT: Normocephalic, atraumatic, pupils equal round reactive to light, neck supple, no masses, no lymphadenopathy, thyroid nonpalpable.  Skin: Warm and dry, no rashes. Cardiac: Regular rate and rhythm, no murmurs rubs or gallops, no lower extremity edema.  Respiratory: Clear to auscultation bilaterally. Not using accessory muscles, speaking in full sentences. Left hip: ROM IR: 60 Deg, ER: 60 Deg, Flexion: 120 Deg, Extension: 100 Deg, Abduction: 45 Deg, Adduction: 45 Deg Strength IR: 5/5, ER: 5/5, Flexion: 5/5, Extension: 5/5, Abduction: 5/5, Adduction: 5/5 Pelvic alignment unremarkable to inspection and palpation. Standing hip rotation and gait without trendelenburg / unsteadiness. Greater trochanter without tenderness to palpation. No tenderness over piriformis. No SI joint tenderness and normal minimal SI movement.  Procedure: Real-time Ultrasound Guided Injection of left hip joint Device: GE Logiq E  Verbal informed consent obtained.  Time-out conducted.  Noted no overlying erythema, induration, or other signs of local infection.  Skin prepped in a sterile fashion.  Local anesthesia: Topical Ethyl chloride.  With sterile technique and under real time ultrasound guidance: 22-gauge spinal needle advanced to the femoral head/neck junction, contacted bone to ensure penetration of the capsule and injected 1 cc kenalog 40, 2 cc lidocaine, 2 cc bupivacaine. Completed without difficulty  Pain immediately resolved suggesting accurate placement of the medication.  Advised to call if fevers/chills, erythema, induration, drainage, or persistent bleeding.  Images permanently stored and available for review in the ultrasound unit.  Impression: Technically successful ultrasound guided injection.  Impression and Recommendations:    Labral tear of hip, degenerative We have done several trochanteric  bursa injections all of which have relieved her lateral hip pain. She has known labral degenerative changes, hip surgery was not eager to do an arthroscopy, and we do know the limitations of labral debridement for hip labral tears. She is having a recurrence of pain in the groin, and she uses the classic C sign to demonstrate her pain. I have not done a hip joint injection for a long time, this was done today, return to see me in 1 month. ___________________________________________ Gwen Her. Dianah Field, M.D., ABFM., CAQSM. Primary Care and [place] Instructor of [place] of [facility] of Medicine

## 2017-08-15 ENCOUNTER — Ambulatory Visit (INDEPENDENT_AMBULATORY_CARE_PROVIDER_SITE_OTHER): Payer: Commercial Managed Care - PPO | Admitting: Sports Medicine

## 2017-08-15 ENCOUNTER — Encounter: Payer: Self-pay | Admitting: Sports Medicine

## 2017-08-15 DIAGNOSIS — M24159 Other articular cartilage disorders, unspecified hip: Secondary | ICD-10-CM

## 2017-08-15 DIAGNOSIS — M169 Osteoarthritis of hip, unspecified: Secondary | ICD-10-CM | POA: Diagnosis not present

## 2017-08-15 DIAGNOSIS — M7062 Trochanteric bursitis, left hip: Secondary | ICD-10-CM | POA: Diagnosis not present

## 2017-08-15 NOTE — Assessment & Plan Note (Signed)
Resolved after hip joint injection at the last visit.

## 2017-08-15 NOTE — Assessment & Plan Note (Signed)
Now with a flare in trochanteric bursa related pain. Injection as above, previous injection was 8 months ago.

## 2017-08-15 NOTE — Progress Notes (Signed)
Subjective:    CC: Left hip pain  HPI: This is a pleasant 42 year old female, she has a known hip labral tear from an MRI couple of years ago, we injected her left hip joint at the last visit and she is doing very well from an anterior/groin pain perspective, unfortunately she started to have recurrence of pain over the lateral hip, moderate, persistent, radiation to the lateral thigh, worse laying on the ipsilateral side.  Desires repeat intervention today, she is doing her rehab exercises.  I reviewed the past medical history, family history, social history, surgical history, and allergies today and no changes were needed.  Please see the problem list section below in epic for further details.  Past Medical History: Past Medical History:  Diagnosis Date  . Anemia   . Anxiety   . Depression   . GERD (gastroesophageal reflux disease)    sees Dr. Ludwig Lean at [place]   . Headache(784.0)   . Hypothyroidism    sees Dr. Malachy Mood in [place]   . IBS (irritable bowel syndrome)    sees Dr. Eusebio Friendly in [place]   . Insomnia   . Pituitary microadenoma with hyperprolactinemia [facility])    sees Dr. Mare Ferrari in [place]  . Restless legs   . Thyroid disease   . UTI (lower urinary tract infection)    sees Dr Karsten Ro   Past Surgical History: Past Surgical History:  Procedure Laterality Date  . ABDOMINAL HYSTERECTOMY  Nov 2008  . COLONOSCOPY  08-01-14   per Dr. Cindee Salt, tubular adenoma, repeat in 5 yrs   . ESOPHAGOGASTRODUODENOSCOPY  08-01-14   per Dr. Cindee Salt, gastritis only   . [place]   L side lymph node removal  . TONSILLECTOMY    . TUBAL LIGATION    . WISDOM TOOTH EXTRACTION     Social History: Social History   Socioeconomic History  . Marital status: Single    Spouse name: None  . Number of children: 2  . Years of education: None  . Highest education level: None  Social Needs  . Financial resource strain: None  . Food insecurity - worry:  None  . Food insecurity - inability: None  . Transportation needs - medical: None  . Transportation needs - non-medical: None  Occupational History  . Occupation: Product/process development scientist: [employer]  Tobacco Use  . Smoking status: Never Smoker  . Smokeless tobacco: Never Used  Substance and Sexual Activity  . Alcohol use: No    Alcohol/week: 0.0 oz  . Drug use: No  . Sexual activity: None  Other Topics Concern  . None  Social History Narrative   Lives with husband and 2 children in a 2 story home.    Works as an Optometrist.  Education: MBA   Family History: Family History  Problem Relation Age of Onset  . Alcohol abuse Father   . Hypertension Father   . Hypertension Mother   . Thyroid disease Mother        maternal grandmother  . Arrhythmia Mother   . Coronary artery disease Maternal Grandmother        paternal grandmother  . Hypertension Maternal Grandmother   . Diabetes Maternal Grandfather   . Hypertension Maternal Grandfather   . Aneurysm Paternal Grandfather        aortic  . Brain cancer Brother   . Colon cancer Neg Hx   . Esophageal cancer Neg Hx   . Stomach cancer Neg  Hx    Allergies: Allergies  Allergen Reactions  . Magnesium Sulfate Shortness Of Breath  . Dilaudid [Hydromorphone Hcl]   . Hydromorphone Hcl   . Iohexol Other (See Comments)    Upper respiratory (sneezing) (IV contrast )  . Ivp Dye [Iodinated Diagnostic Agents]   . Penicillins   . Levofloxacin Rash   Medications: See med rec.  Review of Systems: No fevers, chills, night sweats, weight loss, chest pain, or shortness of breath.   Objective:    General: Well Developed, well nourished, and in no acute distress.  Neuro: Alert and oriented x3, extra-ocular muscles intact, sensation grossly intact.  HEENT: Normocephalic, atraumatic, pupils equal round reactive to light, neck supple, no masses, no lymphadenopathy, thyroid nonpalpable.  Skin: Warm and dry, no rashes. Cardiac: Regular rate  and rhythm, no murmurs rubs or gallops, no lower extremity edema.  Respiratory: Clear to auscultation bilaterally. Not using accessory muscles, speaking in full sentences. Left hip: ROM IR: 60 Deg, ER: 60 Deg, Flexion: 120 Deg, Extension: 100 Deg, Abduction: 45 Deg, Adduction: 45 Deg Strength IR: 5/5, ER: 5/5, Flexion: 5/5, Extension: 5/5, Abduction: 5/5, Adduction: 5/5 Pelvic alignment unremarkable to inspection and palpation. Standing hip rotation and gait without trendelenburg / unsteadiness. Greater trochanter with tenderness to palpation. No tenderness over piriformis. No SI joint tenderness and normal minimal SI movement.  Procedure: Real-time Ultrasound Guided Injection of trochanteric bursa Device: GE Logiq E  Verbal informed consent obtained.  Time-out conducted.  Noted no overlying erythema, induration, or other signs of local infection.  Skin prepped in a sterile fashion.  Local anesthesia: Topical Ethyl chloride.  With sterile technique and under real time ultrasound guidance: Using a 22-gauge 7 inch spinal needle I injected 1 cc Kenalog 40, 2 cc lidocaine, 2 cc bupivacaine easily. Completed without difficulty  Pain immediately resolved suggesting accurate placement of the medication.  Advised to call if fevers/chills, erythema, induration, drainage, or persistent bleeding.  Images permanently stored and available for review in the ultrasound unit.  Impression: Technically successful ultrasound guided injection.  Impression and Recommendations:    Labral tear of hip, degenerative Resolved after hip joint injection at the last visit.  Trochanteric bursitis, left hip Now with a flare in trochanteric bursa related pain. Injection as above, previous injection was 8 months ago. ___________________________________________ Gwen Her. Dianah Field, M.D., ABFM., CAQSM. Primary Care and [place] Instructor of [place] of [facility] of Medicine

## 2017-08-27 ENCOUNTER — Encounter: Payer: Self-pay | Admitting: Family Medicine

## 2017-08-27 ENCOUNTER — Ambulatory Visit: Payer: Commercial Managed Care - PPO | Admitting: Family Medicine

## 2017-08-27 VITALS — BP 92/60 | HR 69 | Temp 98.0°F | Wt 169.2 lb

## 2017-08-27 DIAGNOSIS — M6281 Muscle weakness (generalized): Secondary | ICD-10-CM | POA: Diagnosis not present

## 2017-08-27 DIAGNOSIS — R252 Cramp and spasm: Secondary | ICD-10-CM | POA: Diagnosis not present

## 2017-08-27 DIAGNOSIS — E039 Hypothyroidism, unspecified: Secondary | ICD-10-CM | POA: Diagnosis not present

## 2017-08-27 DIAGNOSIS — M797 Fibromyalgia: Secondary | ICD-10-CM | POA: Diagnosis not present

## 2017-08-27 LAB — CBC WITH DIFFERENTIAL/PLATELET
BASOS ABS: 0.1 10*3/uL (ref 0.0–0.1)
Basophils Relative: 1.1 % (ref 0.0–3.0)
EOS PCT: 1.1 % (ref 0.0–5.0)
Eosinophils Absolute: 0.1 10*3/uL (ref 0.0–0.7)
HCT: 40 % (ref 36.0–46.0)
Hemoglobin: 13.3 g/dL (ref 12.0–15.0)
LYMPHS ABS: 1.5 10*3/uL (ref 0.7–4.0)
Lymphocytes Relative: 31.4 % (ref 12.0–46.0)
MCHC: 33.2 g/dL (ref 30.0–36.0)
MCV: 93.7 fl (ref 78.0–100.0)
MONO ABS: 0.5 10*3/uL (ref 0.1–1.0)
Monocytes Relative: 11.3 % (ref 3.0–12.0)
Neutro Abs: 2.6 10*3/uL (ref 1.4–7.7)
Neutrophils Relative %: 55.1 % (ref 43.0–77.0)
Platelets: 237 10*3/uL (ref 150.0–400.0)
RBC: 4.27 Mil/uL (ref 3.87–5.11)
RDW: 13.8 % (ref 11.5–15.5)
WBC: 4.7 10*3/uL (ref 4.0–10.5)

## 2017-08-27 LAB — VITAMIN D 25 HYDROXY (VIT D DEFICIENCY, FRACTURES): VITD: 50.94 ng/mL (ref 30.00–100.00)

## 2017-08-27 LAB — VITAMIN B12: VITAMIN B 12: 256 pg/mL (ref 211–911)

## 2017-08-27 LAB — TSH: TSH: 1.61 u[IU]/mL (ref 0.35–4.50)

## 2017-08-27 NOTE — Progress Notes (Signed)
   Subjective:    Patient ID: Laura [name], female    DOB: 05-27-1976, 42 y.o.   MRN: [id number]  HPI Here for several issues. First for the past 3 months she has experienced generalized fatigue and she finds herself falling asleep whenever she sits down, even at work. This does not happen in her car. She sleeps well at night except when she has muscle cramps. This is another problem to discuss. For the past 4 weeks she has had a lot of cramps in the feet and lower legs, in the daytime and the nighttime. These are quite painful and she has to massage and stretch her muscles to female them stop.    Review of Systems  Constitutional: Positive for fatigue. Negative for activity change, appetite change and unexpected weight change.  Respiratory: Negative.   Cardiovascular: Negative.   Gastrointestinal: Negative.   Genitourinary: Negative.   Musculoskeletal: Positive for myalgias.  Neurological: Negative.        Objective:   Physical Exam  Constitutional: She is oriented to person, place, and time. She appears well-developed and well-nourished. No distress.  Neck: No thyromegaly present.  Cardiovascular: Normal rate, regular rhythm, normal heart sounds and intact distal pulses.  Pulmonary/Chest: Effort normal and breath sounds normal. No respiratory distress. She has no wheezes. She has no rales.  Musculoskeletal: Normal range of motion. She exhibits no edema or tenderness.  Lymphadenopathy:    She has no cervical adenopathy.  Neurological: She is alert and oriented to person, place, and time.          Assessment & Plan:  For the fatigue we will check some labs today, as she has had anemia, low B12, low vit D, etc in the past. For the muscle cramps she will take 2-3 magnesium tablets each night at bedtime.  [name], MD

## 2017-08-28 LAB — HEPATIC FUNCTION PANEL
ALK PHOS: 59 U/L (ref 39–117)
ALT: 12 U/L (ref 0–35)
AST: 11 U/L (ref 0–37)
Albumin: 4.8 g/dL (ref 3.5–5.2)
BILIRUBIN DIRECT: 0.1 mg/dL (ref 0.0–0.3)
TOTAL PROTEIN: 7 g/dL (ref 6.0–8.3)
Total Bilirubin: 0.2 mg/dL (ref 0.2–1.2)

## 2017-08-28 LAB — MAGNESIUM: MAGNESIUM: 2.1 mg/dL (ref 1.5–2.5)

## 2017-08-28 LAB — BASIC METABOLIC PANEL
BUN: 13 mg/dL (ref 6–23)
CALCIUM: 9.7 mg/dL (ref 8.4–10.5)
CO2: 28 meq/L (ref 19–32)
Chloride: 103 mEq/L (ref 96–112)
Creatinine, Ser: 0.95 mg/dL (ref 0.40–1.20)
GFR: 68.47 mL/min (ref >60.00)
Glucose, Bld: 65 mg/dL — ABNORMAL LOW (ref 70–99)
Potassium: 4.2 mEq/L (ref 3.5–5.1)
Sodium: 140 mEq/L (ref 135–145)

## 2017-09-01 ENCOUNTER — Encounter: Payer: Self-pay | Admitting: Family Medicine

## 2017-09-01 NOTE — Telephone Encounter (Signed)
This is dr Sarajane Jews patient and I  dont think I have seen her    Sending it to dr Sarajane Jews

## 2017-09-01 NOTE — Telephone Encounter (Signed)
Please advise Dr Panosh, thanks.   

## 2017-09-02 NOTE — Telephone Encounter (Signed)
It may be that all these all contributing to her feeling tired. For the glucose, eat frequent snacks between meals. For the BP, exercise should help. For the B12, take 1000 mcg daily of OTC B12 dissolving tabs

## 2017-10-12 ENCOUNTER — Other Ambulatory Visit: Payer: Self-pay | Admitting: Family Medicine

## 2017-10-24 ENCOUNTER — Other Ambulatory Visit: Payer: Self-pay | Admitting: *Deleted

## 2017-10-24 DIAGNOSIS — E559 Vitamin D deficiency, unspecified: Secondary | ICD-10-CM

## 2017-10-24 NOTE — Telephone Encounter (Signed)
Fax refill request received from [facility] for vitamin D 50000 units weekly.  Last filled 04/18/16 x1 year.  Last vitamin D labs in 2019 normal.  Please advise, is this patient on long term high dose vitamin D therapy? Okay to refill?

## 2017-10-27 ENCOUNTER — Encounter: Payer: Self-pay | Admitting: *Deleted

## 2017-10-27 NOTE — Telephone Encounter (Signed)
Have er switch to OTC vitamin D to take 5000 units daily. Recheck a level in 90 days

## 2017-10-27 NOTE — Telephone Encounter (Signed)
MyChart message sent to patient.  Future lab orders entered.

## 2017-12-19 ENCOUNTER — Ambulatory Visit: Payer: Commercial Managed Care - PPO | Admitting: Sports Medicine

## 2017-12-19 DIAGNOSIS — M25511 Pain in right shoulder: Secondary | ICD-10-CM | POA: Diagnosis not present

## 2017-12-19 DIAGNOSIS — M7062 Trochanteric bursitis, left hip: Secondary | ICD-10-CM

## 2017-12-19 DIAGNOSIS — G8929 Other chronic pain: Secondary | ICD-10-CM

## 2017-12-19 NOTE — Assessment & Plan Note (Signed)
Trochanteric bursa versus gluteus medius tendon sheath injection as above, return in 1 month.

## 2017-12-19 NOTE — Assessment & Plan Note (Signed)
Previous injection was 5 years ago, right subacromial injection as above.

## 2017-12-19 NOTE — Progress Notes (Signed)
Subjective:    I'm seeing this patient as a consultation for: Dr. [name]  CC: Right shoulder pain, left hip pain  HPI: This is a pleasant 42 year old female, I see her for right shoulder bursitis about 5 years ago, she had an injection and did extremely well, now having a recurrence of pain, localized over the deltoid, moderate, persistent, with overhead activities.  Oral NSAIDs, stretches and rehab exercises have not been effective.  In addition she has known chronic left trochanteric bursitis, previous injection was about 4 months ago, pain is recurrent, desires repeat injection today.  Localized without radiation.  I reviewed the past medical history, family history, social history, surgical history, and allergies today and no changes were needed.  Please see the problem list section below in epic for further details.  Past Medical History: Past Medical History:  Diagnosis Date  . Anemia   . Anxiety   . Depression   . GERD (gastroesophageal reflux disease)    sees Dr. Ludwig Lean at [place]   . Headache(784.0)   . Hypothyroidism    sees Dr. Malachy Mood in [place]   . IBS (irritable bowel syndrome)    sees Dr. Eusebio Friendly in [place]   . Insomnia   . Pituitary microadenoma with hyperprolactinemia [facility])    sees Dr. Mare Ferrari in [place]  . Restless legs   . Thyroid disease   . UTI (lower urinary tract infection)    sees Dr Karsten Ro   Past Surgical History: Past Surgical History:  Procedure Laterality Date  . ABDOMINAL HYSTERECTOMY  Nov 2008  . COLONOSCOPY  08-01-14   per Dr. Cindee Salt, tubular adenoma, repeat in 5 yrs   . ESOPHAGOGASTRODUODENOSCOPY  08-01-14   per Dr. Cindee Salt, gastritis only   . [place]   L side lymph node removal  . TONSILLECTOMY    . TUBAL LIGATION    . WISDOM TOOTH EXTRACTION     Social History: Social History   Socioeconomic History  . Marital status: Single    Spouse name: Not on file  . Number of children: 2    . Years of education: Not on file  . Highest education level: Not on file  Occupational History  . Occupation: Product/process development scientist: [employer]  . Financial resource strain: Not on file  . Food insecurity:    Worry: Not on file    Inability: Not on file  . Transportation needs:    Medical: Not on file    Non-medical: Not on file  Tobacco Use  . Smoking status: Never Smoker  . Smokeless tobacco: Never Used  Substance and Sexual Activity  . Alcohol use: No    Alcohol/week: 0.0 oz  . Drug use: No  . Sexual activity: Not on file  Lifestyle  . Physical activity:    Days per week: Not on file    Minutes per session: Not on file  . Stress: Not on file  Relationships  . Social connections:    Talks on phone: Not on file    Gets together: Not on file    Attends religious service: Not on file    Active member of club or organization: Not on file    Attends meetings of clubs or organizations: Not on file    Relationship status: Not on file  Other Topics Concern  . Not on file  Social History Narrative   Lives with husband and 2 children in  a 2 story home.    Works as an Optometrist.  Education: MBA   Family History: Family History  Problem Relation Age of Onset  . Alcohol abuse Father   . Hypertension Father   . Hypertension Mother   . Thyroid disease Mother        maternal grandmother  . Arrhythmia Mother   . Coronary artery disease Maternal Grandmother        paternal grandmother  . Hypertension Maternal Grandmother   . Diabetes Maternal Grandfather   . Hypertension Maternal Grandfather   . Aneurysm Paternal Grandfather        aortic  . Brain cancer Brother   . Colon cancer Neg Hx   . Esophageal cancer Neg Hx   . Stomach cancer Neg Hx    Allergies: Allergies  Allergen Reactions  . Magnesium Sulfate Shortness Of Breath  . Dilaudid [Hydromorphone Hcl]   . Hydromorphone Hcl   . Iohexol Other (See Comments)    Upper respiratory (sneezing) (IV  contrast )  . Ivp Dye [Iodinated Diagnostic Agents]   . Penicillins   . Levofloxacin Rash   Medications: See med rec.  Review of Systems: No headache, visual changes, nausea, vomiting, diarrhea, constipation, dizziness, abdominal pain, skin rash, fevers, chills, night sweats, weight loss, swollen lymph nodes, body aches, joint swelling, muscle aches, chest pain, shortness of breath, mood changes, visual or auditory hallucinations.   Objective:   General: Well Developed, well nourished, and in no acute distress.  Neuro:  Extra-ocular muscles intact, able to move all 4 extremities, sensation grossly intact.  Deep tendon reflexes tested were normal. Psych: Alert and oriented, mood congruent with affect. ENT:  Ears and nose appear unremarkable.  Hearing grossly normal. Neck: Unremarkable overall appearance, trachea midline.  No visible thyroid enlargement. Eyes: Conjunctivae and lids appear unremarkable.  Pupils equal and round. Skin: Warm and dry, no rashes noted.  Cardiovascular: Pulses palpable, no extremity edema. Right shoulder: Inspection reveals no abnormalities, atrophy or asymmetry. Palpation is normal with no tenderness over AC joint or bicipital groove. ROM is full in all planes. Rotator cuff strength normal throughout. Positive Neer and Hawkin's tests, empty can. Speeds and Yergason's tests normal. No labral pathology noted with negative Obrien's, negative crank, negative clunk, and good stability. Normal scapular function observed. No painful arc and no drop arm sign. No apprehension sign Left hip: ROM IR: 60 Deg, ER: 60 Deg, Flexion: 120 Deg, Extension: 100 Deg, Abduction: 45 Deg, Adduction: 45 Deg Strength IR: 5/5, ER: 5/5, Flexion: 5/5, Extension: 5/5, Abduction: 5/5, Adduction: 5/5 Pelvic alignment unremarkable to inspection and palpation. Standing hip rotation and gait without trendelenburg / unsteadiness. Greater trochanter with tenderness to palpation. No  tenderness over piriformis. No SI joint tenderness and normal minimal SI movement.  Procedure: Real-time Ultrasound Guided Injection of right subacromial bursa Device: GE Logiq E  Verbal informed consent obtained.  Time-out conducted.  Noted no overlying erythema, induration, or other signs of local infection.  Skin prepped in a sterile fashion.  Local anesthesia: Topical Ethyl chloride.  With sterile technique and under real time ultrasound guidance: 1 cc Kenalog 40, 1 cc lidocaine, 1 cc bupivacaine injected easily Completed without difficulty  Pain immediately resolved suggesting accurate placement of the medication.  Advised to call if fevers/chills, erythema, induration, drainage, or persistent bleeding.  Images permanently stored and available for review in the ultrasound unit.  Impression: Technically successful ultrasound guided injection.  Procedure: Real-time Ultrasound Guided Injection of left greater trochanteric bursa  Device: GE Logiq E  Verbal informed consent obtained.  Time-out conducted.  Noted no overlying erythema, induration, or other signs of local infection.  Skin prepped in a sterile fashion.  Local anesthesia: Topical Ethyl chloride.  With sterile technique and under real time ultrasound guidance: Using a 22-gauge spinal needle I injected medication both superficial to and deep to the gluteus medius, deep injection was placed to the greater trochanter for a total of 1 cc: 40, 2 cc lidocaine 2 cc bupivacaine. Completed without difficulty  Pain immediately resolved suggesting accurate placement of the medication.  Advised to call if fevers/chills, erythema, induration, drainage, or persistent bleeding.  Images permanently stored and available for review in the ultrasound unit.  Impression: Technically successful ultrasound guided injection.  Impression and Recommendations:   This case required medical decision making of moderate complexity.  Right shoulder  pain Previous injection was 5 years ago, right subacromial injection as above.  Trochanteric bursitis, left hip Trochanteric bursa versus gluteus medius tendon sheath injection as above, return in 1 month. ___________________________________________ Gwen Her. Dianah Field, M.D., ABFM., CAQSM. Primary Care and [place] Instructor of [employer] [facility]

## 2018-01-02 ENCOUNTER — Other Ambulatory Visit: Payer: Self-pay | Admitting: Family Medicine

## 2018-01-19 ENCOUNTER — Ambulatory Visit: Payer: Commercial Managed Care - PPO | Admitting: Sports Medicine

## 2018-03-05 ENCOUNTER — Ambulatory Visit: Payer: Commercial Managed Care - PPO | Admitting: Family Medicine

## 2018-03-05 ENCOUNTER — Encounter: Payer: Self-pay | Admitting: Family Medicine

## 2018-03-05 VITALS — BP 110/72 | HR 84 | Temp 98.3°F | Wt 182.1 lb

## 2018-03-05 DIAGNOSIS — F418 Other specified anxiety disorders: Secondary | ICD-10-CM | POA: Diagnosis not present

## 2018-03-05 MED ORDER — BUPROPION HCL ER (XL) 300 MG PO TB24: 300.0000 mg | ORAL_TABLET | Freq: Every morning | ORAL | 3 refills | Status: DC

## 2018-03-05 MED ORDER — FLUOXETINE HCL 20 MG PO TABS: 20.0000 mg | ORAL_TABLET | Freq: Every day | ORAL | 3 refills | Status: DC

## 2018-03-05 NOTE — Progress Notes (Signed)
   Subjective:    Patient ID: Laura [name], female    DOB: 1975/06/28, 42 y.o.   MRN: [id number]  HPI Here asking for help with depression and anxiety. She has been taking Wellbutrin for several years, and this along with regular psychotherapy have been very helpful to her. She sees Development worker, community for this. However she stopped going to therpay in February because of time constraints including a new job, family health issues, etc. Now over the past few months some of her old symptoms have returned like fatigue, sadness and hopelessness, crying spells, poor sleep, etc. She is also seeing Dr. Ephriam Jenkins of [facility] for Raynauds syndrome. He suggested she take an SSRI because this could help with the Raynauds.    Review of Systems  Constitutional: Positive for fatigue.  Respiratory: Negative.   Cardiovascular: Negative.   Neurological: Negative.   Psychiatric/Behavioral: Positive for decreased concentration, dysphoric mood and sleep disturbance. Negative for agitation, behavioral problems, confusion, hallucinations, self-injury and suicidal ideas. The patient is nervous/anxious.        Objective:   Physical Exam  Constitutional: She is oriented to person, place, and time. She appears well-developed and well-nourished.  Cardiovascular: Normal rate, regular rhythm, normal heart sounds and intact distal pulses.  Pulmonary/Chest: Effort normal and breath sounds normal.  Neurological: She is alert and oriented to person, place, and time.  Psychiatric: Her behavior is normal. Judgment and thought content normal.  Depressed, tearful           Assessment & Plan:  Depression with anxiety, we will add Prozac 20 mg daily to her regimen. She will see her therapist asap to reinitiate therapy. Recheck here in 3-4 weeks. We spent 45 minutes together discussing these issues.  [name], MD

## 2018-03-28 ENCOUNTER — Emergency Department
Admission: EM | Admit: 2018-03-28 | Discharge: 2018-03-28 | Disposition: A | Discharge status: Home / Self Care | Payer: Commercial Managed Care - PPO | Source: Home / Self Care | Attending: Family Medicine | Admitting: Family Medicine

## 2018-03-28 ENCOUNTER — Other Ambulatory Visit: Payer: Self-pay

## 2018-03-28 ENCOUNTER — Encounter: Payer: Self-pay | Admitting: Emergency Medicine

## 2018-03-28 DIAGNOSIS — J019 Acute sinusitis, unspecified: Secondary | ICD-10-CM

## 2018-03-28 DIAGNOSIS — B9689 Other specified bacterial agents as the cause of diseases classified elsewhere: Secondary | ICD-10-CM

## 2018-03-28 MED ORDER — AZITHROMYCIN 250 MG PO TABS: 250.0000 mg | ORAL_TABLET | Freq: Every day | ORAL | 0 refills | Status: DC

## 2018-03-28 NOTE — ED Provider Notes (Signed)
[name]    CSN: [id number] Arrival date & time: 03/28/18  1235     History   Chief Complaint Chief Complaint  Patient presents with  . Facial Pain    HPI Laura TEBBETTS is a 42 y.o. female.   HPI  Laura [name] is a 42 y.o. female presenting to UC with c/o 2 weeks worsening sinus congestion, pain and pressure. Frontal headache radiating behind her eyes. Associated post-nasal drip and green nasal discharge. Denies fever, chills, n/v/d. Her husband has also been sick recently.    Past Medical History:  Diagnosis Date  . Anemia   . Anxiety   . Depression   . GERD (gastroesophageal reflux disease)    sees Dr. Ludwig Lean at [place]   . Headache(784.0)   . Hypothyroidism    sees Dr. Malachy Mood in [place]   . IBS (irritable bowel syndrome)    sees Dr. Eusebio Friendly in [place]   . Insomnia   . Pituitary microadenoma with hyperprolactinemia [facility])    sees Dr. Mare Ferrari in [place]  . Restless legs   . Thyroid disease   . UTI (lower urinary tract infection)    sees Dr Karsten Ro    Patient Active Problem List   Diagnosis Date Noted  . Depression with anxiety 03/05/2018  . Hypothyroidism 08/27/2017  . Intertrigo 07/18/2017  . Trochanteric bursitis, left hip 12/10/2016  . Right Achilles tendinitis 12/10/2016  . Renal stone 04/04/2016  . Fibromyalgia 07/28/2015  . Carpal tunnel syndrome 04/20/2015  . Labral tear of hip, degenerative 02/07/2015  . Adenopathy 10/26/2014  . Lumbar radiculopathy 05/04/2014  . Cubital tunnel syndrome on left 05/04/2014  . Plantar fascial fibromatosis 04/06/2014  . Left shoulder pain 02/17/2014  . Hypersomnia, unspecified 01/11/2014  . Pes anserine bursitis 12/07/2012  . Right C8 Cervical radiculitis 08/26/2012  . Right shoulder pain 08/26/2012  . Pituitary microadenoma with hyperprolactinemia ([place]) 07/21/2012  . Irritable bowel syndrome 02/25/2008  . GERD 01/15/2008    Past Surgical History:    Procedure Laterality Date  . ABDOMINAL HYSTERECTOMY  Nov 2008  . COLONOSCOPY  08-01-14   per Dr. Cindee Salt, tubular adenoma, repeat in 5 yrs   . ESOPHAGOGASTRODUODENOSCOPY  08-01-14   per Dr. Cindee Salt, gastritis only   . [place]   L side lymph node removal  . TONSILLECTOMY    . TUBAL LIGATION    . WISDOM TOOTH EXTRACTION      OB History   None      Home Medications    Prior to Admission medications   Medication Sig Start Date End Date Taking? Authorizing Provider  brimonidine (ALPHAGAN) 0.15 % ophthalmic solution Place 1 drop into both eyes daily.  07/24/17  Yes [provider]  buPROPion (WELLBUTRIN XL) 300 MG 24 hr tablet Take 1 tablet (300 mg total) by mouth every morning. 03/05/18  Yes Laurey Morale, MD  cabergoline (DOSTINEX) 0.5 MG tablet Take 0.25 mg by mouth 2 (two) times a week.    Yes [provider]  DEXILANT 60 MG capsule Take 1 tablet by mouth daily. 09/09/14  Yes [provider]  doxycycline (VIBRAMYCIN) 100 MG capsule Take 100 mg by mouth daily.   Yes [provider]  FLUoxetine (PROZAC) 20 MG tablet Take 1 tablet (20 mg total) by mouth daily. 03/05/18  Yes Laurey Morale, MD  levothyroxine Wilmer Floor, LEVOTHROID) 50 MCG tablet Takes 2 tablets on Mondays and Fridays 04/25/15  Yes [provider]  lubiprostone (AMITIZA) 24 MCG capsule Take 24 mcg by mouth 2 (two) times daily with a meal.   Yes [provider]  azithromycin (ZITHROMAX) 250 MG tablet Take 1 tablet (250 mg total) by mouth daily. Take first 2 tablets together, then 1 every day until finished. 03/28/18   Noe Gens, PA-C  neomycin-polymyxin b-dexamethasone (MAXITROL) 3.5-10000-0.1 OINT APPLY TO EYELID BOTH EYES TWICE A DAY FOR 2 WEEKS 05/05/17   Laurey Morale, MD  RESTASIS MULTIDOSE 0.05 % ophthalmic emulsion  07/24/17   [provider]    Family History Family History  Problem Relation Age of Onset  . Alcohol abuse Father   .  Hypertension Father   . Hypertension Mother   . Thyroid disease Mother        maternal grandmother  . Arrhythmia Mother   . Coronary artery disease Maternal Grandmother        paternal grandmother  . Hypertension Maternal Grandmother   . Diabetes Maternal Grandfather   . Hypertension Maternal Grandfather   . Aneurysm Paternal Grandfather        aortic  . Brain cancer Brother   . Colon cancer Neg Hx   . Esophageal cancer Neg Hx   . Stomach cancer Neg Hx     Social History Social History   Tobacco Use  . Smoking status: Never Smoker  . Smokeless tobacco: Never Used  Substance Use Topics  . Alcohol use: No    Alcohol/week: 0.0 standard drinks  . Drug use: No     Allergies   Magnesium sulfate; Dilaudid [hydromorphone hcl]; Hydromorphone hcl; Iohexol; Ivp dye [iodinated diagnostic agents]; Penicillins; and Levofloxacin   Review of Systems Review of Systems  Constitutional: Negative for appetite change, chills and fever.  Respiratory: Positive for cough. Negative for chest tightness and shortness of breath.   Gastrointestinal: Negative for diarrhea, nausea and vomiting.  Musculoskeletal: Negative for neck pain and neck stiffness.  Neurological: Positive for headaches. Negative for dizziness and light-headedness.     Physical Exam Triage Vital Signs ED Triage Vitals  Enc Vitals Group     BP 03/28/18 1312 112/75     Pulse Rate 03/28/18 1312 (!) 104     Resp --      Temp 03/28/18 1312 97.9 F (36.6 C)     Temp Source 03/28/18 1312 Oral     SpO2 03/28/18 1312 97 %     Weight 03/28/18 1313 179 lb 1.9 oz (81.2 kg)     Height --      Head Circumference --      Peak Flow --      Pain Score 03/28/18 1313 2     Pain Loc --      Pain Edu? --      Excl. in [place]? --    No data found.  Updated Vital Signs BP 112/75 (BP Location: Right Arm)   Pulse (!) 104   Temp 97.9 F (36.6 C) (Oral)   Wt 179 lb 1.9 oz (81.2 kg)   SpO2 97%   BMI 27.24 kg/m   Visual  Acuity Right Eye Distance:   Left Eye Distance:   Bilateral Distance:    Right Eye Near:   Left Eye Near:    Bilateral Near:     Physical Exam  Constitutional: She is oriented to person, place, and time. She appears well-developed and well-nourished.  HENT:  Head: Normocephalic and atraumatic.  Right Ear: Tympanic membrane normal.  Left  Ear: Tympanic membrane normal.  Nose: Mucosal edema present. Right sinus exhibits maxillary sinus tenderness and frontal sinus tenderness. Left sinus exhibits maxillary sinus tenderness and frontal sinus tenderness.  Mouth/Throat: Uvula is midline, oropharynx is clear and moist and mucous membranes are normal.  Eyes: EOM are normal.  Neck: Normal range of motion. Neck supple.  Cardiovascular: Normal rate and regular rhythm.  Pulmonary/Chest: Effort normal and breath sounds normal. No stridor. No respiratory distress. She has no wheezes. She has no rales.  Musculoskeletal: Normal range of motion.  Lymphadenopathy:    She has no cervical adenopathy.  Neurological: She is alert and oriented to person, place, and time.  Skin: Skin is warm and dry.  Psychiatric: She has a normal mood and affect. Her behavior is normal.  Nursing note and vitals reviewed.    UC Treatments / Results  Labs (all labs ordered are listed, but only abnormal results are displayed) Labs Reviewed - No data to display  EKG None  Radiology No results found.  Procedures Procedures (including critical care time)  Medications Ordered in UC Medications - No data to display  Initial Impression / Assessment and Plan / UC Course  I have reviewed the triage vital signs and the nursing notes.  Pertinent labs & imaging results that were available during my care of the patient were reviewed by me and considered in my medical decision making (see chart for details).     Hx and exam c/w sinusitis Will tx with azithromycin, pt allergic to PCN- hives  Final Clinical  Impressions(s) / UC Diagnoses   Final diagnoses:  Acute bacterial sinusitis     Discharge Instructions      Please take antibiotics as prescribed and be sure to complete entire course even if you start to feel better to ensure infection does not come back.  Please follow up with family medicine in 1 week if not improving.     ED Prescriptions    Medication Sig Dispense Auth. Provider   azithromycin (ZITHROMAX) 250 MG tablet Take 1 tablet (250 mg total) by mouth daily. Take first 2 tablets together, then 1 every day until finished. 6 tablet Noe Gens, PA-C     Controlled Substance Prescriptions [place] Controlled Substance Registry consulted? Not Applicable   Tyrell Antonio 03/28/18 [id number]

## 2018-03-28 NOTE — Discharge Instructions (Signed)
°  Please take antibiotics as prescribed and be sure to complete entire course even if you start to feel better to ensure infection does not come back. ° °Please follow up with family medicine in 1 week if not improving.  °

## 2018-03-28 NOTE — ED Triage Notes (Signed)
Here with 2 weeks of sinus sx's- frontal h/a radiating behind eyes, post nasal drip, greenish/red nasal drainage. Denies fever, chills,v.

## 2018-03-31 ENCOUNTER — Other Ambulatory Visit: Payer: Self-pay | Admitting: Family Medicine

## 2018-04-01 ENCOUNTER — Ambulatory Visit: Payer: Commercial Managed Care - PPO | Admitting: Family Medicine

## 2018-04-01 ENCOUNTER — Encounter: Payer: Self-pay | Admitting: Family Medicine

## 2018-04-01 VITALS — BP 118/72 | HR 69 | Temp 98.4°F | Wt 181.5 lb

## 2018-04-01 DIAGNOSIS — M24159 Other articular cartilage disorders, unspecified hip: Secondary | ICD-10-CM

## 2018-04-01 DIAGNOSIS — F418 Other specified anxiety disorders: Secondary | ICD-10-CM | POA: Diagnosis not present

## 2018-04-01 DIAGNOSIS — M7062 Trochanteric bursitis, left hip: Secondary | ICD-10-CM

## 2018-04-01 DIAGNOSIS — M7661 Achilles tendinitis, right leg: Secondary | ICD-10-CM

## 2018-04-01 DIAGNOSIS — I8392 Asymptomatic varicose veins of left lower extremity: Secondary | ICD-10-CM

## 2018-04-01 DIAGNOSIS — M169 Osteoarthritis of hip, unspecified: Secondary | ICD-10-CM | POA: Diagnosis not present

## 2018-04-01 NOTE — Progress Notes (Signed)
   Subjective:    Patient ID: Laura [name], female    DOB: 1975-11-24, 42 y.o.   MRN: [id number]  HPI Here to follow up on depression with anxiety. We saw her a few weeks ago when she described fatigue, sadness, and feeling like stress was building up within her. She had not seen her therapsit for quite a while. We started her on Prozac and advised her to get back in touch with her therapist. Since then she has met with the therapist twice and she has found this very helpful. They are dealing with a lot of feelings she had been bottling up inside. She is not pleased with the Prozac however. She says it makes her feel numb and without emotions. She is sleeping better. She also has other issues today. For several months she has had frequent pain in the right Achilles tendon and the right heel. Ibuprofen helps a little. She also has chronic pain in the left hip. An MRI in 2016 revealed a labral tear, and she had seen Dr. Dianah Field in [place] for awhile. Lastly she has a small knot in the left lower leg that gets tender at times.    Review of Systems  Constitutional: Negative.   Respiratory: Negative.   Cardiovascular: Negative.   Musculoskeletal: Positive for arthralgias.  Neurological: Negative.   Psychiatric/Behavioral: Positive for dysphoric mood. Negative for agitation, behavioral problems, confusion, decreased concentration and sleep disturbance. The patient is nervous/anxious.        Objective:   Physical Exam  Constitutional: She is oriented to person, place, and time. She appears well-developed and well-nourished.  Cardiovascular: Normal rate, regular rhythm, normal heart sounds and intact distal pulses.  Pulmonary/Chest: Effort normal and breath sounds normal.  Musculoskeletal:  She is tender along the inferior Achilles tendon on the left, no swelling or warmth. The left hip is normal with no tenderness and full ROM. The left lower anterior leg has a small tender lump under  the skin. No visible lesions   Neurological: She is alert and oriented to person, place, and time.  Psychiatric: She has a normal mood and affect. Her behavior is normal. Thought content normal.          Assessment & Plan:  For the depression and anxiety, she is having side effects from the Prozac so we agreed to stop it. She will taper off by taking 1/2 tab (10 mg) daily for one week and then stopping it. She is benefiting from psychotherapy so I encouraged her to continue this on a regular basis. She has some Achilles tendonitis, and I advised her to apply ice, wear supportive shoes, and do stretching exercises TID. She can see Dr. Dianah Field for this and also for the chronic left hip pain. The left lower leg has a small venous varicosity. She will observe this for now.  [name], MD

## 2018-04-03 ENCOUNTER — Ambulatory Visit: Payer: Commercial Managed Care - PPO | Admitting: Family Medicine

## 2018-05-04 ENCOUNTER — Ambulatory Visit (INDEPENDENT_AMBULATORY_CARE_PROVIDER_SITE_OTHER): Payer: Commercial Managed Care - PPO | Admitting: Sports Medicine

## 2018-05-04 ENCOUNTER — Ambulatory Visit (INDEPENDENT_AMBULATORY_CARE_PROVIDER_SITE_OTHER): Payer: Commercial Managed Care - PPO

## 2018-05-04 ENCOUNTER — Encounter: Payer: Self-pay | Admitting: Sports Medicine

## 2018-05-04 DIAGNOSIS — M545 Low back pain: Secondary | ICD-10-CM

## 2018-05-04 DIAGNOSIS — M5416 Radiculopathy, lumbar region: Secondary | ICD-10-CM

## 2018-05-04 MED ORDER — DIAZEPAM 5 MG PO TABS: ORAL_TABLET | ORAL | 0 refills | Status: DC

## 2018-05-04 MED ORDER — ACETAMINOPHEN ER 650 MG PO TBCR: 650.0000 mg | EXTENDED_RELEASE_TABLET | Freq: Three times a day (TID) | ORAL | 3 refills | Status: DC | PRN

## 2018-05-04 MED ORDER — IBUPROFEN 800 MG PO TABS: 800.0000 mg | ORAL_TABLET | Freq: Three times a day (TID) | ORAL | 2 refills | Status: DC | PRN

## 2018-05-04 NOTE — Assessment & Plan Note (Signed)
Alternating right and left L5 versus S1 radicular symptoms. Repeat MRI today with x-rays. Previous MRI 2 years ago did not show any evidence of neuroforaminal stenosis. I would like neurology to weigh in with nerve conduction and EMG, Valium for preprocedural anxiolysis. In the meantime she will do ibuprofen 800 alternating with Tylenol 650.

## 2018-05-04 NOTE — Progress Notes (Signed)
Subjective:    CC: Back and leg pain  HPI: Laura [name] returns, she is a 42 year old female, we have treated her in the past for pain radiating from her back down her legs, MRI at the time showed no evidence of neuroforaminal stenosis.  This recovered, more recently she is had pain in her low back with radiation down the left leg to the outside toes with numbness and tingling, back pain is worse with most positions, no bowel or bladder dysfunction, no saddle numbness, constitutional symptoms.  I reviewed the past medical history, family history, social history, surgical history, and allergies today and no changes were needed.  Please see the problem list section below in epic for further details.  Past Medical History: Past Medical History:  Diagnosis Date  . Anemia   . Anxiety   . Depression   . GERD (gastroesophageal reflux disease)    sees Dr. Ludwig Lean at [place]   . Headache(784.0)   . Hypothyroidism    sees Dr. Malachy Mood in [place]   . IBS (irritable bowel syndrome)    sees Dr. Eusebio Friendly in [place]   . Insomnia   . Pituitary microadenoma with hyperprolactinemia [facility])    sees Dr. Mare Ferrari in [place]  . Restless legs   . Thyroid disease   . UTI (lower urinary tract infection)    sees Dr Karsten Ro   Past Surgical History: Past Surgical History:  Procedure Laterality Date  . ABDOMINAL HYSTERECTOMY  Nov 2008  . COLONOSCOPY  08-01-14   per Dr. Cindee Salt, tubular adenoma, repeat in 5 yrs   . ESOPHAGOGASTRODUODENOSCOPY  08-01-14   per Dr. Cindee Salt, gastritis only   . [place]   L side lymph node removal  . TONSILLECTOMY    . TUBAL LIGATION    . WISDOM TOOTH EXTRACTION     Social History: Social History   Socioeconomic History  . Marital status: Single    Spouse name: Not on file  . Number of children: 2  . Years of education: Not on file  . Highest education level: Not on file  Occupational History  . Occupation: Product/process development scientist:  [employer]  . Financial resource strain: Not on file  . Food insecurity:    Worry: Not on file    Inability: Not on file  . Transportation needs:    Medical: Not on file    Non-medical: Not on file  Tobacco Use  . Smoking status: Never Smoker  . Smokeless tobacco: Never Used  Substance and Sexual Activity  . Alcohol use: No    Alcohol/week: 0.0 standard drinks  . Drug use: No  . Sexual activity: Not on file  Lifestyle  . Physical activity:    Days per week: Not on file    Minutes per session: Not on file  . Stress: Not on file  Relationships  . Social connections:    Talks on phone: Not on file    Gets together: Not on file    Attends religious service: Not on file    Active member of club or organization: Not on file    Attends meetings of clubs or organizations: Not on file    Relationship status: Not on file  Other Topics Concern  . Not on file  Social History Narrative   Lives with husband and 2 children in a 2 story home.    Works as an Optometrist.  Education: [employer]   Family  History: Family History  Problem Relation Age of Onset  . Alcohol abuse Father   . Hypertension Father   . Hypertension Mother   . Thyroid disease Mother        maternal grandmother  . Arrhythmia Mother   . Coronary artery disease Maternal Grandmother        paternal grandmother  . Hypertension Maternal Grandmother   . Diabetes Maternal Grandfather   . Hypertension Maternal Grandfather   . Aneurysm Paternal Grandfather        aortic  . Brain cancer Brother   . Colon cancer Neg Hx   . Esophageal cancer Neg Hx   . Stomach cancer Neg Hx    Allergies: Allergies  Allergen Reactions  . Magnesium Sulfate Shortness Of Breath  . Penicillins Hives  . Dilaudid [Hydromorphone Hcl]   . Hydromorphone Hcl   . Iohexol Other (See Comments)    Upper respiratory (sneezing) (IV contrast )  . Ivp Dye [Iodinated Diagnostic Agents]   . Levofloxacin Rash   Medications: See med  rec.  Review of Systems: No fevers, chills, night sweats, weight loss, chest pain, or shortness of breath.   Objective:    General: Well Developed, well nourished, and in no acute distress.  Neuro: Alert and oriented x3, extra-ocular muscles intact, sensation grossly intact.  HEENT: Normocephalic, atraumatic, pupils equal round reactive to light, neck supple, no masses, no lymphadenopathy, thyroid nonpalpable.  Skin: Warm and dry, no rashes. Cardiac: Regular rate and rhythm, no murmurs rubs or gallops, no lower extremity edema.  Respiratory: Clear to auscultation bilaterally. Not using accessory muscles, speaking in full sentences.  Impression and Recommendations:    Lumbar radiculopathy Alternating right and left L5 versus S1 radicular symptoms. Repeat MRI today with x-rays. Previous MRI 2 years ago did not show any evidence of neuroforaminal stenosis. I would like neurology to weigh in with nerve conduction and EMG, Valium for preprocedural anxiolysis. In the meantime she will do ibuprofen 800 alternating with Tylenol 650. ___________________________________________ Gwen Her. Dianah Field, M.D., ABFM., CAQSM. Primary Care and Sports Medicine [facility]  Adjunct Professor of [employer] of [facility] of Medicine

## 2018-05-11 ENCOUNTER — Ambulatory Visit (INDEPENDENT_AMBULATORY_CARE_PROVIDER_SITE_OTHER): Payer: Commercial Managed Care - PPO

## 2018-05-11 DIAGNOSIS — M5416 Radiculopathy, lumbar region: Secondary | ICD-10-CM | POA: Diagnosis not present

## 2018-06-18 ENCOUNTER — Encounter: Payer: Commercial Managed Care - PPO | Admitting: Diagnostic Neuroimaging

## 2018-06-18 ENCOUNTER — Ambulatory Visit (INDEPENDENT_AMBULATORY_CARE_PROVIDER_SITE_OTHER): Payer: Commercial Managed Care - PPO | Admitting: Diagnostic Neuroimaging

## 2018-06-18 DIAGNOSIS — M79605 Pain in left leg: Secondary | ICD-10-CM | POA: Diagnosis not present

## 2018-06-18 DIAGNOSIS — Z0289 Encounter for other administrative examinations: Secondary | ICD-10-CM

## 2018-06-19 NOTE — Procedures (Signed)
[facility]  NCS (NERVE CONDUCTION STUDY) WITH EMG (ELECTROMYOGRAPHY) REPORT   STUDY DATE: 06/18/18 PATIENT NAME: Laura [name] DOB: 1975-07-14 MRN: [id number]  ORDERING CLINICIAN: Dwyane Dee, MD  TECHNOLOGIST: Belinda Block ELECTROMYOGRAPHER: Earlean Polka. Allana Shrestha, MD  CLINICAL INFORMATION: 43 year old female with left leg pain for past 3 years.   FINDINGS: NERVE CONDUCTION STUDY: Bilateral peroneal and tibial motor responses are normal.  Bilateral sural and superficial peroneal sensory responses are normal.  Bilateral tibial F wave latencies normal.  NEEDLE ELECTROMYOGRAPHY:  Needle examination of left lower extremity is normal.   IMPRESSION:   This is a normal study.  No electrodiagnostic evidence of large fiber neuropathy or lumbar radiculopathies time.    INTERPRETING PHYSICIAN:  Penni Bombard, MD Certified in Neurology, Neurophysiology and Neuroimaging  [facility] [address], [place], [place] [address] [phone]   [facility]    Nerve / Sites Muscle Latency Ref. Amplitude Ref. Rel Amp Segments Distance Velocity Ref. Area    ms ms mV mV %  cm m/s m/s mVms  R Peroneal - EDB     Ankle EDB 4.7 ?6.5 4.7 ?2.0 100 Ankle - EDB 9   18.1     Fib head EDB 11.6  4.4  94.4 Fib head - Ankle 34 49 ?44 17.8     Pop fossa EDB 13.7  4.3  97.4 Pop fossa - Fib head 10 48 ?44 17.6         Pop fossa - Ankle      L Peroneal - EDB     Ankle EDB 5.1 ?6.5 3.9 ?2.0 100 Ankle - EDB 9   15.5     Fib head EDB 11.9  3.8  96.7 Fib head - Ankle 34 49 ?44 15.1     Pop fossa EDB 13.9  3.3  87.8 Pop fossa - Fib head 10 51 ?44 13.9         Pop fossa - Ankle      R Tibial - AH     Ankle AH 3.4 ?5.8 7.8 ?4.0 100 Ankle - AH 9   23.5     Pop fossa AH 12.0  6.1  78.7 Pop fossa - Ankle 37 43 ?41 20.0  L Tibial - AH     Ankle AH 4.0 ?5.8 10.7 ?4.0 100 Ankle - AH 9   23.5     Pop fossa AH 12.7  8.3  77.5 Pop fossa - Ankle 37 43 ?41 23.1               SNC    Nerve / Sites Rec. Site Peak Lat Ref.  Amp Ref. Segments Distance    ms ms V V  cm  R Sural - Ankle (Calf)     Calf Ankle 3.2 ?4.4 10 ?6 Calf - Ankle 14  L Sural - Ankle (Calf)     Calf Ankle 3.9 ?4.4 12 ?6 Calf - Ankle 14  R Superficial peroneal - Ankle     Lat leg Ankle 3.8 ?4.4 10 ?6 Lat leg - Ankle 14  L Superficial peroneal - Ankle     Lat leg Ankle 4.0 ?4.4 9 ?6 Lat leg - Ankle 14              F  Wave    Nerve F Lat Ref.   ms ms  R Tibial - AH 50.8 ?56.0  L Tibial - AH 52.1 ?56.0  EMG full       EMG Summary Table    Spontaneous MUAP Recruitment  Muscle IA Fib PSW Fasc Other Amp Dur. Poly Pattern  L. Vastus medialis Normal None None None _______ Normal Normal Normal Normal  L. Tibialis anterior Normal None None None _______ Normal Normal Normal Normal  L. Gastrocnemius (Medial head) Normal None None None _______ Normal Normal Normal Normal  L. Lumbar paraspinals Normal None None None _______ Normal Normal Normal Normal

## 2018-08-12 ENCOUNTER — Encounter: Payer: Self-pay | Admitting: Sports Medicine

## 2018-08-12 ENCOUNTER — Ambulatory Visit (INDEPENDENT_AMBULATORY_CARE_PROVIDER_SITE_OTHER): Payer: Commercial Managed Care - PPO | Admitting: Sports Medicine

## 2018-08-12 DIAGNOSIS — M7062 Trochanteric bursitis, left hip: Secondary | ICD-10-CM | POA: Diagnosis not present

## 2018-08-12 NOTE — Assessment & Plan Note (Signed)
Left trochanteric bursa injection today. She does have somewhat of a bulbous, degenerated appearance of her left hip labrum. If recurrence of discomfort then I would recommend that we have her talk to 1 of the hip arthroscopists at [facility]. She had a negative lumbar spine MRI and a negative nerve conduction/EMG. Return in a month to discuss this again, she did not respond well due to intolerability of gabapentin and Lyrica.

## 2018-08-12 NOTE — Progress Notes (Signed)
Subjective:    CC: Left hip pain  HPI: This is a pleasant 43 year old female, she has multifactorial left hip pain, we diagnosed her with a hip labral degenerative tear several years ago.  She was thought to not be a candidate for hip arthroscopy.  She has also had several episodes of trochanteric bursitis, these episodes improved considerably for several months with injections.  She was also having some pain going down her left hip, to the feet, ultimately we obtained an MRI that showed no evidence of neuroforaminal stenosis, nerve conduction and EMG of the lower extremities was completely negative.  Her pain today is mostly over the greater trochanter, severe, localized without radiation.  I reviewed the past medical history, family history, social history, surgical history, and allergies today and no changes were needed.  Please see the problem list section below in epic for further details.  Past Medical History: Past Medical History:  Diagnosis Date  . Anemia   . Anxiety   . Depression   . GERD (gastroesophageal reflux disease)    sees Dr. Ludwig Lean at [place]   . Headache(784.0)   . Hypothyroidism    sees Dr. Malachy Mood in [place]   . IBS (irritable bowel syndrome)    sees Dr. Eusebio Friendly in [place]   . Insomnia   . Pituitary microadenoma with hyperprolactinemia [facility])    sees Dr. Mare Ferrari in [place]  . Restless legs   . Thyroid disease   . UTI (lower urinary tract infection)    sees Dr Karsten Ro   Past Surgical History: Past Surgical History:  Procedure Laterality Date  . ABDOMINAL HYSTERECTOMY  Nov 2008  . COLONOSCOPY  08-01-14   per Dr. Cindee Salt, tubular adenoma, repeat in 5 yrs   . ESOPHAGOGASTRODUODENOSCOPY  08-01-14   per Dr. Cindee Salt, gastritis only   . [place]   L side lymph node removal  . TONSILLECTOMY    . TUBAL LIGATION    . WISDOM TOOTH EXTRACTION     Social History: Social History   Socioeconomic History  . Marital  status: Single    Spouse name: Not on file  . Number of children: 2  . Years of education: Not on file  . Highest education level: Not on file  Occupational History  . Occupation: Product/process development scientist: [employer]  . Financial resource strain: Not on file  . Food insecurity:    Worry: Not on file    Inability: Not on file  . Transportation needs:    Medical: Not on file    Non-medical: Not on file  Tobacco Use  . Smoking status: Never Smoker  . Smokeless tobacco: Never Used  Substance and Sexual Activity  . Alcohol use: No    Alcohol/week: 0.0 standard drinks  . Drug use: No  . Sexual activity: Not on file  Lifestyle  . Physical activity:    Days per week: Not on file    Minutes per session: Not on file  . Stress: Not on file  Relationships  . Social connections:    Talks on phone: Not on file    Gets together: Not on file    Attends religious service: Not on file    Active member of club or organization: Not on file    Attends meetings of clubs or organizations: Not on file    Relationship status: Not on file  Other Topics Concern  . Not on file  Social History Narrative   Lives with husband and 2 children in a 2 story home.    Works as an Optometrist.  Education: MBA   Family History: Family History  Problem Relation Age of Onset  . Alcohol abuse Father   . Hypertension Father   . Hypertension Mother   . Thyroid disease Mother        maternal grandmother  . Arrhythmia Mother   . Coronary artery disease Maternal Grandmother        paternal grandmother  . Hypertension Maternal Grandmother   . Diabetes Maternal Grandfather   . Hypertension Maternal Grandfather   . Aneurysm Paternal Grandfather        aortic  . Brain cancer Brother   . Colon cancer Neg Hx   . Esophageal cancer Neg Hx   . Stomach cancer Neg Hx    Allergies: Allergies  Allergen Reactions  . Magnesium Sulfate Shortness Of Breath  . Penicillins Hives  . Dilaudid  [Hydromorphone Hcl]   . Hydromorphone Hcl   . Iohexol Other (See Comments)    Upper respiratory (sneezing) (IV contrast )  . Ivp Dye [Iodinated Diagnostic Agents]   . Levofloxacin Rash   Medications: See med rec.  Review of Systems: No fevers, chills, night sweats, weight loss, chest pain, or shortness of breath.   Objective:    General: Well Developed, well nourished, and in no acute distress.  Neuro: Alert and oriented x3, extra-ocular muscles intact, sensation grossly intact.  HEENT: Normocephalic, atraumatic, pupils equal round reactive to light, neck supple, no masses, no lymphadenopathy, thyroid nonpalpable.  Skin: Warm and dry, no rashes. Cardiac: Regular rate and rhythm, no murmurs rubs or gallops, no lower extremity edema.  Respiratory: Clear to auscultation bilaterally. Not using accessory muscles, speaking in full sentences. Left hip: ROM IR: 60 Deg, ER: 60 Deg, Flexion: 120 Deg, Extension: 100 Deg, Abduction: 45 Deg, Adduction: 45 Deg Strength IR: 5/5, ER: 5/5, Flexion: 5/5, Extension: 5/5, Abduction: 5/5, Adduction: 5/5 Pelvic alignment unremarkable to inspection and palpation. Standing hip rotation and gait without trendelenburg / unsteadiness. Greater trochanter with severe tenderness to palpation. No tenderness over piriformis. No SI joint tenderness and normal minimal SI movement.  Procedure: Real-time Ultrasound Guided injection of the left greater trochanteric bursa Device: GE Logiq E  Verbal informed consent obtained.  Time-out conducted.  Noted no overlying erythema, induration, or other signs of local infection.  Skin prepped in a sterile fashion.  Local anesthesia: Topical Ethyl chloride.  With sterile technique and under real time ultrasound guidance:  22-gauge spinal needle advanced just deep to the gluteus medius insertion at the greater trochanter, I then injected 1 cc Kenalog 40, 2 cc lidocaine, 2 cc bupivacaine Completed without difficulty  Pain  immediately resolved suggesting accurate placement of the medication.  Advised to call if fevers/chills, erythema, induration, drainage, or persistent bleeding.  Images permanently stored and available for review in the ultrasound unit.  Impression: Technically successful ultrasound guided injection.  Impression and Recommendations:    Trochanteric bursitis, left hip Left trochanteric bursa injection today. She does have somewhat of a bulbous, degenerated appearance of her left hip labrum. If recurrence of discomfort then I would recommend that we have her talk to 1 of the hip arthroscopists at [facility]. She had a negative lumbar spine MRI and a negative nerve conduction/EMG. Return in a month to discuss this again, she did not respond well due to intolerability of gabapentin and Lyrica. ___________________________________________ Gwen Her. Dianah Field, M.D.,  ABFM., CAQSM. Primary Care and Sports Medicine [facility]  Adjunct Professor of [employer] of [employer] [facility] of Medicine

## 2018-09-09 ENCOUNTER — Other Ambulatory Visit: Payer: Self-pay | Admitting: Sports Medicine

## 2018-09-09 DIAGNOSIS — M5416 Radiculopathy, lumbar region: Secondary | ICD-10-CM

## 2018-09-14 ENCOUNTER — Telehealth: Payer: Commercial Managed Care - PPO | Admitting: Sports Medicine

## 2018-12-29 ENCOUNTER — Other Ambulatory Visit: Payer: Self-pay

## 2018-12-29 MED ORDER — BUPROPION HCL ER (XL) 300 MG PO TB24: 300.0000 mg | ORAL_TABLET | Freq: Every morning | ORAL | 0 refills | Status: DC

## 2019-02-11 ENCOUNTER — Encounter: Payer: Self-pay | Admitting: Sports Medicine

## 2019-02-11 ENCOUNTER — Ambulatory Visit (INDEPENDENT_AMBULATORY_CARE_PROVIDER_SITE_OTHER): Payer: Commercial Managed Care - PPO | Admitting: Sports Medicine

## 2019-02-11 ENCOUNTER — Other Ambulatory Visit: Payer: Self-pay

## 2019-02-11 DIAGNOSIS — S76302A Unspecified injury of muscle, fascia and tendon of the posterior muscle group at thigh level, left thigh, initial encounter: Secondary | ICD-10-CM

## 2019-02-11 NOTE — Progress Notes (Signed)
Subjective:    CC: Left buttock pain  HPI: For the past few months this pleasant 43 year old female has had pain that she localizes deep in the left buttock, worse when getting up, walking uphill.  No radiation.  No trauma.  Moderate, persistent.  I reviewed the past medical history, family history, social history, surgical history, and allergies today and no changes were needed.  Please see the problem list section below in epic for further details.  Past Medical History: Past Medical History:  Diagnosis Date  . Anemia   . Anxiety   . Depression   . GERD (gastroesophageal reflux disease)    sees Dr. Ludwig Lean at [place]   . Headache(784.0)   . Hypothyroidism    sees Dr. Malachy Mood in [place]   . IBS (irritable bowel syndrome)    sees Dr. Eusebio Friendly in [place]   . Insomnia   . Pituitary microadenoma with hyperprolactinemia [facility])    sees Dr. Mare Ferrari in [place]  . Restless legs   . Thyroid disease   . UTI (lower urinary tract infection)    sees Dr Karsten Ro   Past Surgical History: Past Surgical History:  Procedure Laterality Date  . ABDOMINAL HYSTERECTOMY  Nov 2008  . COLONOSCOPY  08-01-14   per Dr. Cindee Salt, tubular adenoma, repeat in 5 yrs   . ESOPHAGOGASTRODUODENOSCOPY  08-01-14   per Dr. Cindee Salt, gastritis only   . [place]   L side lymph node removal  . TONSILLECTOMY    . TUBAL LIGATION    . WISDOM TOOTH EXTRACTION     Social History: Social History   Socioeconomic History  . Marital status: Single    Spouse name: Not on file  . Number of children: 2  . Years of education: Not on file  . Highest education level: Not on file  Occupational History  . Occupation: Product/process development scientist: [employer]  . Financial resource strain: Not on file  . Food insecurity    Worry: Not on file    Inability: Not on file  . Transportation needs    Medical: Not on file    Non-medical: Not on file  Tobacco Use  . Smoking  status: Never Smoker  . Smokeless tobacco: Never Used  Substance and Sexual Activity  . Alcohol use: No    Alcohol/week: 0.0 standard drinks  . Drug use: No  . Sexual activity: Not on file  Lifestyle  . Physical activity    Days per week: Not on file    Minutes per session: Not on file  . Stress: Not on file  Relationships  . Social Herbalist on phone: Not on file    Gets together: Not on file    Attends religious service: Not on file    Active member of club or organization: Not on file    Attends meetings of clubs or organizations: Not on file    Relationship status: Not on file  Other Topics Concern  . Not on file  Social History Narrative   Lives with husband and 2 children in a 2 story home.    Works as an Optometrist.  Education: MBA   Family History: Family History  Problem Relation Age of Onset  . Alcohol abuse Father   . Hypertension Father   . Hypertension Mother   . Thyroid disease Mother        maternal grandmother  . Arrhythmia Mother   .  Coronary artery disease Maternal Grandmother        paternal grandmother  . Hypertension Maternal Grandmother   . Diabetes Maternal Grandfather   . Hypertension Maternal Grandfather   . Aneurysm Paternal Grandfather        aortic  . Brain cancer Brother   . Colon cancer Neg Hx   . Esophageal cancer Neg Hx   . Stomach cancer Neg Hx    Allergies: Allergies  Allergen Reactions  . Magnesium Sulfate Shortness Of Breath  . Penicillins Hives  . Dilaudid [Hydromorphone Hcl]   . Hydromorphone Hcl   . Iohexol Other (See Comments)    Upper respiratory (sneezing) (IV contrast )  . Ivp Dye [Iodinated Diagnostic Agents]   . Levofloxacin Rash   Medications: See med rec.  Review of Systems: No fevers, chills, night sweats, weight loss, chest pain, or shortness of breath.   Objective:    General: Well Developed, well nourished, and in no acute distress.  Neuro: Alert and oriented x3, extra-ocular muscles  intact, sensation grossly intact.  HEENT: Normocephalic, atraumatic, pupils equal round reactive to light, neck supple, no masses, no lymphadenopathy, thyroid nonpalpable.  Skin: Warm and dry, no rashes. Cardiac: Regular rate and rhythm, no murmurs rubs or gallops, no lower extremity edema.  Respiratory: Clear to auscultation bilaterally. Not using accessory muscles, speaking in full sentences. Left hip: ROM IR: 60 Deg, ER: 60 Deg, Flexion: 120 Deg, Extension: 100 Deg, Abduction: 45 Deg, Adduction: 45 Deg Strength IR: 5/5, ER: 5/5, Flexion: 5/5, Extension: 5/5, Abduction: 5/5, Adduction: 5/5 Pelvic alignment unremarkable to inspection and palpation. Standing hip rotation and gait without trendelenburg / unsteadiness. Greater trochanter without tenderness to palpation. No tenderness over piriformis. No SI joint tenderness and normal minimal SI movement. Tender to palpation just distal to the ischial tuberosity over the proximal hamstring tenderness, reproduction of pain with resisted left knee flexion and resisted left hip extension.  Impression and Recommendations:    Left upper hamstring syndrome Discomfort present for several months now. Tenderness distal to the ischial tuberosity over the hamstrings. Reproduction of pain with hip extension and resisted knee flexion. Starting the upper hamstring eccentric rehab protocol for the next month, if insufficient improvement we will proceed with MRI and formal therapy. I would like some x-rays today.   ___________________________________________ Gwen Her. Dianah Field, M.D., ABFM., CAQSM. Primary Care and Sports Medicine [facility]  Adjunct Professor of [employer] of [employer] of Medicine

## 2019-02-11 NOTE — Patient Instructions (Signed)
Initial Hamstring Rehab Protocol Hamstring curls: Start with 3 sets of 15 (no weight); Progress by 5 reps every 3 days until you reach 3 sets of 30; After 3 days at 3 sets of 30, add 2lb ankle weight at 3 sets of 10; Increase every 5 days by 5 reps. You may add 2lbs ankle weight once weekly. Hamstring swings- swing leg backwards and curl at the end of the swing. Follow same schedule as above. Hamstring running lunges- running lunge position means no more than 45 degrees of knee flexion and running motion. Follow same schedule as above. 

## 2019-02-11 NOTE — Assessment & Plan Note (Signed)
Discomfort present for several months now. Tenderness distal to the ischial tuberosity over the hamstrings. Reproduction of pain with hip extension and resisted knee flexion. Starting the upper hamstring eccentric rehab protocol for the next month, if insufficient improvement we will proceed with MRI and formal therapy. I would like some x-rays today.

## 2019-02-16 ENCOUNTER — Telehealth: Payer: Self-pay

## 2019-02-16 NOTE — Telephone Encounter (Signed)
Copied from [place] [id number]. Topic: Quick Communication - See Telephone Encounter >> Feb 16, 2019  5:22 PM Loma Boston wrote: CRM for notification. See Telephone encounter for: 02/16/19.pt need you to call concerning a FU after EMT  visit last nite Feels she needs a visit + labs after hrs, pls FU with her [phone]

## 2019-02-17 ENCOUNTER — Encounter: Payer: Self-pay | Admitting: Family Medicine

## 2019-02-17 ENCOUNTER — Other Ambulatory Visit: Payer: Self-pay

## 2019-02-17 ENCOUNTER — Ambulatory Visit: Payer: Commercial Managed Care - PPO | Admitting: Family Medicine

## 2019-02-17 VITALS — BP 108/62 | HR 77 | Temp 98.2°F | Wt 191.0 lb

## 2019-02-17 DIAGNOSIS — R55 Syncope and collapse: Secondary | ICD-10-CM

## 2019-02-17 NOTE — Telephone Encounter (Signed)
Patient stated that she was at a cookout on Monday and started to feel sick. She had a bad stomach ache and felt faint. She ate breakfast that morning around 11 and ate again around 4pm. She stated that she had 2 alcohol drinks and thinks they may have affected her BG. She states she does not remember much of what happened. EMS told her she was unresponsive. Unsure if she lost conciseness. States that she could hear things going on but was unaware of what was happening. Patient refused an ED visit and was told by EMS to follow up with her PCP. BG was 101 BP 72/47   Patient has been scheduled for an in office appointment today at 4:00.  Will send to Dr. Sarajane Jews as Juluis [place].

## 2019-02-17 NOTE — Telephone Encounter (Signed)
She was seen OV today

## 2019-02-17 NOTE — Progress Notes (Signed)
   Subjective:    Patient ID: Laura [name], female    DOB: [birth year], 43 y.o.   MRN: [id number]  HPI Here for an episode of syncope that occurred at home 2 days ago. She ate a breakfast of bacon and eggs with her family about 11:00 that morning. Then about 2 in the afternoon, after consuming 2 alcoholic drinks, she suddenely developed abdominal cramps. She became nauseated but did not vomit. No fever. The cramps became more intense and then she felt faint. She ended up sitting down at a table and laid her head down. For a brief time she was [place] to her husband talking to her, so they laid her on the floor. She quickly reganied consiousness, and EMS was called. Her pulse was regular, BP was 72/47, and her glucose was 101. She slowly regained her strength, and she recovered completely within about 30 minutes. Since then her stomach has felt a little "queasy" but she has been able to eat and drink normally. She does have a hx of IBS. Today she feels fine.    Review of Systems  Constitutional: Negative.   Respiratory: Negative.   Cardiovascular: Negative.   Gastrointestinal: Positive for abdominal pain and nausea. Negative for abdominal distention, anal bleeding, blood in stool, constipation, diarrhea, rectal pain and vomiting.  Genitourinary: Negative.   Neurological: Positive for syncope. Negative for dizziness, tremors, seizures, facial asymmetry, speech difficulty, weakness, light-headedness, numbness and headaches.       Objective:   Physical Exam Constitutional:      General: She is not in acute distress.    Appearance: Normal appearance.  Cardiovascular:     Rate and Rhythm: Normal rate and regular rhythm.     Pulses: Normal pulses.     Heart sounds: Normal heart sounds.  Pulmonary:     Effort: Pulmonary effort is normal.     Breath sounds: Normal breath sounds.  Abdominal:     General: Abdomen is flat. Bowel sounds are normal. There is no distension.     Palpations:  Abdomen is soft. There is no mass.     Tenderness: There is no abdominal tenderness. There is no guarding or rebound.     Hernia: No hernia is present.  Lymphadenopathy:     Cervical: No cervical adenopathy.  Neurological:     General: No focal deficit present.     Mental Status: She is alert and oriented to person, place, and time.           Assessment & Plan:  She had a brief syncopal event that was likely a vasovagal attack brought on by abdominal pain. The etiology of the pain is not clear, possibly a mild viral enteritis. She has been fine since then. She will stay hydrated and return of this happens again. [name], MD

## 2019-02-22 ENCOUNTER — Ambulatory Visit: Payer: Commercial Managed Care - PPO | Admitting: Family Medicine

## 2019-03-16 LAB — HM MAMMOGRAPHY

## 2019-03-18 ENCOUNTER — Ambulatory Visit: Payer: Commercial Managed Care - PPO | Admitting: Sports Medicine

## 2019-03-19 ENCOUNTER — Encounter: Payer: Self-pay | Admitting: *Deleted

## 2019-05-08 ENCOUNTER — Other Ambulatory Visit: Payer: Self-pay | Admitting: Family Medicine

## 2019-06-07 ENCOUNTER — Encounter: Payer: Self-pay | Admitting: Family Medicine

## 2019-06-08 NOTE — Telephone Encounter (Signed)
Yes I would be happy to see her  

## 2019-08-23 ENCOUNTER — Encounter: Payer: Self-pay | Admitting: Family Medicine

## 2019-08-23 NOTE — Telephone Encounter (Signed)
This certainly could be shingles. Taking the Valtrex is a good idea. I do recommend she get the shingles vaccine in the future, but it is indicated for people over age 45

## 2019-08-24 NOTE — Telephone Encounter (Signed)
She will need to wait until she is 44 years of age to get the shingles vaccine. She can get this at her pharmacy, no rx is needed

## 2019-09-25 ENCOUNTER — Other Ambulatory Visit: Payer: Self-pay | Admitting: Family Medicine

## 2019-10-28 ENCOUNTER — Ambulatory Visit (INDEPENDENT_AMBULATORY_CARE_PROVIDER_SITE_OTHER): Payer: Commercial Managed Care - PPO | Admitting: Sports Medicine

## 2019-10-28 ENCOUNTER — Other Ambulatory Visit: Payer: Self-pay

## 2019-10-28 ENCOUNTER — Encounter: Payer: Self-pay | Admitting: Sports Medicine

## 2019-10-28 ENCOUNTER — Telehealth: Payer: Self-pay

## 2019-10-28 DIAGNOSIS — M5416 Radiculopathy, lumbar region: Secondary | ICD-10-CM | POA: Diagnosis not present

## 2019-10-28 NOTE — Progress Notes (Signed)
    Procedures performed today:    None.  Independent interpretation of notes and tests performed by another provider:   We did personally review her MRI again, the left L5 nerve root does appear to be deformed by a soft disc protrusion,  Brief History, Exam, Impression, and Recommendations:    Lumbar radiculopathy This is a pleasant 44 year old female, she continues to have left hip pain, it is multifactorial, she does have history of a labral tear in her left hip, we have done multiple trochanteric bursa injections. More recently her pain runs down her foot to her middle toes. No progressive weakness, bowel or bladder dysfunction, saddle numbness. We did personally review her MRI again, the left L5 nerve root does appear to be deformed by a soft disc protrusion, for this reason we are going to proceed with a left L5-S1 transforaminal epidural for diagnostic and therapeutic purposes. Holding off on Lyrica, she did not tolerate gabapentin. Return to see me 1 month after the injection. Of note her 21yo daughter has stage IV rhabdomyosarcoma of the foot.    ___________________________________________ Gwen Her. Dianah Field, M.D., ABFM., CAQSM. Primary Care and [place] Instructor of [place] of [facility] of Medicine

## 2019-10-28 NOTE — Telephone Encounter (Signed)
Spoke with patient to let her know I called in her 13-hour prep to her [facility] in [place] (in epic).  She was informed to take Prednisone 50mg  PO 11/09/19 @ 1930 and 11/10/19 @ 0130 and 0730; Benadryl 50mg  PO 11/10/19 @ 0730.

## 2019-10-28 NOTE — Assessment & Plan Note (Signed)
This is a pleasant 44 year old female, she continues to have left hip pain, it is multifactorial, she does have history of a labral tear in her left hip, we have done multiple trochanteric bursa injections. More recently her pain runs down her foot to her middle toes. No progressive weakness, bowel or bladder dysfunction, saddle numbness. We did personally review her MRI again, the left L5 nerve root does appear to be deformed by a soft disc protrusion, for this reason we are going to proceed with a left L5-S1 transforaminal epidural for diagnostic and therapeutic purposes. Holding off on Lyrica, she did not tolerate gabapentin. Return to see me 1 month after the injection. Of note her 21yo daughter has stage IV rhabdomyosarcoma of the foot.

## 2019-11-04 ENCOUNTER — Encounter: Payer: Self-pay | Admitting: Family Medicine

## 2019-11-05 MED ORDER — NEOMYCIN-POLYMYXIN-DEXAMETH 3.5-10000-0.1 OP OINT: TOPICAL_OINTMENT | OPHTHALMIC | 0 refills | Status: DC

## 2019-11-10 ENCOUNTER — Other Ambulatory Visit: Payer: Commercial Managed Care - PPO

## 2019-11-19 ENCOUNTER — Ambulatory Visit
Admission: RE | Admit: 2019-11-19 | Discharge: 2019-11-19 | Disposition: A | Discharge status: Home / Self Care | Payer: Commercial Managed Care - PPO | Source: Ambulatory Visit | Attending: Sports Medicine | Admitting: Sports Medicine

## 2019-11-19 DIAGNOSIS — M5416 Radiculopathy, lumbar region: Secondary | ICD-10-CM

## 2019-11-19 MED ORDER — IOPAMIDOL (ISOVUE-M 200) INJECTION 41%
1.0000 mL | Freq: Once | INTRAMUSCULAR | Status: AC
  Administered 2019-11-19: 1 mL via EPIDURAL

## 2019-11-19 MED ORDER — METHYLPREDNISOLONE ACETATE 40 MG/ML INJ SUSP (RADIOLOG
120.0000 mg | Freq: Once | INTRAMUSCULAR | Status: AC
  Administered 2019-11-19: 120 mg via EPIDURAL

## 2019-11-19 NOTE — Discharge Instructions (Signed)

## 2019-11-25 ENCOUNTER — Telehealth (INDEPENDENT_AMBULATORY_CARE_PROVIDER_SITE_OTHER): Payer: Commercial Managed Care - PPO | Admitting: Family Medicine

## 2019-11-25 ENCOUNTER — Encounter: Payer: Self-pay | Admitting: Family Medicine

## 2019-11-25 DIAGNOSIS — R195 Other fecal abnormalities: Secondary | ICD-10-CM | POA: Diagnosis not present

## 2019-11-25 DIAGNOSIS — R300 Dysuria: Secondary | ICD-10-CM | POA: Diagnosis not present

## 2019-11-25 LAB — POCT URINALYSIS DIPSTICK
Bilirubin, UA: NEGATIVE
Blood, UA: NEGATIVE
Glucose, UA: NEGATIVE
Ketones, UA: NEGATIVE
Leukocytes, UA: NEGATIVE
Nitrite, UA: NEGATIVE
Protein, UA: NEGATIVE
Spec Grav, UA: 1.01 (ref 1.010–1.025)
Urobilinogen, UA: 0.2 E.U./dL
pH, UA: 6 (ref 5.0–8.0)

## 2019-11-25 NOTE — Progress Notes (Signed)
Virtual Visit via Video Note Audio on pt's went in an out during call.  I connected with Laura [name] on 11/25/19 at  3:30 PM EDT by a video enabled telemedicine application 2/2 [id number] pandemic and verified that I am speaking with the correct person using two identifiers.  Location patient: home Location provider:work or home office Persons participating in the virtual visit: patient, provider  I discussed the limitations of evaluation and management by telemedicine and the availability of in person appointments. The patient expressed understanding and agreed to proceed.   HPI: Pt is a 44 yo female with pmh sig for GERD, IBS-C, hypothyroidism, h/o anemia, anxiety, depression, fibromyalgia followed by Dr. Sarajane Jews who was seen for acute concern.  Pt with stomach discomfort, urinary urgency and frequency x1 wk.  Last BM was today.  Pt notes typically having constipation, but now having loose stools x1 week. Had some nausea.  Endorses increased stress at work and at home.  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   Pt denies back pain, suprapubic pain, fever, vomiting.  Pt has not had recent f/u with GI.  Has colonoscopy q 5 yrs.     Pt planning to spend the weekend at the beach.  ROS: See pertinent positives and negatives per HPI.  Past Medical History:  Diagnosis Date  . Anemia   . Anxiety   . Depression   . GERD (gastroesophageal reflux disease)    sees Dr. Ludwig Lean at [place]   . Headache(784.0)   . Hypothyroidism    sees Dr. Malachy Mood in [place]   . IBS (irritable bowel syndrome)    sees Dr. Eusebio Friendly in [place]   . Insomnia   . Pituitary microadenoma with hyperprolactinemia [facility])    sees Dr. Mare Ferrari in [place]  . Restless legs   . Thyroid disease   . UTI (lower urinary tract infection)    sees Dr Karsten Ro    Past Surgical History:  Procedure Laterality Date  . ABDOMINAL HYSTERECTOMY  Nov 2008  . COLONOSCOPY   08-01-14   per Dr. Cindee Salt, tubular adenoma, repeat in 5 yrs   . ESOPHAGOGASTRODUODENOSCOPY  08-01-14   per Dr. Cindee Salt, gastritis only   . [place]   L side lymph node removal  . TONSILLECTOMY    . TUBAL LIGATION    . WISDOM TOOTH EXTRACTION      Family History  Problem Relation Age of Onset  . Alcohol abuse Father   . Hypertension Father   . Hypertension Mother   . Thyroid disease Mother        maternal grandmother  . Arrhythmia Mother   . Coronary artery disease Maternal Grandmother        paternal grandmother  . Hypertension Maternal Grandmother   . Diabetes Maternal Grandfather   . Hypertension Maternal Grandfather   . Aneurysm Paternal Grandfather        aortic  . Brain cancer Brother   . Colon cancer Neg Hx   . Esophageal cancer Neg Hx   . Stomach cancer Neg Hx     Current Outpatient Medications:  .  acetaminophen (TYLENOL) 650 MG CR tablet, Take 1 tablet (650 mg total) by mouth every 8 (eight) hours as needed for pain., Disp: 90 tablet, Rfl: 3 .  brimonidine (ALPHAGAN) 0.15 % ophthalmic solution, Place 1 drop into both eyes daily. , Disp: , Rfl: 11 .  buPROPion (WELLBUTRIN XL) 300 MG 24  hr tablet, TAKE 1 TABLET (300 MG TOTAL) BY MOUTH EVERY MORNING. PATIENT NEEDS AN OV FOR FURTHER REFILLS., Disp: 90 tablet, Rfl: 0 .  cabergoline (DOSTINEX) 0.5 MG tablet, Take 0.25 mg by mouth 2 (two) times a week. , Disp: , Rfl:  .  doxycycline (VIBRAMYCIN) 100 MG capsule, Take 100 mg by mouth daily., Disp: , Rfl:  .  ibuprofen (ADVIL,MOTRIN) 800 MG tablet, TAKE 1 TABLET BY MOUTH EVERY 8 HOURS AS NEEDED, Disp: 90 tablet, Rfl: 2 .  Levothyroxine Sodium 75 MCG CAPS, Take by mouth daily before breakfast. Takes 2 tablets on Mondays and Fridays, Disp: , Rfl: 0 .  lubiprostone (AMITIZA) 24 MCG capsule, Take 24 mcg by mouth 2 (two) times daily with a meal., Disp: , Rfl:  .  neomycin-polymyxin b-dexamethasone (MAXITROL) 3.5-10000-0.1 OINT, APPLY TO EYELID BOTH EYES TWICE A DAY FOR 2  WEEKS, Disp: 3.5 g, Rfl: 0 .  PAZEO 0.7 % SOLN, USE 1 DROP INTO BOTH EYES ONCE A DAY, Disp: , Rfl:  .  RESTASIS MULTIDOSE 0.05 % ophthalmic emulsion, , Disp: , Rfl:   EXAM:  VITALS per patient if applicable: RR between 16-60 bpm  GENERAL: alert, oriented, appears well and in no acute distress  HEENT: atraumatic, conjunctiva clear, no obvious abnormalities on inspection of external nose and ears  NECK: normal movements of the head and neck  LUNGS: on inspection no signs of respiratory distress, breathing rate appears normal, no obvious gross SOB, gasping or wheezing  CV: no obvious cyanosis  MS: moves all visible extremities without noticeable abnormality  PSYCH/NEURO: pleasant and cooperative, no obvious depression or anxiety, speech and thought processing grossly intact  ASSESSMENT AND PLAN:  Discussed the following assessment and plan:  Dysuria  -UA negative for infection. - Plan: POCT urinalysis dipstick  Loose stools -Discussed supportive care -Consider starting probiotic -Given history of IBS follow-up with GI advised -Discussed stress.  -Given precautions  Follow-up as needed   I discussed the assessment and treatment plan with the patient. The patient was provided an opportunity to ask questions and all were answered. The patient agreed with the plan and demonstrated an understanding of the instructions.   The patient was advised to call back or seek an in-person evaluation if the symptoms worsen or if the condition fails to improve as anticipated.  Billie Ruddy, MD

## 2019-12-21 ENCOUNTER — Encounter: Payer: Self-pay | Admitting: Sports Medicine

## 2019-12-21 ENCOUNTER — Ambulatory Visit (INDEPENDENT_AMBULATORY_CARE_PROVIDER_SITE_OTHER): Payer: Commercial Managed Care - PPO | Admitting: Sports Medicine

## 2019-12-21 ENCOUNTER — Other Ambulatory Visit: Payer: Self-pay

## 2019-12-21 DIAGNOSIS — M5416 Radiculopathy, lumbar region: Secondary | ICD-10-CM | POA: Diagnosis not present

## 2019-12-21 NOTE — Progress Notes (Signed)
    Procedures performed today:    None.  Independent interpretation of notes and tests performed by another provider:   None.  Brief History, Exam, Impression, and Recommendations:    Lumbar radiculopathy Laura [name], she is a pleasant 44 year old female, we have been treating [name] for chronic left hip pain, multifactorial, she has a diagnosed labral tear in [name] left hip, as well as trochanteric bursitis post multiple injections. At the last visit she was having left-sided L5 distribution radiculitis, we proceeded with a left L5-S1 transforaminal epidural and she reports essentially complete relief in [name] symptoms. She still has a bit of discomfort referable to [name] bursa as well as [name] labrum but feels as though this is tolerable with ibuprofen, return as needed.    ___________________________________________ Laura [name]. Dianah Field, M.D., ABFM., CAQSM. Primary Care and [place] Instructor of [place] of [facility] of Medicine

## 2019-12-21 NOTE — Assessment & Plan Note (Signed)
Gia returns, she is a pleasant 44 year old female, we have been treating her for chronic left hip pain, multifactorial, she has a diagnosed labral tear in her left hip, as well as trochanteric bursitis post multiple injections. At the last visit she was having left-sided L5 distribution radiculitis, we proceeded with a left L5-S1 transforaminal epidural and she reports essentially complete relief in her symptoms. She still has a bit of discomfort referable to her bursa as well as her labrum but feels as though this is tolerable with ibuprofen, return as needed.

## 2020-04-07 ENCOUNTER — Telehealth (INDEPENDENT_AMBULATORY_CARE_PROVIDER_SITE_OTHER): Payer: Commercial Managed Care - PPO | Admitting: Internal Medicine

## 2020-04-07 ENCOUNTER — Encounter: Payer: Self-pay | Admitting: Internal Medicine

## 2020-04-07 VITALS — Wt 180.0 lb

## 2020-04-07 DIAGNOSIS — J0100 Acute maxillary sinusitis, unspecified: Secondary | ICD-10-CM

## 2020-04-07 MED ORDER — AZITHROMYCIN 250 MG PO TABS: ORAL_TABLET | ORAL | 0 refills | Status: DC

## 2020-04-07 NOTE — Progress Notes (Signed)
Virtual Visit via Video Note  I connected with Laura [name] on 04/07/20 at  4:00 PM EDT by a video enabled telemedicine application and verified that I am speaking with the correct person using two identifiers.  Location patient: home Location provider: work office Persons participating in the virtual visit: patient, provider  I discussed the limitations of evaluation and management by telemedicine and the availability of in person appointments. The patient expressed understanding and agreed to proceed.   HPI: For the past 5 days she has been having a lot of maxillary sinus pressure, pain with significant frontal headaches and tooth pain.  She denies any fevers, she has had no sick contacts or recent travel.  She has had 2 Covid vaccines and flu vaccine.  She cannot take any over-the-counter medications because of significant intraocular pressures and her eye doctor has forbidden them.  She is typically treated with a Z-Pak and this is what she is requesting today.   ROS: Constitutional: Denies fever, chills, diaphoresis, appetite change and fatigue.  HEENT: Denies photophobia, eye pain, redness, hearing loss,mouth sores, trouble swallowing, neck pain, neck stiffness and tinnitus.   Respiratory: Denies SOB, DOE, cough, chest tightness,  and wheezing.   Cardiovascular: Denies chest pain, palpitations and leg swelling.  Gastrointestinal: Denies nausea, vomiting, abdominal pain, diarrhea, constipation, blood in stool and abdominal distention.  Genitourinary: Denies dysuria, urgency, frequency, hematuria, flank pain and difficulty urinating.  Endocrine: Denies: hot or cold intolerance, sweats, changes in hair or nails, polyuria, polydipsia. Musculoskeletal: Denies myalgias, back pain, joint swelling, arthralgias and gait problem.  Skin: Denies pallor, rash and wound.  Neurological: Denies dizziness, seizures, syncope, weakness, light-headedness, numbness and headaches.  Hematological:  Denies adenopathy. Easy bruising, personal or family bleeding history  Psychiatric/Behavioral: Denies suicidal ideation, mood changes, confusion, nervousness, sleep disturbance and agitation   Past Medical History:  Diagnosis Date  . Anemia   . Anxiety   . Depression   . GERD (gastroesophageal reflux disease)    sees Dr. Ludwig Lean at [place]   . Headache(784.0)   . Hypothyroidism    sees Dr. Malachy Mood in [place]   . IBS (irritable bowel syndrome)    sees Dr. Eusebio Friendly in [place]   . Insomnia   . Pituitary microadenoma with hyperprolactinemia [facility])    sees Dr. Mare Ferrari in [place]  . Restless legs   . Thyroid disease   . UTI (lower urinary tract infection)    sees Dr Karsten Ro    Past Surgical History:  Procedure Laterality Date  . ABDOMINAL HYSTERECTOMY  Nov 2008  . COLONOSCOPY  08-01-14   per Dr. Cindee Salt, tubular adenoma, repeat in 5 yrs   . ESOPHAGOGASTRODUODENOSCOPY  08-01-14   per Dr. Cindee Salt, gastritis only   . [place]   L side lymph node removal  . TONSILLECTOMY    . TUBAL LIGATION    . WISDOM TOOTH EXTRACTION      Family History  Problem Relation Age of Onset  . Alcohol abuse Father   . Hypertension Father   . Hypertension Mother   . Thyroid disease Mother        maternal grandmother  . Arrhythmia Mother   . Coronary artery disease Maternal Grandmother        paternal grandmother  . Hypertension Maternal Grandmother   . Diabetes Maternal Grandfather   . Hypertension Maternal Grandfather   . Aneurysm Paternal Grandfather        aortic  .  Brain cancer Brother   . Colon cancer Neg Hx   . Esophageal cancer Neg Hx   . Stomach cancer Neg Hx     SOCIAL HX:   reports that she has never smoked. She has never used smokeless tobacco. She reports that she does not drink alcohol and does not use drugs.   Current Outpatient Medications:  .  acetaminophen (TYLENOL) 650 MG CR tablet, Take 1 tablet (650 mg total) by mouth every 8  (eight) hours as needed for pain., Disp: 90 tablet, Rfl: 3 .  brimonidine (ALPHAGAN) 0.15 % ophthalmic solution, Place 1 drop into both eyes daily. , Disp: , Rfl: 11 .  buPROPion (WELLBUTRIN XL) 300 MG 24 hr tablet, TAKE 1 TABLET (300 MG TOTAL) BY MOUTH EVERY MORNING. PATIENT NEEDS AN OV FOR FURTHER REFILLS., Disp: 90 tablet, Rfl: 0 .  cabergoline (DOSTINEX) 0.5 MG tablet, Take 0.25 mg by mouth 2 (two) times a week. , Disp: , Rfl:  .  doxycycline (VIBRAMYCIN) 100 MG capsule, Take 100 mg by mouth daily., Disp: , Rfl:  .  ibuprofen (ADVIL,MOTRIN) 800 MG tablet, TAKE 1 TABLET BY MOUTH EVERY 8 HOURS AS NEEDED, Disp: 90 tablet, Rfl: 2 .  Levothyroxine Sodium 75 MCG CAPS, Take by mouth daily before breakfast. Takes 2 tablets on Mondays and Fridays, Disp: , Rfl: 0 .  lubiprostone (AMITIZA) 24 MCG capsule, Take 24 mcg by mouth 2 (two) times daily with a meal., Disp: , Rfl:  .  neomycin-polymyxin b-dexamethasone (MAXITROL) 3.5-10000-0.1 OINT, APPLY TO EYELID BOTH EYES TWICE A DAY FOR 2 WEEKS, Disp: 3.5 g, Rfl: 0 .  PAZEO 0.7 % SOLN, USE 1 DROP INTO BOTH EYES ONCE A DAY, Disp: , Rfl:  .  RESTASIS MULTIDOSE 0.05 % ophthalmic emulsion, , Disp: , Rfl:  .  azithromycin (ZITHROMAX) 250 MG tablet, Take as directed, Disp: 6 tablet, Rfl: 0  EXAM:   VITALS per patient if applicable: None reported  GENERAL: alert, oriented, appears well and in no acute distress, voice sounds stuffy  HEENT: atraumatic, conjunttiva clear, no obvious abnormalities on inspection of external nose and ears  NECK: normal movements of the head and neck  LUNGS: on inspection no signs of respiratory distress, breathing rate appears normal, no obvious gross increased work of breathing, gasping or wheezing  CV: no obvious cyanosis  MS: moves all visible extremities without noticeable abnormality  PSYCH/NEURO: pleasant and cooperative, no obvious depression or anxiety, speech and thought processing grossly intact  ASSESSMENT AND  PLAN:   Acute maxillary sinusitis, recurrence not specified  -Z-Pak has been prescribed today. -She may also take Tylenol/NSAIDs as needed for pain/pressure. -She is advised to follow-up with [place] in 10 to 14 days if no improvement.     I discussed the assessment and treatment plan with the patient. The patient was provided an opportunity to ask questions and all were answered. The patient agreed with the plan and demonstrated an understanding of the instructions.   The patient was advised to call back or seek an in-person evaluation if the symptoms worsen or if the condition fails to improve as anticipated.    Lelon Frohlich, MD  [facility]

## 2020-09-24 IMAGING — MR MR LUMBAR SPINE W/O CM
4 of 5 series · 26 of 48 positions shown · non-contrast
Comparison: 05/04/2018 lumbar spine radiographs. 04/29/2016 lumbar
spine MRI.

CLINICAL DATA: 41 y/o F; history of fall 4 years ago landing on the
back. Pain radiating to the left lower extremity in S1 distribution.
Six weeks of left foot numbness.

EXAM:
MRI LUMBAR SPINE WITHOUT CONTRAST
TECHNIQUE: Multiplanar, multisequence MR imaging of the lumbar spine was
performed. No intravenous contrast was administered.

[Series 2: T2 · sagittal · 4.0mm · 0.81mm/px · 6 of 15 slices shown (1 of 2)]
[im 1/15]
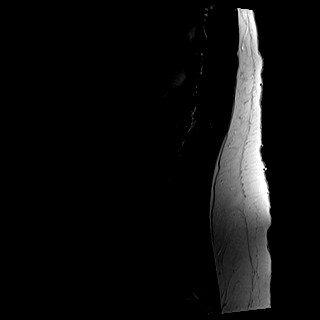
[im 3/15]
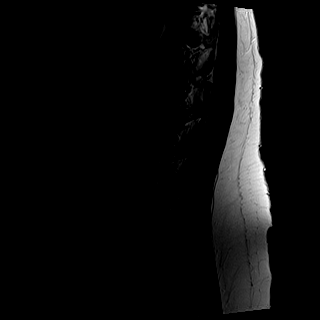
[im 6/15]
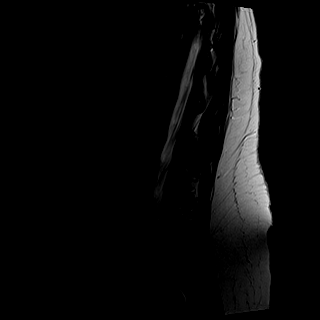
[im 9/15]
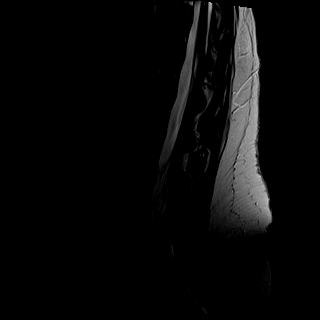
[im 12/15]
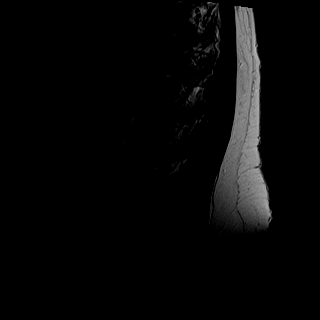
[im 15/15]
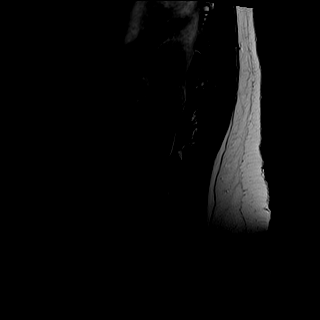

[Series 3: T1 · sagittal · 4.0mm · 0.41mm/px · 6 of 15 slices shown (1 of 2)]
[im 1/15]
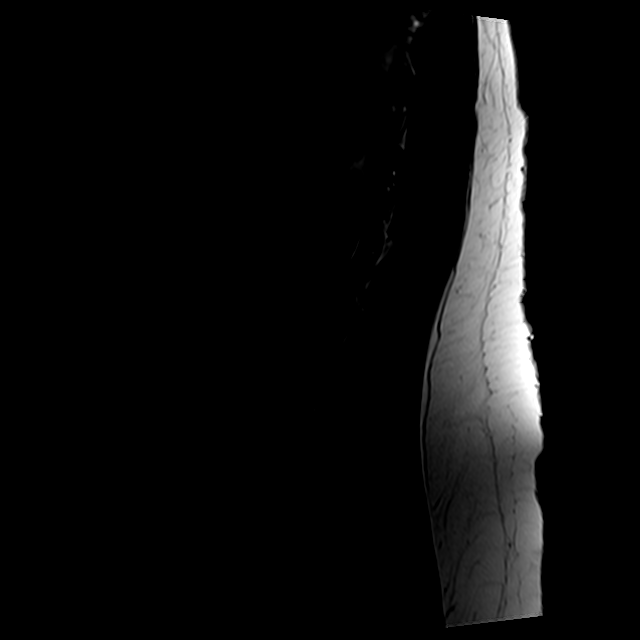
[im 3/15]
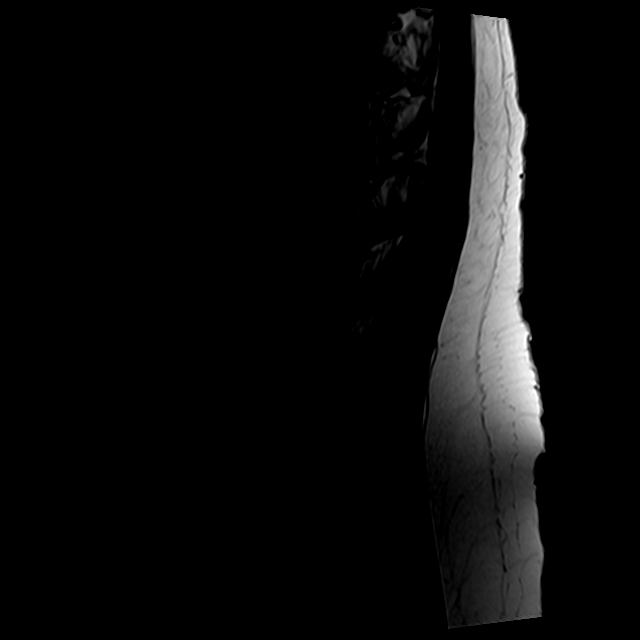
[im 6/15]
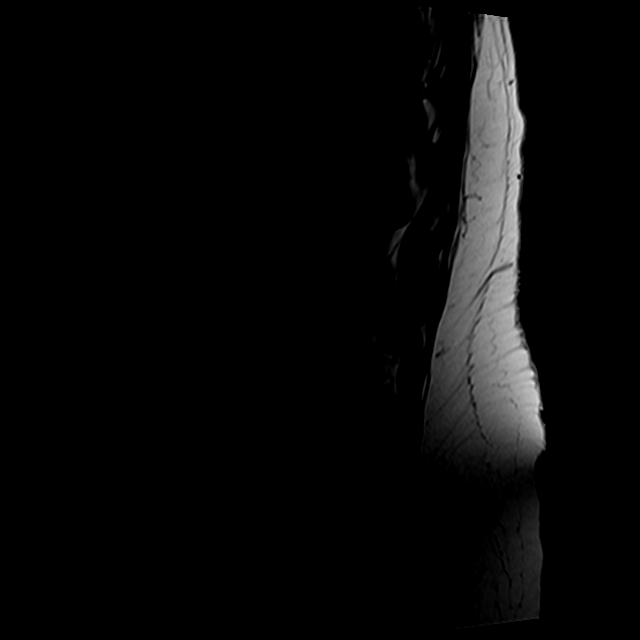
[im 9/15]
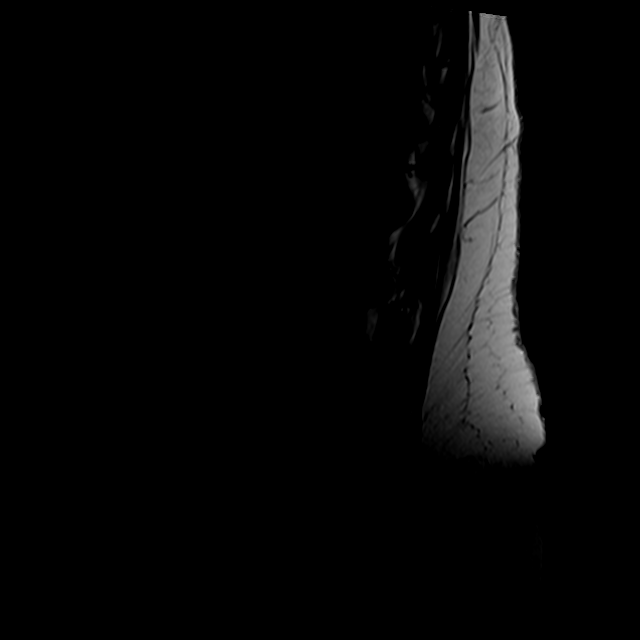
[im 12/15]
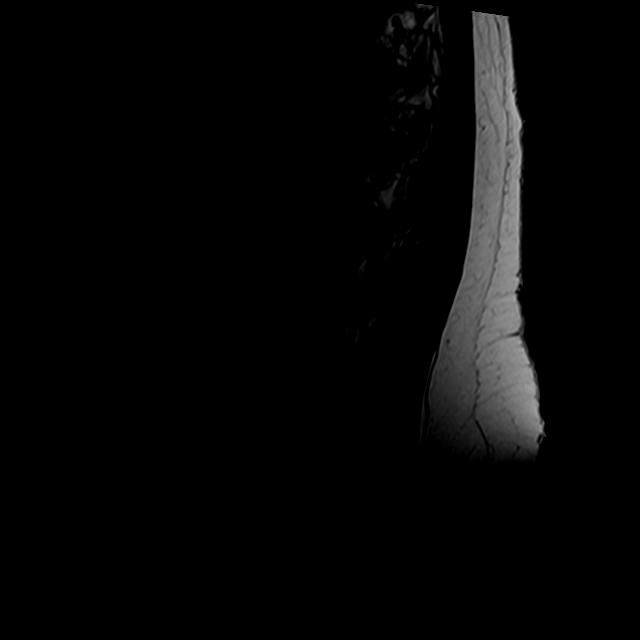
[im 15/15]
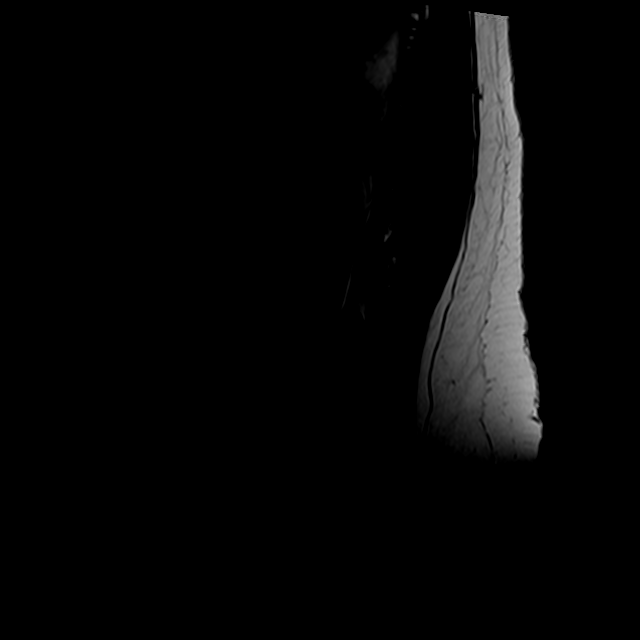

[Series 5: T2 · axial · 4.0mm · 0.78mm/px · z∈[-136,+78]mm · 9 of 41 slices shown (2 of 2)]
[im 1/41]
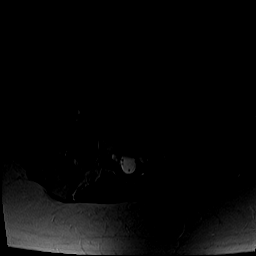
[im 6/41]
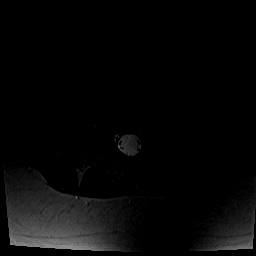
[im 12/41]
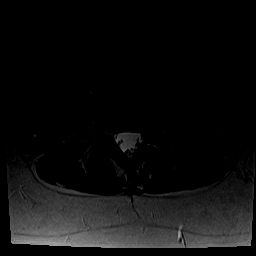
[im 18/41]
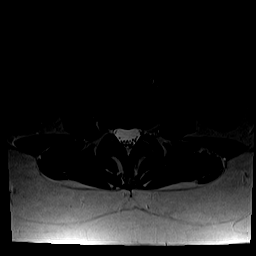
[im 21/41]
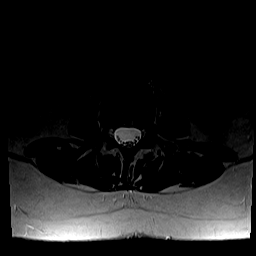
[im 23/41]
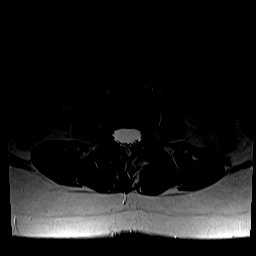
[im 29/41]
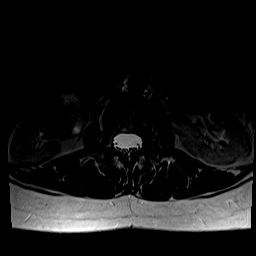
[im 35/41]
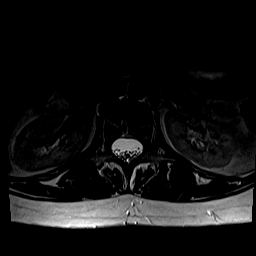
[im 41/41]
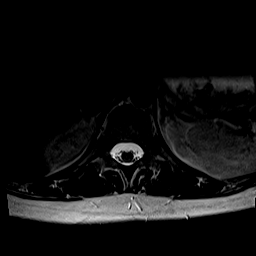

[Series 6: T1 · axial · 4.0mm · 0.39mm/px · z∈[-136,+48]mm · 5 of 41 slices shown (2 of 2)]
[im 1/41]
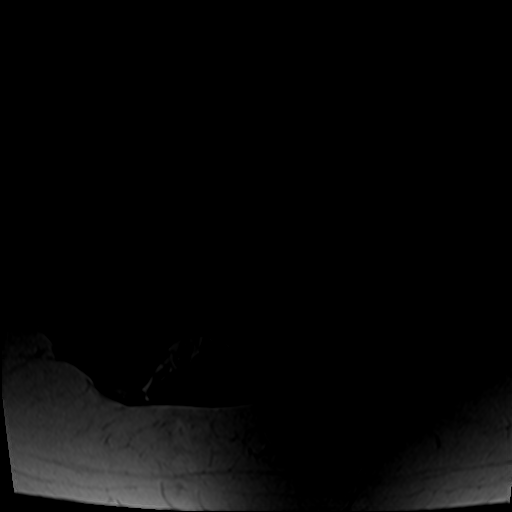
[im 6/41]
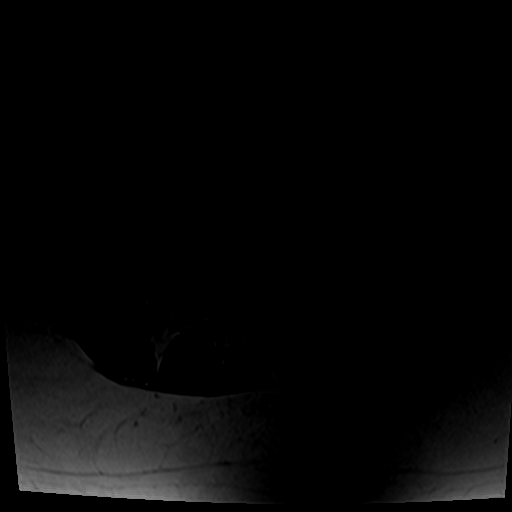
[im 12/41]
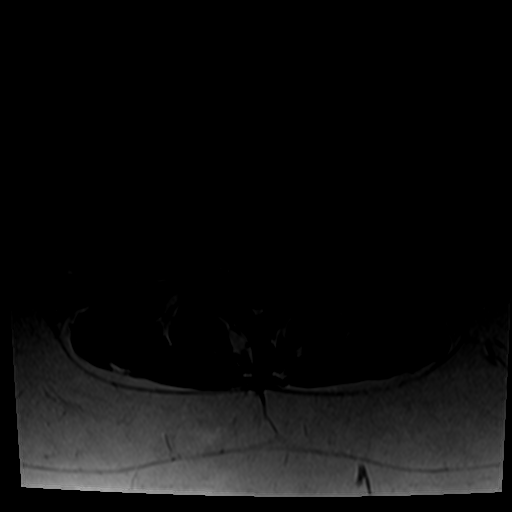
[im 21/41]
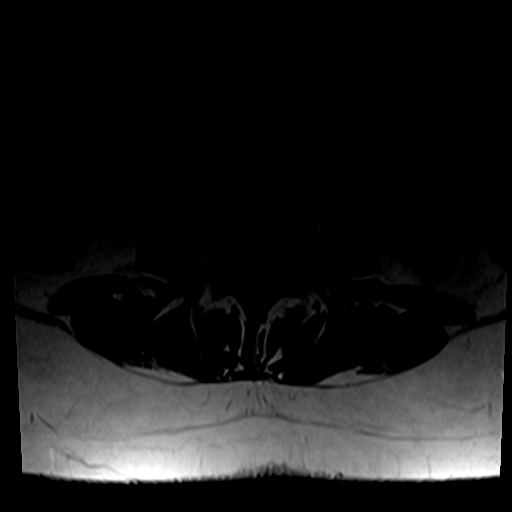
[im 35/41]
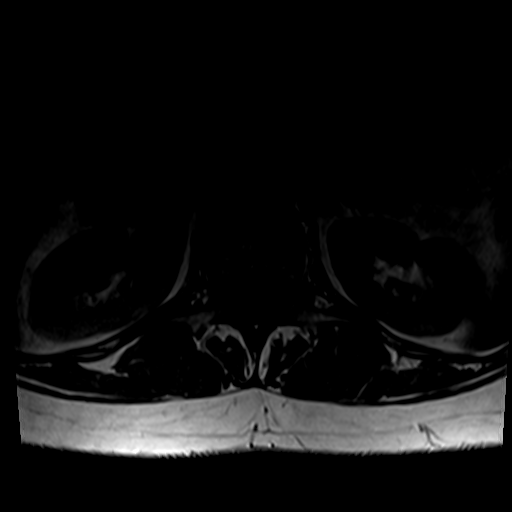

[26 of 48 positions shown; findings below may reference images not displayed]

FINDINGS: Segmentation:  Standard.

Alignment:  Physiologic.

Vertebrae:  No fracture, evidence of discitis, or bone lesion.

Conus medullaris and cauda equina: Conus extends to the L1 level.
Conus and cauda equina appear normal.

Paraspinal and other soft tissues: Negative.

Disc levels:

L1-2: No significant disc displacement, foraminal stenosis, or canal
stenosis.

L2-3: No significant disc displacement, foraminal stenosis, or canal
stenosis.

L3-4: Stable disc desiccation with mild loss of disc space height
and tiny central disc protrusion. No foraminal or canal stenosis.

L4-5: No significant disc displacement, foraminal stenosis, or canal
stenosis.

L5-S1: No significant disc displacement, foraminal stenosis, or
canal stenosis.
IMPRESSION: 1. No acute osseous abnormality or malalignment.
2. Stable mild L3-4 discogenic degenerative changes.
3. No foraminal stenosis, canal stenosis, or neural impingement
identified.

## 2020-10-18 LAB — HM MAMMOGRAPHY

## 2020-10-24 ENCOUNTER — Encounter: Payer: Self-pay | Admitting: Family Medicine

## 2020-10-31 ENCOUNTER — Other Ambulatory Visit: Payer: Self-pay

## 2020-10-31 ENCOUNTER — Ambulatory Visit: Payer: No Typology Code available for payment source | Admitting: Sports Medicine

## 2020-10-31 ENCOUNTER — Ambulatory Visit (INDEPENDENT_AMBULATORY_CARE_PROVIDER_SITE_OTHER): Payer: No Typology Code available for payment source

## 2020-10-31 DIAGNOSIS — R2232 Localized swelling, mass and lump, left upper limb: Secondary | ICD-10-CM | POA: Diagnosis not present

## 2020-10-31 NOTE — Progress Notes (Signed)
    Procedures performed today:    Procedure: Limited diagnostic ultrasound of left hand Device: Samsung HS60  Findings: Noted subcutaneous fullness, isoechoic with surrounding subcutaneous tissue. Images permanently stored in PACS. Impression: Scar, organized hematoma, or fat necrosis.  Independent interpretation of notes and tests performed by another provider:   None.  Brief History, Exam, Impression, and Recommendations:    Mass of hand, left This is a pleasant 45 year old female, she and her husband were moving a piece of furniture, her hypothenar eminence was pinched between 2 objects, this occurred about 2 months ago. Unfortunately she continues to have a subcutaneous mass that is highly tender. Neurovascularly intact distally. We did an unofficial ultrasound that showed a fullness that was isoechoic to surrounding subcutaneous tissues. My suspicion is this is likely scar, organized hematoma, or fat necrosis. I have advised her to massage this every day and if still present and painful after about 2 months I will do a surgical excision in the office.    ___________________________________________ Gwen Her. Dianah Field, M.D., ABFM., CAQSM. Primary Care and [place] Instructor of [place] of [facility] of Medicine

## 2020-10-31 NOTE — Assessment & Plan Note (Signed)
This is a pleasant 45 year old female, she and her husband were moving a piece of furniture, her hypothenar eminence was pinched between 2 objects, this occurred about 2 months ago. Unfortunately she continues to have a subcutaneous mass that is highly tender. Neurovascularly intact distally. We did an unofficial ultrasound that showed a fullness that was isoechoic to surrounding subcutaneous tissues. My suspicion is this is likely scar, organized hematoma, or fat necrosis. I have advised her to massage this every day and if still present and painful after about 2 months I will do a surgical excision in the office.

## 2020-11-14 ENCOUNTER — Other Ambulatory Visit: Payer: Self-pay

## 2020-11-14 ENCOUNTER — Ambulatory Visit: Payer: No Typology Code available for payment source | Admitting: Family Medicine

## 2020-11-14 ENCOUNTER — Encounter: Payer: Self-pay | Admitting: Family Medicine

## 2020-11-14 VITALS — BP 110/78 | HR 79 | Temp 98.1°F | Wt 201.0 lb

## 2020-11-14 DIAGNOSIS — R002 Palpitations: Secondary | ICD-10-CM

## 2020-11-14 MED ORDER — BUPROPION HCL ER (XL) 150 MG PO TB24: 150.0000 mg | ORAL_TABLET | Freq: Every day | ORAL | 3 refills | Status: DC

## 2020-11-14 NOTE — Progress Notes (Signed)
   Subjective:    Patient ID: Laura [name], female    DOB: 21-Feb-1976, 45 y.o.   MRN: [id number]  HPI Here for 3 weeks of intermittent fluttering in her chest. This occurs 3-4 times an hour. It lasts only a few seconds. No SOB or chest pain. She notices it only when she is sitting quietly or lying in bed. She sees her endocrinologist frequently to monitor her thyroid levels, etc. She only has one cup of coffee a day.   Review of Systems  Constitutional: Negative.   Respiratory: Negative.   Cardiovascular: Positive for palpitations. Negative for chest pain and leg swelling.       Objective:   Physical Exam Constitutional:      Appearance: Normal appearance.  Cardiovascular:     Rate and Rhythm: Normal rate and regular rhythm.     Pulses: Normal pulses.     Heart sounds: Normal heart sounds.  Pulmonary:     Effort: Pulmonary effort is normal.     Breath sounds: Normal breath sounds.  Neurological:     Mental Status: She is alert.           Assessment & Plan:  Palpitations, most likely benign. We will arrange for her to wear a cardiac monitor for several weeks. [name], MD

## 2020-11-17 ENCOUNTER — Ambulatory Visit: Payer: No Typology Code available for payment source | Admitting: Family Medicine

## 2020-11-24 ENCOUNTER — Ambulatory Visit (INDEPENDENT_AMBULATORY_CARE_PROVIDER_SITE_OTHER): Payer: No Typology Code available for payment source

## 2020-11-24 DIAGNOSIS — R002 Palpitations: Secondary | ICD-10-CM

## 2020-12-20 ENCOUNTER — Other Ambulatory Visit: Payer: Self-pay

## 2020-12-21 ENCOUNTER — Ambulatory Visit (INDEPENDENT_AMBULATORY_CARE_PROVIDER_SITE_OTHER): Payer: No Typology Code available for payment source | Admitting: Family Medicine

## 2020-12-21 ENCOUNTER — Encounter: Payer: Self-pay | Admitting: Family Medicine

## 2020-12-21 VITALS — BP 98/72 | HR 60 | Temp 98.1°F | Ht 67.0 in | Wt 200.0 lb

## 2020-12-21 DIAGNOSIS — Z0000 Encounter for general adult medical examination without abnormal findings: Secondary | ICD-10-CM | POA: Diagnosis not present

## 2020-12-21 DIAGNOSIS — D709 Neutropenia, unspecified: Secondary | ICD-10-CM | POA: Diagnosis not present

## 2020-12-21 LAB — VITAMIN B12: Vitamin B-12: 427 pg/mL (ref 211–911)

## 2020-12-21 LAB — LIPID PANEL
Cholesterol: 221 mg/dL — ABNORMAL HIGH (ref 0–200)
HDL: 72.8 mg/dL (ref >39.00)
LDL Cholesterol: 132 mg/dL — ABNORMAL HIGH (ref 0–99)
NonHDL: 147.87
Total CHOL/HDL Ratio: 3
Triglycerides: 81 mg/dL (ref 0.0–149.0)
VLDL: 16.2 mg/dL (ref 0.0–40.0)

## 2020-12-21 LAB — CBC WITH DIFFERENTIAL/PLATELET
Basophils Absolute: 0 10*3/uL (ref 0.0–0.1)
Basophils Relative: 0.2 % (ref 0.0–3.0)
Eosinophils Absolute: 0.1 10*3/uL (ref 0.0–0.7)
Eosinophils Relative: 2 % (ref 0.0–5.0)
HCT: 39.9 % (ref 36.0–46.0)
Hemoglobin: 13.2 g/dL (ref 12.0–15.0)
Lymphocytes Relative: 39.6 % (ref 12.0–46.0)
Lymphs Abs: 1.1 10*3/uL (ref 0.7–4.0)
MCHC: 33 g/dL (ref 30.0–36.0)
MCV: 91.1 fl (ref 78.0–100.0)
Monocytes Absolute: 0.3 10*3/uL (ref 0.1–1.0)
Monocytes Relative: 10.1 % (ref 3.0–12.0)
Neutro Abs: 1.3 10*3/uL — ABNORMAL LOW (ref 1.4–7.7)
Neutrophils Relative %: 48.1 % (ref 43.0–77.0)
Platelets: 194 10*3/uL (ref 150.0–400.0)
RBC: 4.38 Mil/uL (ref 3.87–5.11)
RDW: 13.3 % (ref 11.5–15.5)
WBC: 2.7 10*3/uL — ABNORMAL LOW (ref 4.0–10.5)

## 2020-12-21 LAB — BASIC METABOLIC PANEL
BUN: 13 mg/dL (ref 6–23)
CO2: 31 mEq/L (ref 19–32)
Calcium: 9.6 mg/dL (ref 8.4–10.5)
Chloride: 104 mEq/L (ref 96–112)
Creatinine, Ser: 0.95 mg/dL (ref 0.40–1.20)
GFR: 72.31 mL/min (ref >60.00)
Glucose, Bld: 80 mg/dL (ref 70–99)
Potassium: 5 mEq/L (ref 3.5–5.1)
Sodium: 140 mEq/L (ref 135–145)

## 2020-12-21 LAB — HEPATIC FUNCTION PANEL
ALT: 16 U/L (ref 0–35)
AST: 16 U/L (ref 0–37)
Albumin: 4.4 g/dL (ref 3.5–5.2)
Alkaline Phosphatase: 69 U/L (ref 39–117)
Bilirubin, Direct: 0.1 mg/dL (ref 0.0–0.3)
Total Bilirubin: 0.4 mg/dL (ref 0.2–1.2)
Total Protein: 6.8 g/dL (ref 6.0–8.3)

## 2020-12-21 LAB — VITAMIN D 25 HYDROXY (VIT D DEFICIENCY, FRACTURES): VITD: 34.29 ng/mL (ref 30.00–100.00)

## 2020-12-21 LAB — HEMOGLOBIN A1C: Hgb A1c MFr Bld: 5.3 % (ref 4.6–6.5)

## 2020-12-21 MED ORDER — CITALOPRAM HYDROBROMIDE 20 MG PO TABS: 20.0000 mg | ORAL_TABLET | Freq: Every day | ORAL | 3 refills | Status: DC

## 2020-12-21 NOTE — Progress Notes (Signed)
   Subjective:    Patient ID: Laura [name], female    DOB: 1975-12-10, 45 y.o.   MRN: [id number]  HPI Here for a well exam. Her main concern is constant tiredness. She sleeps well and she exercises. She used to take B12 shots but she stopped several years ago. She has been on Wellbutrin XL 150 mg daily and Celexa 10 mg daily, but she thinks her anxiety levels are higher lately.    Review of Systems  Constitutional:  Positive for fatigue.  HENT: Negative.    Eyes: Negative.   Respiratory: Negative.    Cardiovascular: Negative.   Gastrointestinal: Negative.   Genitourinary:  Negative for decreased urine volume, difficulty urinating, dyspareunia, dysuria, enuresis, flank pain, frequency, hematuria, pelvic pain and urgency.  Musculoskeletal: Negative.   Skin: Negative.   Neurological: Negative.  Negative for headaches.  Psychiatric/Behavioral:  The patient is nervous/anxious.       Objective:   Physical Exam Constitutional:      General: She is not in acute distress.    Appearance: Normal appearance. She is well-developed.  HENT:     Head: Normocephalic and atraumatic.     Right Ear: External ear normal.     Left Ear: External ear normal.     Nose: Nose normal.     Mouth/Throat:     Pharynx: No oropharyngeal exudate.  Eyes:     General: No scleral icterus.    Conjunctiva/sclera: Conjunctivae normal.     Pupils: Pupils are equal, round, and reactive to light.  Neck:     Thyroid: No thyromegaly.     Vascular: No JVD.  Cardiovascular:     Rate and Rhythm: Normal rate and regular rhythm.     Heart sounds: Normal heart sounds. No murmur heard.   No friction rub. No gallop.  Pulmonary:     Effort: Pulmonary effort is normal. No respiratory distress.     Breath sounds: Normal breath sounds. No wheezing or rales.  Chest:     Chest wall: No tenderness.  Abdominal:     General: Bowel sounds are normal. There is no distension.     Palpations: Abdomen is soft. There is no mass.      Tenderness: There is no abdominal tenderness. There is no guarding or rebound.  Musculoskeletal:        General: No tenderness. Normal range of motion.     Cervical back: Normal range of motion and neck supple.  Lymphadenopathy:     Cervical: No cervical adenopathy.  Skin:    General: Skin is warm and dry.     Findings: No erythema or rash.  Neurological:     Mental Status: She is alert and oriented to person, place, and time.     Cranial Nerves: No cranial nerve deficit.     Motor: No abnormal muscle tone.     Coordination: Coordination normal.     Deep Tendon Reflexes: Reflexes are normal and symmetric. Reflexes normal.  Psychiatric:        Behavior: Behavior normal.        Thought Content: Thought content normal.        Judgment: Judgment normal.          Assessment & Plan:  Well exam. We discussed diet and exercise. Get fasting labs including vitamins B12 and D. We will increase the Celexa to 20 mg daily.  [name], MD

## 2020-12-22 NOTE — Addendum Note (Signed)
Addended by: [name] A on: 12/22/2020 12:53 PM   Modules accepted: Orders

## 2020-12-26 ENCOUNTER — Telehealth: Payer: Self-pay | Admitting: *Deleted

## 2020-12-26 NOTE — Telephone Encounter (Signed)
Spoke with the pt and informed her Dr Sarajane Jews completed the health screening form for [facility] and this was left at the front desk for pick up.  Copy sent to be scanned.

## 2020-12-26 NOTE — Addendum Note (Signed)
Addended by: [name] A on: 12/26/2020 04:30 PM   Modules accepted: Orders

## 2021-01-10 ENCOUNTER — Telehealth: Payer: Self-pay | Admitting: Family Medicine

## 2021-01-10 DIAGNOSIS — D709 Neutropenia, unspecified: Secondary | ICD-10-CM

## 2021-01-10 NOTE — Telephone Encounter (Signed)
Done

## 2021-01-29 ENCOUNTER — Encounter: Payer: Self-pay | Admitting: Family Medicine

## 2021-03-06 ENCOUNTER — Ambulatory Visit (INDEPENDENT_AMBULATORY_CARE_PROVIDER_SITE_OTHER): Payer: No Typology Code available for payment source

## 2021-03-06 ENCOUNTER — Other Ambulatory Visit: Payer: Self-pay

## 2021-03-06 ENCOUNTER — Ambulatory Visit: Payer: No Typology Code available for payment source | Admitting: Sports Medicine

## 2021-03-06 DIAGNOSIS — M25511 Pain in right shoulder: Secondary | ICD-10-CM

## 2021-03-06 DIAGNOSIS — G8929 Other chronic pain: Secondary | ICD-10-CM

## 2021-03-06 NOTE — Assessment & Plan Note (Signed)
History of subacromial bursitis, increasing pain today this time over the acromioclavicular joint on the right with a positive crossarm sign and tenderness over the acromioclavicular joint itself. Over-the-counter conservative treatments not working, we injected her acromioclavicular joint today, getting updated x-rays. Return to see me in 1 month.

## 2021-03-06 NOTE — Progress Notes (Signed)
    Procedures performed today:    Procedure: Real-time Ultrasound Guided injection of the right acromioclavicular joint Device: Samsung HS60  Verbal informed consent obtained.  Time-out conducted.  Noted no overlying erythema, induration, or other signs of local infection.  Skin prepped in a sterile fashion.  Local anesthesia: Topical Ethyl chloride.  With sterile technique and under real time ultrasound guidance: Noted arthritic joint, 1/2 cc lidocaine, 1/2 cc kenalog 40 injected easily.   Completed without difficulty  Advised to call if fevers/chills, erythema, induration, drainage, or persistent bleeding.  Images permanently stored and available for review in PACS.  Impression: Technically successful ultrasound guided injection.  Independent interpretation of notes and tests performed by another provider:   None.  Brief History, Exam, Impression, and Recommendations:    Right shoulder pain History of subacromial bursitis, increasing pain today this time over the acromioclavicular joint on the right with a positive crossarm sign and tenderness over the acromioclavicular joint itself. Over-the-counter conservative treatments not working, we injected her acromioclavicular joint today, getting updated x-rays. Return to see me in 1 month.    ___________________________________________ Gwen Her. Dianah Field, M.D., ABFM., CAQSM. Primary Care and [place] Instructor of [place] [employer] [facility]

## 2021-03-19 ENCOUNTER — Encounter: Payer: Self-pay | Admitting: Family Medicine

## 2021-04-17 ENCOUNTER — Ambulatory Visit: Payer: No Typology Code available for payment source | Admitting: Sports Medicine

## 2021-04-24 ENCOUNTER — Other Ambulatory Visit: Payer: Self-pay | Admitting: Family Medicine

## 2021-05-05 ENCOUNTER — Encounter: Payer: Self-pay | Admitting: Family Medicine

## 2021-05-07 MED ORDER — CITALOPRAM HYDROBROMIDE 20 MG PO TABS: 20.0000 mg | ORAL_TABLET | Freq: Every day | ORAL | 0 refills | Status: DC

## 2021-07-09 ENCOUNTER — Other Ambulatory Visit: Payer: Self-pay | Admitting: Family Medicine

## 2021-07-24 ENCOUNTER — Encounter: Payer: Self-pay | Admitting: Family Medicine

## 2021-09-17 ENCOUNTER — Encounter: Payer: Self-pay | Admitting: Family Medicine

## 2021-09-17 ENCOUNTER — Ambulatory Visit: Payer: No Typology Code available for payment source | Admitting: Family Medicine

## 2021-09-17 VITALS — BP 94/72 | HR 87 | Temp 98.9°F | Ht 67.0 in | Wt 195.8 lb

## 2021-09-17 DIAGNOSIS — R5382 Chronic fatigue, unspecified: Secondary | ICD-10-CM | POA: Diagnosis not present

## 2021-09-17 DIAGNOSIS — E559 Vitamin D deficiency, unspecified: Secondary | ICD-10-CM

## 2021-09-17 DIAGNOSIS — E538 Deficiency of other specified B group vitamins: Secondary | ICD-10-CM | POA: Diagnosis not present

## 2021-09-17 LAB — VITAMIN B12: Vitamin B-12: 270 pg/mL (ref 211–911)

## 2021-09-17 LAB — VITAMIN D 25 HYDROXY (VIT D DEFICIENCY, FRACTURES): VITD: 33.66 ng/mL (ref 30.00–100.00)

## 2021-09-17 NOTE — Progress Notes (Signed)
? ?  Subjective:  ? ? Patient ID: Laura [name], female    DOB: 1976/04/11, 46 y.o.   MRN: [id number] ? ?HPI ?Here complaining of constant fatigue and daytime sleepiness. This has been an issue for the past year, but it seems to be getting worse. She says she sleeps well at night and she does not snore. She dreams a lot so she thinks she gets restful sleep. She often fights the urge to fall asleep when on her computer at work or when reading a book or watching TV. She has a hx of vitamin B12 and D deficiencies. She takes 500 units of vitamin D daily but she takes no B12 supplements. She sees her endocrinologist regularly for her hypothyroidism, and her levels are always within range.  ? ? ?Review of Systems  ?Constitutional:  Positive for fatigue.  ?Respiratory: Negative.    ?Cardiovascular: Negative.   ?Gastrointestinal: Negative.   ?Endocrine: Negative.   ?Genitourinary: Negative.   ? ?   ?Objective:  ? Physical Exam ?Constitutional:   ?   Appearance: Normal appearance.  ?Cardiovascular:  ?   Rate and Rhythm: Normal rate and regular rhythm.  ?   Pulses: Normal pulses.  ?   Heart sounds: Normal heart sounds.  ?Pulmonary:  ?   Effort: Pulmonary effort is normal.  ?   Breath sounds: Normal breath sounds.  ?Neurological:  ?   General: No focal deficit present.  ?   Mental Status: She is alert and oriented to person, place, and time.  ? ? ? ? ? ?   ?Assessment & Plan:  ?Chronic fatigue and sleepiness. We will check levels for vitamins D and B12 today. If these are normal, we may refer her for a sleep evaluation. ?[name], MD ? ? ?

## 2021-10-12 ENCOUNTER — Other Ambulatory Visit: Payer: Self-pay | Admitting: Family Medicine

## 2021-10-12 DIAGNOSIS — F418 Other specified anxiety disorders: Secondary | ICD-10-CM

## 2021-10-26 ENCOUNTER — Other Ambulatory Visit: Payer: Self-pay | Admitting: Family Medicine

## 2021-11-07 ENCOUNTER — Encounter: Payer: Self-pay | Admitting: Family Medicine

## 2021-11-07 ENCOUNTER — Ambulatory Visit: Payer: No Typology Code available for payment source | Admitting: Family Medicine

## 2021-11-07 VITALS — BP 98/70 | HR 78 | Temp 99.0°F | Wt 198.0 lb

## 2021-11-07 DIAGNOSIS — J3089 Other allergic rhinitis: Secondary | ICD-10-CM | POA: Insufficient documentation

## 2021-11-07 MED ORDER — METHYLPREDNISOLONE ACETATE 40 MG/ML IJ SUSP
40.0000 mg | Freq: Once | INTRAMUSCULAR | Status: AC
  Administered 2021-11-07: 40 mg via INTRAMUSCULAR

## 2021-11-07 MED ORDER — METHYLPREDNISOLONE ACETATE 80 MG/ML IJ SUSP
80.0000 mg | Freq: Once | INTRAMUSCULAR | Status: AC
  Administered 2021-11-07: 80 mg via INTRAMUSCULAR

## 2021-11-07 NOTE — Progress Notes (Signed)
   Subjective:    Patient ID: Laura [name], female    DOB: [birth year], 46 y.o.   MRN: [id number]  HPI Here for 2 weeks of intermittent pressure and mild pain in both ears. Her hearing is muffled and she feels like "fluid is swishing" in the ears. She also has her typical springtime itchy eyes, runny nose, and sneezing. She does not tolerate any decongestants or antihistamines.    Review of Systems  Constitutional: Negative.   HENT:  Positive for ear pain, hearing loss, postnasal drip and sneezing. Negative for sore throat.   Eyes:  Positive for itching. Negative for discharge.  Respiratory: Negative.        Objective:   Physical Exam Constitutional:      Appearance: Normal appearance. She is not ill-appearing.  HENT:     Right Ear: Tympanic membrane, ear canal and external ear normal.     Left Ear: Tympanic membrane, ear canal and external ear normal.     Nose: Nose normal.     Mouth/Throat:     Pharynx: Oropharynx is clear.  Eyes:     Conjunctiva/sclera: Conjunctivae normal.  Pulmonary:     Effort: Pulmonary effort is normal.     Breath sounds: Normal breath sounds.  Neurological:     Mental Status: She is alert.          Assessment & Plan:  Her allergies are causing some eustachian tube dysfunction. We will give her a shot of DepoMedrol. I also suggested she use Flonase nasal sprays until the pollen season is over.  [name], MD

## 2021-11-07 NOTE — Addendum Note (Signed)
Addended by: Wyvonne Lenz on: 11/07/2021 10:05 AM   Modules accepted: Orders

## 2021-12-26 ENCOUNTER — Encounter: Payer: Self-pay | Admitting: Family Medicine

## 2021-12-26 ENCOUNTER — Telehealth (INDEPENDENT_AMBULATORY_CARE_PROVIDER_SITE_OTHER): Payer: No Typology Code available for payment source | Admitting: Family Medicine

## 2021-12-26 DIAGNOSIS — B029 Zoster without complications: Secondary | ICD-10-CM

## 2021-12-26 MED ORDER — VALACYCLOVIR HCL 1 G PO TABS: 1000.0000 mg | ORAL_TABLET | Freq: Three times a day (TID) | ORAL | 0 refills | Status: AC

## 2021-12-26 MED ORDER — VALACYCLOVIR HCL 1 G PO TABS: 1000.0000 mg | ORAL_TABLET | Freq: Every day | ORAL | 3 refills | Status: AC

## 2021-12-26 NOTE — Progress Notes (Signed)
Subjective:    Patient ID: Laura [name], female    DOB: March 10, 1976, 46 y.o.   MRN: [id number]  HPI Virtual Visit via Video Note  I connected with the patient on 12/26/21 at  2:00 PM EDT by a video enabled telemedicine application and verified that I am speaking with the correct person using two identifiers.  Location patient: home Location provider:work or home office Persons participating in the virtual visit: patient, provider  I discussed the limitations of evaluation and management by telemedicine and the availability of in person appointments. The patient expressed understanding and agreed to proceed.   HPI: Here for what she thinks is another bout of shingles. About 5 days ago an area on her left side began to itch. Then yesterday this changed to a burning sensation, and this morning a single red bump appeared. This is exactly the way her previous bouts of shingles began. She feels fine otherwise.    ROS: See pertinent positives and negatives per HPI.  Past Medical History:  Diagnosis Date   Anemia    Anxiety    Depression    GERD (gastroesophageal reflux disease)    sees Dr. Ludwig Lean at [place]    se[facility] Dr. Jodi Mourning in [facility])    Hypothyroidism    sees Dr. Malachy Mood in [place]    IBS (irritable bowel syndrome)    sees Dr. Eusebio Friendly in [place]    Insomnia    Pituitary microadenoma with hyperprolactinemia [facility])    sees Dr. Mare Ferrari in [facility]   Restless legs    Thyroid disease    UTI (lower urinary tract infection)    sees Dr Karsten Ro    Past Surgical History:  Procedure Laterality Date   ABDOMINAL HYSTERECTOMY  Nov 2008   COLONOSCOPY  08-01-14   per Dr. Cindee Salt, tubular adenoma, repeat in 5 yrs    ESOPHAGOGASTRODUODENOSCOPY  08-01-14   per Dr. Cindee Salt, gastritis only    [place]   L side lymph node removal   TONSILLECTOMY     TUBAL LIGATION     WISDOM TOOTH EXTRACTION      Family  History  Problem Relation Age of Onset   Alcohol abuse Father    Hypertension Father    Hypertension Mother    Thyroid disease Mother        maternal grandmother   Arrhythmia Mother    Coronary artery disease Maternal Grandmother        paternal grandmother   Hypertension Maternal Grandmother    Diabetes Maternal Grandfather    Hypertension Maternal Grandfather    Aneurysm Paternal Grandfather        aortic   Brain cancer Brother    Colon cancer Neg Hx    Esophageal cancer Neg Hx    Stomach cancer Neg Hx      Current Outpatient Medications:    valACYclovir (VALTREX) 1000 MG tablet, Take 1 tablet (1,000 mg total) by mouth 3 (three) times daily for 10 days., Disp: 30 tablet, Rfl: 0   valACYclovir (VALTREX) 1000 MG tablet, Take 1 tablet (1,000 mg total) by mouth daily., Disp: 90 tablet, Rfl: 3   buPROPion (WELLBUTRIN XL) 150 MG 24 hr tablet, TAKE 1 TABLET BY MOUTH EVERY DAY, Disp: 90 tablet, Rfl: 0   cabergoline (DOSTINEX) 0.5 MG tablet, Take 0.25 mg by mouth 2 (two) times a week. , Disp: , Rfl:    citalopram (CELEXA) 20  MG tablet, TAKE 1 TABLET BY MOUTH EVERY DAY, Disp: 90 tablet, Rfl: 0   doxycycline (VIBRAMYCIN) 100 MG capsule, Take 100 mg by mouth daily., Disp: , Rfl:    Levothyroxine Sodium 75 MCG CAPS, Take by mouth daily before breakfast. Takes 2 tablets on Mondays and Fridays, Disp: , Rfl: 0   loteprednol (LOTEMAX) 0.5 % ophthalmic suspension, 1 drop 2 (two) times daily., Disp: , Rfl:    neomycin-polymyxin b-dexamethasone (MAXITROL) 3.5-10000-0.1 OINT, APPLY TO EYELID BOTH EYES TWICE A DAY FOR 2 WEEKS, Disp: 3.5 g, Rfl: 0   PAZEO 0.7 % SOLN, USE 1 DROP INTO BOTH EYES ONCE A DAY, Disp: , Rfl:    RESTASIS MULTIDOSE 0.05 % ophthalmic emulsion, , Disp: , Rfl:   EXAM:  VITALS per patient if applicable:  GENERAL: alert, oriented, appears well and in no acute distress  HEENT: atraumatic, conjunttiva clear, no obvious abnormalities on inspection of external nose and  ears  NECK: normal movements of the head and neck  LUNGS: on inspection no signs of respiratory distress, breathing rate appears normal, no obvious gross SOB, gasping or wheezing  CV: no obvious cyanosis  MS: moves all visible extremities without noticeable abnormality  PSYCH/NEURO: pleasant and cooperative, no obvious depression or anxiety, speech and thought processing grossly intact  ASSESSMENT AND PLAN: Shingles. We will treat the acute bout with 10 days of Valtrex 1000 mg TID. This is the third bout she has had on the past 18 months. She does not yet qualify for the shingles vaccine, so we agreed she would start on maintenance therapy. Once this resolves she will start taking 1000 mg of Valtrex once every day. [name], MD  Discussed the following assessment and plan:  No diagnosis found.     I discussed the assessment and treatment plan with the patient. The patient was provided an opportunity to ask questions and all were answered. The patient agreed with the plan and demonstrated an understanding of the instructions.   The patient was advised to call back or seek an in-person evaluation if the symptoms worsen or if the condition fails to improve as anticipated.      Review of Systems     Objective:   Physical Exam        Assessment & Plan:

## 2022-01-12 ENCOUNTER — Other Ambulatory Visit: Payer: Self-pay | Admitting: Family Medicine

## 2022-01-12 DIAGNOSIS — F418 Other specified anxiety disorders: Secondary | ICD-10-CM

## 2022-01-28 ENCOUNTER — Telehealth (INDEPENDENT_AMBULATORY_CARE_PROVIDER_SITE_OTHER): Payer: No Typology Code available for payment source | Admitting: Family Medicine

## 2022-01-28 ENCOUNTER — Other Ambulatory Visit: Payer: Self-pay | Admitting: Family Medicine

## 2022-01-28 ENCOUNTER — Encounter: Payer: Self-pay | Admitting: Family Medicine

## 2022-01-28 DIAGNOSIS — B359 Dermatophytosis, unspecified: Secondary | ICD-10-CM | POA: Diagnosis not present

## 2022-01-28 MED ORDER — KETOCONAZOLE 200 MG PO TABS: 200.0000 mg | ORAL_TABLET | Freq: Every day | ORAL | 1 refills | Status: DC

## 2022-01-28 NOTE — Progress Notes (Signed)
Subjective:    Patient ID: Laura [name], female    DOB: 1976/03/20, 46 y.o.   MRN: [id number]  HPI Virtual Visit via Video Note  I connected with the patient on 01/28/22 at  1:30 PM EDT by a video enabled telemedicine application and verified that I am speaking with the correct person using two identifiers.  Location patient: home Location provider:work or home office Persons participating in the virtual visit: patient, provider  I discussed the limitations of evaluation and management by telemedicine and the availability of in person appointments. The patient expressed understanding and agreed to proceed.   HPI: Here asking for oral medication for ringworm. She has 3 cats, and 2 of them were recently diagnosed as having ringworm. They are being treated orally and topically. Then last week she saw a red spot on her chin and went to her dermatologist, who confirmed this was ringworm. She gave her a topical cream to use. Then Laura [name] heard that the entire household should be treated prophylactically .   ROS: See pertinent positives and negatives per HPI.  Past Medical History:  Diagnosis Date   Anemia    Anxiety    Depression    GERD (gastroesophageal reflux disease)    sees Dr. Ludwig Lean at [place]    sees Dr. Jodi Mourning in [facility])    Hypothyroidism    sees Dr. Malachy Mood in [place]    IBS (irritable bowel syndrome)    sees Dr. Eusebio Friendly in [place]    Insomnia    Pituitary microadenoma with hyperprolactinemia [facility])    sees Dr. Mare Ferrari in [facility]   Restless legs    Thyroid disease    UTI (lower urinary tract infection)    sees Dr Karsten Ro    Past Surgical History:  Procedure Laterality Date   ABDOMINAL HYSTERECTOMY  Nov 2008   COLONOSCOPY  08-01-14   per Dr. Cindee Salt, tubular adenoma, repeat in 5 yrs    ESOPHAGOGASTRODUODENOSCOPY  08-01-14   per Dr. Cindee Salt, gastritis only    [place]   L side lymph  node removal   TONSILLECTOMY     TUBAL LIGATION     WISDOM TOOTH EXTRACTION      Family History  Problem Relation Age of Onset   Alcohol abuse Father    Hypertension Father    Hypertension Mother    Thyroid disease Mother        maternal grandmother   Arrhythmia Mother    Coronary artery disease Maternal Grandmother        paternal grandmother   Hypertension Maternal Grandmother    Diabetes Maternal Grandfather    Hypertension Maternal Grandfather    Aneurysm Paternal Grandfather        aortic   Brain cancer Brother    Colon cancer Neg Hx    Esophageal cancer Neg Hx    Stomach cancer Neg Hx      Current Outpatient Medications:    buPROPion (WELLBUTRIN XL) 150 MG 24 hr tablet, TAKE 1 TABLET BY MOUTH EVERY DAY, Disp: 90 tablet, Rfl: 0   cabergoline (DOSTINEX) 0.5 MG tablet, Take 0.25 mg by mouth 2 (two) times a week. , Disp: , Rfl:    citalopram (CELEXA) 20 MG tablet, TAKE 1 TABLET BY MOUTH EVERY DAY, Disp: 90 tablet, Rfl: 0   doxycycline (VIBRAMYCIN) 100 MG capsule, Take 100 mg by mouth daily. Takes for Acne, Disp: , Rfl:  Levothyroxine Sodium 75 MCG CAPS, Take by mouth daily before breakfast. Takes 2 tablets on Mondays and Fridays, Disp: , Rfl: 0   loteprednol (LOTEMAX) 0.5 % ophthalmic suspension, 1 drop 2 (two) times daily., Disp: , Rfl:    neomycin-polymyxin b-dexamethasone (MAXITROL) 3.5-10000-0.1 OINT, APPLY TO EYELID BOTH EYES TWICE A DAY FOR 2 WEEKS, Disp: 3.5 g, Rfl: 0   PAZEO 0.7 % SOLN, USE 1 DROP INTO BOTH EYES ONCE A DAY, Disp: , Rfl:    RESTASIS MULTIDOSE 0.05 % ophthalmic emulsion, , Disp: , Rfl:    valACYclovir (VALTREX) 1000 MG tablet, Take 1 tablet (1,000 mg total) by mouth daily., Disp: 90 tablet, Rfl: 3   ketoconazole (NIZORAL) 200 MG tablet, Take 1 tablet (200 mg total) by mouth daily., Disp: 30 tablet, Rfl: 1  EXAM:  VITALS per patient if applicable:  GENERAL: alert, oriented, appears well and in no acute distress  HEENT: atraumatic, conjunttiva  clear, no obvious abnormalities on inspection of external nose and ears  NECK: normal movements of the head and neck  LUNGS: on inspection no signs of respiratory distress, breathing rate appears normal, no obvious gross SOB, gasping or wheezing  CV: no obvious cyanosis  MS: moves all visible extremities without noticeable abnormality  PSYCH/NEURO: pleasant and cooperative, no obvious depression or anxiety, speech and thought processing grossly intact  ASSESSMENT AND PLAN: Ringworm, she will take oral Ketoconazole daily for 30 days. [name], MD  Discussed the following assessment and plan:  No diagnosis found.     I discussed the assessment and treatment plan with the patient. The patient was provided an opportunity to ask questions and all were answered. The patient agreed with the plan and demonstrated an understanding of the instructions.   The patient was advised to call back or seek an in-person evaluation if the symptoms worsen or if the condition fails to improve as anticipated.      Review of Systems     Objective:   Physical Exam        Assessment & Plan:

## 2022-02-02 ENCOUNTER — Other Ambulatory Visit: Payer: Self-pay

## 2022-02-02 ENCOUNTER — Encounter (HOSPITAL_BASED_OUTPATIENT_CLINIC_OR_DEPARTMENT_OTHER): Payer: Self-pay | Admitting: Emergency Medicine

## 2022-02-02 DIAGNOSIS — Z5321 Procedure and treatment not carried out due to patient leaving prior to being seen by health care provider: Secondary | ICD-10-CM | POA: Insufficient documentation

## 2022-02-02 DIAGNOSIS — T7840XA Allergy, unspecified, initial encounter: Secondary | ICD-10-CM | POA: Insufficient documentation

## 2022-02-02 DIAGNOSIS — R21 Rash and other nonspecific skin eruption: Secondary | ICD-10-CM | POA: Insufficient documentation

## 2022-02-02 DIAGNOSIS — L299 Pruritus, unspecified: Secondary | ICD-10-CM | POA: Diagnosis not present

## 2022-02-02 NOTE — ED Triage Notes (Signed)
Started new med ketoconazole.  Now has red itchy rash to arms, torso, legs and headache  Airway clear, denies sob,   Took aleve, tylenol and benadryl '50mg'$  about 11pm.

## 2022-02-03 ENCOUNTER — Encounter: Payer: Self-pay | Admitting: Family Medicine

## 2022-02-03 ENCOUNTER — Emergency Department (HOSPITAL_BASED_OUTPATIENT_CLINIC_OR_DEPARTMENT_OTHER)
Admission: EM | Admit: 2022-02-03 | Discharge: 2022-02-03 | Discharge status: Against Medical Advice | Payer: No Typology Code available for payment source | Attending: Emergency Medicine | Admitting: Emergency Medicine

## 2022-02-04 ENCOUNTER — Ambulatory Visit (INDEPENDENT_AMBULATORY_CARE_PROVIDER_SITE_OTHER): Payer: No Typology Code available for payment source | Admitting: Family Medicine

## 2022-02-04 ENCOUNTER — Encounter: Payer: Self-pay | Admitting: Family Medicine

## 2022-02-04 VITALS — BP 100/70 | HR 59 | Temp 97.6°F | Ht 67.0 in | Wt 187.3 lb

## 2022-02-04 DIAGNOSIS — T50905A Adverse effect of unspecified drugs, medicaments and biological substances, initial encounter: Secondary | ICD-10-CM

## 2022-02-04 DIAGNOSIS — Z79899 Other long term (current) drug therapy: Secondary | ICD-10-CM | POA: Diagnosis not present

## 2022-02-04 NOTE — Progress Notes (Signed)
Established Patient Office Visit  Subjective   Patient ID: Laura [name], female    DOB: [birth year]  Age: 46 y.o. MRN: [id number]  Chief Complaint  Patient presents with   Medication Reaction    Patient reported she had reaction to Ketoconazole    HPI   Patient had sent message earlier today concerned that she may be having an adverse drug reaction to ketoconazole.  She has a few cats which were diagnosed with ringworm.  Patient had right facial rash and recently went to see dermatologist and was given topical antifungal which she thinks was Naftin.  Her facial rash is clearing.  She was placed on ketoconazole and took first dose on Saturday.  She had spent considerable amount of time outdoors Saturday cleaning out garage and was in the heat much of the day.  She did develop some headache later in the day Saturday and wondered if some of this may have been heat related.  She took some Tylenol.  Later that night she and her husband went to a tennis event over [place].  She noticed that her urine was very dark-colored.  She also broke out in a rash later Saturday basically head to toe.  This was erythematous.  She was concerned she was having a reaction to the ketoconazole.  She was very well-hydrated on Saturday.  Has not noted any light-colored stool.  No nausea or vomiting.  No abdominal pain.  Urine and stool color has been normal today.  She has not taken any further doses of ketoconazole since Saturday  Past Medical History:  Diagnosis Date   Anemia    Anxiety    Depression    GERD (gastroesophageal reflux disease)    sees Dr. Ludwig Lean at [place]    sees Dr. Jodi Mourning in [place])    Hypothyroidism    sees Dr. Malachy Mood in [place]    IBS (irritable bowel syndrome)    sees Dr. Eusebio Friendly in [place]    Insomnia    Pituitary microadenoma with hyperprolactinemia [facility])    sees Dr. Mare Ferrari in [facility]   Restless  legs    Thyroid disease    UTI (lower urinary tract infection)    sees Dr Karsten Ro   Past Surgical History:  Procedure Laterality Date   ABDOMINAL HYSTERECTOMY  Nov 2008   COLONOSCOPY  08-01-14   per Dr. Cindee Salt, tubular adenoma, repeat in 5 yrs    ESOPHAGOGASTRODUODENOSCOPY  08-01-14   per Dr. Cindee Salt, gastritis only    [place]   L side lymph node removal   TONSILLECTOMY     TUBAL LIGATION     WISDOM TOOTH EXTRACTION      reports that she has never smoked. She has never used smokeless tobacco. She reports that she does not drink alcohol and does not use drugs. family history includes Alcohol abuse in her father; Aneurysm in her paternal grandfather; Arrhythmia in her mother; Brain cancer in her brother; Coronary artery disease in her maternal grandmother; Diabetes in her maternal grandfather; Hypertension in her father, maternal grandfather, maternal grandmother, and mother; Thyroid disease in her mother. Allergies  Allergen Reactions   Iodinated Contrast Media Shortness Of Breath and Other (See Comments)    Sneezing, too   Magnesium Sulfate Shortness Of Breath   Penicillins Hives   Dilaudid [Hydromorphone Hcl]    Iohexol Other (See Comments)    Upper respiratory (sneezing) (IV contrast )  Needs 13-hour prep   Ketoconazole Other (See Comments)    rash   Levofloxacin Rash    Review of Systems  Constitutional:  Negative for chills and fever.  Gastrointestinal:  Negative for abdominal pain, nausea and vomiting.  Genitourinary:  Negative for hematuria.      Objective:     BP 100/70 (BP Location: Left Arm, Patient Position: Sitting, Cuff Size: Normal)   Pulse (!) 59   Temp 97.6 F (36.4 C) (Oral)   Ht '5\' 7"'$  (1.702 m)   Wt 187 lb 4.8 oz (85 kg)   SpO2 99%   BMI 29.34 kg/m    Physical Exam Vitals reviewed.  Constitutional:      Appearance: Normal appearance.  Eyes:     Comments: No scleral icterus  Abdominal:     Comments: Soft and nontender.  No  hepatomegaly noted.  Musculoskeletal:     Cervical back: Neck supple.  Skin:    Coloration: Skin is not jaundiced.  Neurological:     Mental Status: She is alert.      No results found for any visits on 02/04/22.    The 10-year ASCVD risk score (Arnett DK, et al., 2019) is: 0.4%    Assessment & Plan:   Problem List Items Addressed This Visit   None Visit Diagnoses     High risk medication use    -  Primary   Relevant Orders   Hepatic function panel     Patient had recent rash after taking 1 dose of ketoconazole.  She had concerns because her urine was very dark.  The dark urine mighta been related to being out in the heat for hours on Saturday and relative dehydration though it sounds like she was conscious to drink plenty.  She does not describe any abdominal pain, vomiting, light-colored stools, etc.  No evidence for jaundice on exam.  -Stop ketoconazole -Check hepatic panel -Continue with topical antifungal  No follow-ups on file.    Carolann Littler, MD

## 2022-02-05 LAB — HEPATIC FUNCTION PANEL
ALT: 16 U/L (ref 0–35)
AST: 22 U/L (ref 0–37)
Albumin: 4.3 g/dL (ref 3.5–5.2)
Alkaline Phosphatase: 54 U/L (ref 39–117)
Bilirubin, Direct: 0.1 mg/dL (ref 0.0–0.3)
Total Bilirubin: 0.3 mg/dL (ref 0.2–1.2)
Total Protein: 7.3 g/dL (ref 6.0–8.3)

## 2022-03-27 ENCOUNTER — Encounter: Payer: Self-pay | Admitting: Family Medicine

## 2022-03-27 DIAGNOSIS — E538 Deficiency of other specified B group vitamins: Secondary | ICD-10-CM

## 2022-03-27 DIAGNOSIS — E559 Vitamin D deficiency, unspecified: Secondary | ICD-10-CM

## 2022-03-28 NOTE — Telephone Encounter (Signed)
I placed the orders, so she can make a lab appt

## 2022-03-29 ENCOUNTER — Other Ambulatory Visit (INDEPENDENT_AMBULATORY_CARE_PROVIDER_SITE_OTHER): Payer: No Typology Code available for payment source

## 2022-03-29 DIAGNOSIS — E559 Vitamin D deficiency, unspecified: Secondary | ICD-10-CM | POA: Diagnosis not present

## 2022-03-29 DIAGNOSIS — E538 Deficiency of other specified B group vitamins: Secondary | ICD-10-CM

## 2022-03-29 LAB — VITAMIN B12: Vitamin B-12: 334 pg/mL (ref 211–911)

## 2022-03-29 LAB — VITAMIN D 25 HYDROXY (VIT D DEFICIENCY, FRACTURES): VITD: 47.55 ng/mL (ref 30.00–100.00)

## 2022-04-04 ENCOUNTER — Other Ambulatory Visit: Payer: Self-pay | Admitting: Family Medicine

## 2022-04-04 DIAGNOSIS — F418 Other specified anxiety disorders: Secondary | ICD-10-CM

## 2022-04-17 ENCOUNTER — Encounter: Payer: Self-pay | Admitting: Family Medicine

## 2022-04-18 NOTE — Telephone Encounter (Signed)
Take 1/2 a tablet (10 mg) daily for 2 weeks, then stop it

## 2022-04-25 ENCOUNTER — Other Ambulatory Visit: Payer: Self-pay | Admitting: Family Medicine

## 2022-07-01 ENCOUNTER — Other Ambulatory Visit: Payer: Self-pay | Admitting: Family Medicine

## 2022-07-01 DIAGNOSIS — F418 Other specified anxiety disorders: Secondary | ICD-10-CM

## 2022-09-12 ENCOUNTER — Other Ambulatory Visit: Payer: Self-pay | Admitting: Family Medicine

## 2022-09-12 DIAGNOSIS — F418 Other specified anxiety disorders: Secondary | ICD-10-CM

## 2022-12-11 ENCOUNTER — Other Ambulatory Visit: Payer: Self-pay | Admitting: Family Medicine

## 2022-12-11 DIAGNOSIS — F418 Other specified anxiety disorders: Secondary | ICD-10-CM

## 2023-01-03 ENCOUNTER — Encounter: Payer: Self-pay | Admitting: Family Medicine

## 2023-01-06 NOTE — Telephone Encounter (Signed)
We can increase the Wellbutrin XL to 300 mg daily. Call in #30 with 2 rf

## 2023-01-07 MED ORDER — BUPROPION HCL ER (XL) 300 MG PO TB24: 300.0000 mg | ORAL_TABLET | Freq: Every day | ORAL | 2 refills | Status: DC

## 2023-02-01 ENCOUNTER — Other Ambulatory Visit: Payer: Self-pay | Admitting: Family Medicine

## 2023-02-26 ENCOUNTER — Ambulatory Visit: Payer: No Typology Code available for payment source | Admitting: Sports Medicine

## 2023-03-04 ENCOUNTER — Encounter: Payer: Self-pay | Admitting: Sports Medicine

## 2023-03-04 ENCOUNTER — Ambulatory Visit: Payer: No Typology Code available for payment source | Admitting: Sports Medicine

## 2023-03-04 ENCOUNTER — Ambulatory Visit: Payer: No Typology Code available for payment source

## 2023-03-04 DIAGNOSIS — M25562 Pain in left knee: Secondary | ICD-10-CM

## 2023-03-04 DIAGNOSIS — M25561 Pain in right knee: Secondary | ICD-10-CM | POA: Diagnosis not present

## 2023-03-04 DIAGNOSIS — M2240 Chondromalacia patellae, unspecified knee: Secondary | ICD-10-CM

## 2023-03-04 MED ORDER — DICLOFENAC SODIUM 75 MG PO TBEC
75.0000 mg | DELAYED_RELEASE_TABLET | Freq: Two times a day (BID) | ORAL | 3 refills | Status: DC
Start: 2023-03-04 — End: 2023-07-07

## 2023-03-04 NOTE — Assessment & Plan Note (Addendum)
Pleasant 47 year old female, she has done some increasing walking, more frequently she is getting increasing pain behind both kneecaps, worse with going down stairs, kneeling, squatting, she also has some posterior pain on the right. On exam she has patellar crepitus, pain on the right with terminal flexion consistent with a meniscal injury. She has no effusion, otherwise good strength particularly from a hip abductor standpoint. Adding x-rays, home PT, McConnell taping, Voltaren. Return to see me in 6 weeks, MRI and injections if not better.

## 2023-03-04 NOTE — Patient Instructions (Signed)
Look into McConnell taping on youtube

## 2023-03-04 NOTE — Progress Notes (Signed)
    Procedures performed today:    None.  Independent interpretation of notes and tests performed by another provider:   None.  Brief History, Exam, Impression, and Recommendations:    Chondromalacia of patellofemoral joints Pleasant 47 year old female, she has done some increasing walking, more frequently she is getting increasing pain behind both kneecaps, worse with going down stairs, kneeling, squatting, she also has some posterior pain on the right. On exam she has patellar crepitus, pain on the right with terminal flexion consistent with a meniscal injury. She has no effusion, otherwise good strength particularly from a hip abductor standpoint. Adding x-rays, home PT, McConnell taping, Voltaren. Return to see me in 6 weeks, MRI and injections if not better.    ____________________________________________ Ihor Austin. Benjamin Stain, M.D., ABFM., CAQSM., AME. Primary Care and Sports Medicine [facility]  Adjunct Professor of Family Medicine  [place] [employer] [employer]  Restaurant manager, fast food

## 2023-03-07 ENCOUNTER — Other Ambulatory Visit: Payer: Self-pay | Admitting: Family Medicine

## 2023-04-12 ENCOUNTER — Other Ambulatory Visit: Payer: Self-pay | Admitting: Family Medicine

## 2023-04-15 ENCOUNTER — Ambulatory Visit: Payer: No Typology Code available for payment source | Admitting: Sports Medicine

## 2023-04-15 ENCOUNTER — Encounter: Payer: Self-pay | Admitting: Sports Medicine

## 2023-04-15 DIAGNOSIS — M2240 Chondromalacia patellae, unspecified knee: Secondary | ICD-10-CM

## 2023-04-15 MED ORDER — BUPROPION HCL ER (XL) 300 MG PO TB24: 300.0000 mg | ORAL_TABLET | Freq: Every day | ORAL | 0 refills | Status: DC

## 2023-04-15 MED ORDER — ACETAMINOPHEN ER 650 MG PO TBCR: 650.0000 mg | EXTENDED_RELEASE_TABLET | Freq: Three times a day (TID) | ORAL | Status: AC | PRN

## 2023-04-15 MED ORDER — DICLOFENAC SODIUM 1 % EX GEL: 4.0000 g | Freq: Four times a day (QID) | CUTANEOUS | 11 refills | Status: AC

## 2023-04-15 NOTE — Progress Notes (Signed)
    Procedures performed today:    None.  Independent interpretation of notes and tests performed by another provider:   None.  Brief History, Exam, Impression, and Recommendations:    Chondromalacia of patellofemoral joints Pleasant 47 year old female, patellofemoral osteoarthritis, anterior knee pain is much better with home physical therapy, McConnell taping, oral Voltaren, still has some pain posterior lateral right knee, she does have some discomfort with terminal flexion with a McMurray's sign that reproduces pain but not a pop on the right side. Discussed the natural history of osteoarthritis and degenerative meniscal tearing as well as patellofemoral chondromalacia, she will continue her oral Voltaren as needed, continue home conditioning, adding arthritis from Tylenol and topical Voltaren, discussed avoiding deep knee bending, high impact and twisting motions, she can return to see me as needed for injection.    ____________________________________________ Ihor Austin. Benjamin Stain, M.D., ABFM., CAQSM., AME. Primary Care and Sports Medicine [facility]  Adjunct Professor of Family Medicine  [place] [employer] [facility] [employer]  Restaurant manager, fast food

## 2023-04-15 NOTE — Assessment & Plan Note (Signed)
Pleasant 47 year old female, patellofemoral osteoarthritis, anterior knee pain is much better with home physical therapy, McConnell taping, oral Voltaren, still has some pain posterior lateral right knee, she does have some discomfort with terminal flexion with a McMurray's sign that reproduces pain but not a pop on the right side. Discussed the natural history of osteoarthritis and degenerative meniscal tearing as well as patellofemoral chondromalacia, she will continue her oral Voltaren as needed, continue home conditioning, adding arthritis from Tylenol and topical Voltaren, discussed avoiding deep knee bending, high impact and twisting motions, she can return to see me as needed for injection.

## 2023-04-30 LAB — HM MAMMOGRAPHY

## 2023-06-08 ENCOUNTER — Other Ambulatory Visit: Payer: Self-pay | Admitting: Family Medicine

## 2023-06-17 ENCOUNTER — Ambulatory Visit: Payer: No Typology Code available for payment source | Admitting: Sports Medicine

## 2023-06-17 ENCOUNTER — Other Ambulatory Visit (INDEPENDENT_AMBULATORY_CARE_PROVIDER_SITE_OTHER): Payer: No Typology Code available for payment source

## 2023-06-17 ENCOUNTER — Ambulatory Visit: Payer: No Typology Code available for payment source

## 2023-06-17 DIAGNOSIS — M5416 Radiculopathy, lumbar region: Secondary | ICD-10-CM

## 2023-06-17 DIAGNOSIS — M1909 Primary osteoarthritis, other specified site: Secondary | ICD-10-CM | POA: Diagnosis not present

## 2023-06-17 NOTE — Assessment & Plan Note (Signed)
 Very pleasant 48 year old female, 6 weeks of pain left-sided low back after moving house. Pain is directly over the sacroiliac joint. She has tried some oral analgesics and activity modification without improvement. Nothing radicular. I explained the possibility that this was sacroiliac joint pathology, facet pathology, disc pathology. We injected her sacroiliac joint for diagnostic and therapeutic purposes, she did experience concordant pain during the injection, adding SI joint x-rays, lumbar spine x-rays and SI joint home physical therapy, return to see me in 6 weeks, we will MRI her lumbar spine if not sufficiently better.

## 2023-06-17 NOTE — Progress Notes (Signed)
    Procedures performed today:    Procedure: Real-time Ultrasound Guided injection of the left sacroiliac joint Device: Samsung HS60  Verbal informed consent obtained.  Time-out conducted.  Noted no overlying erythema, induration, or other signs of local infection.  Skin prepped in a sterile fashion.  Local anesthesia: Topical Ethyl chloride.  With sterile technique and under real time ultrasound guidance: 1 cc Kenalog  40, 2 cc lidocaine, 2 cc bupivacaine injected easily Completed without difficulty  Advised to call if fevers/chills, erythema, induration, drainage, or persistent bleeding.  Images permanently stored and available for review in PACS.  Impression: Technically successful ultrasound guided injection.  Independent interpretation of notes and tests performed by another provider:   None.  Brief History, Exam, Impression, and Recommendations:    Lumbar radiculopathy Very pleasant 48 year old female, 6 weeks of pain left-sided low back after moving house. Pain is directly over the sacroiliac joint. She has tried some oral analgesics and activity modification without improvement. Nothing radicular. I explained the possibility that this was sacroiliac joint pathology, facet pathology, disc pathology. We injected her sacroiliac joint for diagnostic and therapeutic purposes, she did experience concordant pain during the injection, adding SI joint x-rays, lumbar spine x-rays and SI joint home physical therapy, return to see me in 6 weeks, we will MRI her lumbar spine if not sufficiently better.    ____________________________________________ Debby PARAS. Curtis, M.D., ABFM., CAQSM., AME. Primary Care and Sports Medicine [facility]  Adjunct Professor of [facility] Medicine  [employer]  Restaurant Manager, [employer]

## 2023-06-19 ENCOUNTER — Encounter: Payer: Self-pay | Admitting: Sports Medicine

## 2023-07-05 ENCOUNTER — Other Ambulatory Visit: Payer: Self-pay | Admitting: Sports Medicine

## 2023-07-05 DIAGNOSIS — M2240 Chondromalacia patellae, unspecified knee: Secondary | ICD-10-CM

## 2023-07-13 ENCOUNTER — Other Ambulatory Visit: Payer: Self-pay | Admitting: Family Medicine

## 2023-07-30 ENCOUNTER — Ambulatory Visit: Payer: No Typology Code available for payment source

## 2023-07-30 ENCOUNTER — Ambulatory Visit: Payer: No Typology Code available for payment source | Admitting: Sports Medicine

## 2023-07-30 ENCOUNTER — Encounter: Payer: Self-pay | Admitting: Sports Medicine

## 2023-07-30 DIAGNOSIS — M65319 Trigger thumb, unspecified thumb: Secondary | ICD-10-CM

## 2023-07-30 DIAGNOSIS — M79644 Pain in right finger(s): Secondary | ICD-10-CM

## 2023-07-30 MED ORDER — IBUPROFEN 800 MG PO TABS: 800.0000 mg | ORAL_TABLET | Freq: Three times a day (TID) | ORAL | 2 refills | Status: AC | PRN

## 2023-07-30 NOTE — Assessment & Plan Note (Signed)
 Pleasant 48 year old female, she is a bowler, for the past several weeks she has had increasing pain volar aspect of the right thumb. On exam she has good motion and strength, she does have a palpable flexor nodule at the flexor pollicis longus proximal to the A1 pulley. Palpation at the [facility] and MCP is normal. I suspect you are dealing with more of a stenosing flexor tenosynovitis. We will start conservative, ibuprofen 800 mg 3 times daily, x-rays and home PT, return to see me in 6 weeks, injection if not better.

## 2023-07-30 NOTE — Progress Notes (Signed)
    Procedures performed today:    None.  Independent interpretation of notes and tests performed by another provider:   None.  Brief History, Exam, Impression, and Recommendations:    Stenosing tenosynovitis of right thumb Pleasant 48 year old female, she is a bowler, for the past several weeks she has had increasing pain volar aspect of the right thumb. On exam she has good motion and strength, she does have a palpable flexor nodule at the flexor pollicis longus proximal to the A1 pulley. Palpation at the [facility] and [facility] is normal. I suspect you are dealing with more of a stenosing flexor tenosynovitis. We will start conservative, ibuprofen 800 mg 3 times daily, x-rays and home PT, return to see me in 6 weeks, injection if not better.    ____________________________________________ Ihor Austin. Benjamin Stain, M.D., ABFM., CAQSM., AME. Primary Care and Sports Medicine [facility]  Adjunct Professor of Family Medicine  [place] of [facility] of Medicine  Restaurant manager, fast food

## 2023-09-12 ENCOUNTER — Ambulatory Visit: Payer: No Typology Code available for payment source | Admitting: Sports Medicine

## 2023-09-15 ENCOUNTER — Ambulatory Visit: Payer: No Typology Code available for payment source | Admitting: Sports Medicine

## 2023-10-13 ENCOUNTER — Telehealth: Admitting: Family Medicine

## 2024-01-23 ENCOUNTER — Other Ambulatory Visit: Payer: Self-pay | Admitting: Medical Genetics

## 2024-02-10 ENCOUNTER — Encounter: Payer: Self-pay | Admitting: Sports Medicine

## 2024-07-03 ENCOUNTER — Other Ambulatory Visit: Payer: Self-pay | Admitting: Family Medicine

## 2024-07-14 ENCOUNTER — Telehealth: Admitting: Family Medicine

## 2024-07-14 ENCOUNTER — Ambulatory Visit: Payer: Self-pay

## 2024-07-14 VITALS — Ht 67.0 in

## 2024-07-14 DIAGNOSIS — Z634 Disappearance and death of family member: Secondary | ICD-10-CM | POA: Insufficient documentation

## 2024-07-14 DIAGNOSIS — F418 Other specified anxiety disorders: Secondary | ICD-10-CM

## 2024-07-14 MED ORDER — BUPROPION HCL ER (XL) 300 MG PO TB24: 300.0000 mg | ORAL_TABLET | Freq: Every day | ORAL | 3 refills | Status: AC

## 2024-07-14 MED ORDER — MIRTAZAPINE 15 MG PO TABS: 15.0000 mg | ORAL_TABLET | Freq: Every day | ORAL | 2 refills | Status: AC

## 2024-07-14 NOTE — Telephone Encounter (Signed)
 FYI Only or Action Required?: FYI only for provider: appointment scheduled on 2/4.  Patient was last seen in primary care on 07/30/2023 by Curtis Debby PARAS, MD.  Called Nurse Triage reporting Anxiety, Panic Attack, and Grief.  Symptoms began several weeks ago.  Interventions attempted: Prescription medications: ran out of wellbutrin  2-3 weeks ago and Rest, hydration, or home remedies.  Symptoms are: gradually worsening.  Triage Disposition: See PCP When Office is Open (Within 3 Days)  Patient/caregiver understands and will follow disposition?: Yes      Reason for Disposition  Recent traumatic event (e.g., death of a loved one, job loss, victim/witness of crime)  Answer Assessment - Initial Assessment Questions Pt called requesting refill of wellbutrin  since acutely having increased anxiety with recent loss of her daughter 2.5 weeks ago, pt stated that she ran out of wellbutrin  2-3 weeks ago, unsure if had anyone else prescribing it besides Dr. Johnny. Pt states she's not sure why Dr. ONEIDA has been listed as her PCP, as he was just her sports med doc, still wanted to keep Dr. Johnny as PCP, saw Dr. Johnny last year. Pt requesting to have Dr. Johnny as PCP. Scheduled pt for virtual appt today with Dr. Johnny per pt preference, advised pt go to ED if unable to come out of panic attack like usual, call back if any new or worsening symptoms.      1. CONCERN: Did anything happen that prompted you to call today?      Pt daughter passed away 2.5 weeks ago  2. ANXIETY SYMPTOMS: Can you describe how you (your loved one; patient) have been feeling? (e.g., tense, restless, panicky, anxious, keyed up, overwhelmed, sense of impending doom).      Panic attacks, confirms extremely anxious with intense emotional symptoms such as feeling of unreality, urge to flee, unable to calm down; unable to cope or function  3. ONSET: How long have you been feeling this way? (e.g., hours, days, weeks)     2.5  weeks  4. SEVERITY: How would you rate the level of anxiety? (e.g., 0 - 10; or mild, moderate, severe).     Severe  5. FUNCTIONAL IMPAIRMENT: How have these feelings affected your ability to do daily activities? Have you had more difficulty than usual doing your normal daily activities? (e.g., getting better, same, worse; self-care, school, work, interactions)     Hard to function  6. HISTORY: Have you felt this way before? Have you ever been diagnosed with an anxiety problem in the past? (e.g., generalized anxiety disorder, panic attacks, PTSD). If Yes, ask: How was this problem treated? (e.g., medicines, counseling, etc.)     Panic attacks and anxiety in the past  7. RISK OF HARM - SUICIDAL IDEATION: Do you ever have thoughts of hurting or killing yourself? If Yes, ask:  Do you have these feelings now? Do you have a plan on how you would do this?     Denies  11. PATIENT SUPPORT: Who is with you now? Who do you live with? Do you have family or friends who you can talk to?        About to go have lunch with mom  12. OTHER SYMPTOMS: Do you have any other symptoms? (e.g., feeling depressed, trouble concentrating, trouble sleeping, trouble breathing, palpitations or fast heartbeat, chest pain, sweating, nausea, or diarrhea)       Feeling depressed Can't focus Can't function  Protocols used: Anxiety and Panic Attack-A-AH

## 2024-07-14 NOTE — Progress Notes (Signed)
 "  Subjective:    Patient ID: Laura [name], female    DOB: 1975/06/18, 49 y.o.   MRN: [id number]  HPI Virtual Visit via Video Note  I connected with the patient on 07/14/24 at  2:45 PM EST by a video enabled telemedicine application and verified that I am speaking with the correct person using two identifiers.  Location patient: home Location provider:work or home office Persons participating in the virtual visit: patient, provider  I discussed the limitations of evaluation and management by telemedicine and the availability of in person appointments. The patient expressed understanding and agreed to proceed.   HPI: Here to discuss her current struggles with dealing with the recent death of her 33 year old daughter. Her daughter had been diagnosed with alveolar rhabdomyosarcoma about 4 years ago, and it seemed to respond to treatment. Then a month ago it was discovered that this had metastasized to her lung, making recovery almost impossible. She then passed away on 07-07-2024. This has been devastating to Ravonda because she had always been very close to her daughter. Since then she has been very depressed, angry, tearful, and wanting to seclude herself from everyone. She has been taking Wellbutrin  XL for years, and she wants to continue this, but she asks if there is anything else she can try. Her GYN gave her a supply of Lorazepam , and she has taken a few of these to help her sleep. Her appetite is quite poor. She had already been working with a therapist, and since her daughter's death she has been meeting with them weekly. She has tried Celexa  and Buspar in the past with mixed results, but her ophthalmologist has told her to avoid all SSRI medications because they could worsen her borderline glaucoma.    ROS: See pertinent positives and negatives per HPI.  Past Medical History:  Diagnosis Date   Anemia    Anxiety    Depression    GERD (gastroesophageal reflux disease)    sees Dr. Medford Bowers at [facility]    Glaucoma    sees Dr. Rudy in [place])    Hypothyroidism    sees Dr. Daine Creed in [place]    IBS (irritable bowel syndrome)    sees Dr. Lonni Bowers in [place]    Insomnia    Pituitary microadenoma with hyperprolactinemia [facility])    sees Dr. Creed in [facility]   Restless legs    Thyroid  disease    UTI (lower urinary tract infection)    sees Dr Ottelin    Past Surgical History:  Procedure Laterality Date   ABDOMINAL HYSTERECTOMY  Nov 2008   COLONOSCOPY  08-01-14   per Dr. Bowers, tubular adenoma, repeat in 5 yrs    ESOPHAGOGASTRODUODENOSCOPY  08-01-14   per Dr. Bowers, gastritis only    NECK SURGERY  JULY 1996   L side lymph node removal   TONSILLECTOMY     TUBAL LIGATION     WISDOM TOOTH EXTRACTION      Family History  Problem Relation Age of Onset   Alcohol abuse Father    Hypertension Father    Hypertension Mother    Thyroid  disease Mother        maternal grandmother   Arrhythmia Mother    Coronary artery disease Maternal Grandmother        paternal grandmother   Hypertension Maternal Grandmother    Diabetes Maternal Grandfather    Hypertension Maternal Grandfather    Aneurysm Paternal Grandfather  aortic   Brain cancer Brother    Colon cancer Neg Hx    Esophageal cancer Neg Hx    Stomach cancer Neg Hx     Current Medications[1]  EXAM:  VITALS per patient if applicable:  GENERAL: alert, oriented, appears well and in no acute distress  HEENT: atraumatic, conjunttiva clear, no obvious abnormalities on inspection of external nose and ears  NECK: normal movements of the head and neck  LUNGS: on inspection no signs of respiratory distress, breathing rate appears normal, no obvious gross SOB, gasping or wheezing  CV: no obvious cyanosis  MS: moves all visible extremities without noticeable abnormality  PSYCH/NEURO: pleasant and cooperative, no obvious depression or anxiety, speech and  thought processing grossly intact  ASSESSMENT AND PLAN: Grief reaction on top of chronic depression and anxiety. She will stay on Wellbutrin  XL 300 mg daily,and we will add Mirtazapine  15 mg every night at bedtime. She can add Lorazepam  as needed. Follow up with us  in 3-4 weeks. I personally spent a total of 35 minutes in the care of the patient today including getting/reviewing separately obtained history, counseling and educating, and placing orders.  Garnette Olmsted, MD  Discussed the following assessment and plan:  Depression with anxiety     I discussed the assessment and treatment plan with the patient. The patient was provided an opportunity to ask questions and all were answered. The patient agreed with the plan and demonstrated an understanding of the instructions.   The patient was advised to call back or seek an in-person evaluation if the symptoms worsen or if the condition fails to improve as anticipated.      Review of Systems     Objective:   Physical Exam        Assessment & Plan:      [1]  Current Outpatient Medications:    acetaminophen  (TYLENOL ) 650 MG CR tablet, Take 1 tablet (650 mg total) by mouth every 8 (eight) hours as needed for pain., Disp: , Rfl:    cabergoline (DOSTINEX) 0.5 MG tablet, Take 0.25 mg by mouth 2 (two) times a week. , Disp: , Rfl:    diclofenac  (VOLTAREN ) 75 MG EC tablet, TAKE 1 TABLET BY MOUTH TWICE A DAY, Disp: 60 tablet, Rfl: 1   diclofenac  Sodium (VOLTAREN ) 1 % GEL, Apply 4 g topically 4 (four) times daily. To affected joint., Disp: 100 g, Rfl: 11   doxycycline (VIBRAMYCIN) 100 MG capsule, Take 100 mg by mouth daily. Takes for Acne, Disp: , Rfl:    ibuprofen  (ADVIL ) 800 MG tablet, Take 1 tablet (800 mg total) by mouth every 8 (eight) hours as needed., Disp: 90 tablet, Rfl: 2   Levothyroxine Sodium 75 MCG CAPS, Take by mouth daily before breakfast. Takes 2 tablets on Mondays and Fridays, Disp: , Rfl: 0   loteprednol (LOTEMAX) 0.5 %  ophthalmic suspension, 1 drop 2 (two) times daily., Disp: , Rfl:    mirtazapine  (REMERON ) 15 MG tablet, Take 1 tablet (15 mg total) by mouth at bedtime., Disp: 30 tablet, Rfl: 2   neomycin -polymyxin b-dexamethasone (MAXITROL) 3.5-10000-0.1 OINT, APPLY TO EYELID BOTH EYES TWICE A DAY FOR 2 WEEKS, Disp: 3.5 g, Rfl: 0   PAZEO 0.7 % SOLN, USE 1 DROP INTO BOTH EYES ONCE A DAY, Disp: , Rfl:    RESTASIS MULTIDOSE 0.05 % ophthalmic emulsion, , Disp: , Rfl:    valACYclovir  (VALTREX ) 1000 MG tablet, Take 1 tablet (1,000 mg total) by mouth daily., Disp: 90 tablet, Rfl: 3  buPROPion  (WELLBUTRIN  XL) 300 MG 24 hr tablet, Take 1 tablet (300 mg total) by mouth daily., Disp: 90 tablet, Rfl: 3  "
